# Patient Record
Sex: Female | Born: 1955
Health system: Southern US, Community
[De-identification: ages and names within clinical notes are randomized; demographics above are authoritative.]

## PROBLEM LIST (undated history)

## (undated) DIAGNOSIS — R5383 Other fatigue: Secondary | ICD-10-CM

## (undated) DIAGNOSIS — M8580 Other specified disorders of bone density and structure, unspecified site: Secondary | ICD-10-CM

## (undated) DIAGNOSIS — F329 Major depressive disorder, single episode, unspecified: Secondary | ICD-10-CM

## (undated) DIAGNOSIS — L509 Urticaria, unspecified: Secondary | ICD-10-CM

## (undated) DIAGNOSIS — F32A Depression, unspecified: Secondary | ICD-10-CM

## (undated) DIAGNOSIS — R0600 Dyspnea, unspecified: Secondary | ICD-10-CM

## (undated) DIAGNOSIS — J40 Bronchitis, not specified as acute or chronic: Secondary | ICD-10-CM

## (undated) DIAGNOSIS — F419 Anxiety disorder, unspecified: Secondary | ICD-10-CM

## (undated) DIAGNOSIS — J45909 Unspecified asthma, uncomplicated: Secondary | ICD-10-CM

## (undated) DIAGNOSIS — M169 Osteoarthritis of hip, unspecified: Secondary | ICD-10-CM

## (undated) DIAGNOSIS — B977 Papillomavirus as the cause of diseases classified elsewhere: Secondary | ICD-10-CM

## (undated) DIAGNOSIS — R490 Dysphonia: Secondary | ICD-10-CM

## (undated) DIAGNOSIS — R87810 Cervical high risk human papillomavirus (HPV) DNA test positive: Secondary | ICD-10-CM

## (undated) DIAGNOSIS — R87612 Low grade squamous intraepithelial lesion on cytologic smear of cervix (LGSIL): Secondary | ICD-10-CM

## (undated) DIAGNOSIS — R519 Headache, unspecified: Secondary | ICD-10-CM

## (undated) DIAGNOSIS — Z8489 Family history of other specified conditions: Secondary | ICD-10-CM

## (undated) DIAGNOSIS — C3490 Malignant neoplasm of unspecified part of unspecified bronchus or lung: Secondary | ICD-10-CM

## (undated) DIAGNOSIS — K219 Gastro-esophageal reflux disease without esophagitis: Secondary | ICD-10-CM

## (undated) DIAGNOSIS — J449 Chronic obstructive pulmonary disease, unspecified: Secondary | ICD-10-CM

## (undated) DIAGNOSIS — C801 Malignant (primary) neoplasm, unspecified: Secondary | ICD-10-CM

## (undated) DIAGNOSIS — Z85118 Personal history of other malignant neoplasm of bronchus and lung: Secondary | ICD-10-CM

## (undated) DIAGNOSIS — Z9221 Personal history of antineoplastic chemotherapy: Secondary | ICD-10-CM

## (undated) HISTORY — DX: Cervical high risk human papillomavirus (HPV) DNA test positive: R87.810

## (undated) HISTORY — DX: Unspecified asthma, uncomplicated: J45.909

## (undated) HISTORY — DX: Major depressive disorder, single episode, unspecified: F32.9

## (undated) HISTORY — DX: Malignant neoplasm of unspecified part of unspecified bronchus or lung: C34.90

## (undated) HISTORY — DX: Osteoarthritis of hip, unspecified: M16.9

## (undated) HISTORY — DX: Other specified disorders of bone density and structure, unspecified site: M85.80

## (undated) HISTORY — DX: Urticaria, unspecified: L50.9

## (undated) HISTORY — DX: Low grade squamous intraepithelial lesion on cytologic smear of cervix (LGSIL): R87.612

## (undated) HISTORY — DX: Depression, unspecified: F32.A

## (undated) HISTORY — DX: Papillomavirus as the cause of diseases classified elsewhere: B97.7

## (undated) HISTORY — DX: Personal history of other malignant neoplasm of bronchus and lung: Z85.118

## (undated) HISTORY — DX: Chronic obstructive pulmonary disease, unspecified: J44.9

---

## 1995-02-26 HISTORY — PX: ABDOMINAL HYSTERECTOMY: SHX81

## 1997-02-25 HISTORY — PX: CHOLECYSTECTOMY: SHX55

## 2000-09-04 ENCOUNTER — Other Ambulatory Visit: Admission: RE | Admit: 2000-09-04 | Discharge: 2000-09-04 | Payer: Self-pay | Admitting: Obstetrics and Gynecology

## 2000-09-12 ENCOUNTER — Encounter: Payer: Self-pay | Admitting: Obstetrics and Gynecology

## 2000-09-12 ENCOUNTER — Encounter: Admission: RE | Admit: 2000-09-12 | Discharge: 2000-09-12 | Payer: Self-pay | Admitting: Obstetrics and Gynecology

## 2001-09-22 ENCOUNTER — Encounter: Admission: RE | Admit: 2001-09-22 | Discharge: 2001-09-22 | Payer: Self-pay | Admitting: Obstetrics and Gynecology

## 2001-09-22 ENCOUNTER — Encounter: Payer: Self-pay | Admitting: Obstetrics and Gynecology

## 2002-02-25 DIAGNOSIS — C3490 Malignant neoplasm of unspecified part of unspecified bronchus or lung: Secondary | ICD-10-CM

## 2002-02-25 HISTORY — DX: Malignant neoplasm of unspecified part of unspecified bronchus or lung: C34.90

## 2002-02-25 HISTORY — PX: LUNG CANCER SURGERY: SHX702

## 2002-10-08 ENCOUNTER — Encounter: Admission: RE | Admit: 2002-10-08 | Discharge: 2002-10-08 | Payer: Self-pay | Admitting: Obstetrics and Gynecology

## 2002-10-08 ENCOUNTER — Encounter: Payer: Self-pay | Admitting: Obstetrics and Gynecology

## 2003-10-13 ENCOUNTER — Encounter: Admission: RE | Admit: 2003-10-13 | Discharge: 2003-10-13 | Payer: Self-pay | Admitting: Obstetrics and Gynecology

## 2004-10-18 ENCOUNTER — Encounter: Admission: RE | Admit: 2004-10-18 | Discharge: 2004-10-18 | Payer: Self-pay | Admitting: Obstetrics and Gynecology

## 2007-03-31 ENCOUNTER — Ambulatory Visit: Payer: Self-pay | Admitting: Gastroenterology

## 2007-03-31 LAB — HM COLONOSCOPY

## 2008-03-26 ENCOUNTER — Emergency Department: Payer: Self-pay | Admitting: Emergency Medicine

## 2010-04-17 ENCOUNTER — Emergency Department: Payer: Self-pay | Admitting: Emergency Medicine

## 2012-10-07 ENCOUNTER — Ambulatory Visit: Payer: Self-pay

## 2012-10-07 LAB — HM MAMMOGRAPHY

## 2012-10-13 LAB — HM PAP SMEAR

## 2014-06-16 ENCOUNTER — Other Ambulatory Visit: Payer: Self-pay | Admitting: Family Medicine

## 2014-06-16 DIAGNOSIS — M8580 Other specified disorders of bone density and structure, unspecified site: Secondary | ICD-10-CM

## 2014-06-16 DIAGNOSIS — N63 Unspecified lump in unspecified breast: Secondary | ICD-10-CM

## 2014-06-29 ENCOUNTER — Ambulatory Visit: Payer: Self-pay

## 2014-06-29 ENCOUNTER — Other Ambulatory Visit: Payer: Self-pay

## 2014-07-14 ENCOUNTER — Other Ambulatory Visit: Payer: Self-pay

## 2014-07-14 ENCOUNTER — Ambulatory Visit: Payer: Self-pay

## 2014-08-04 ENCOUNTER — Ambulatory Visit
Admission: RE | Admit: 2014-08-04 | Discharge: 2014-08-04 | Disposition: A | Discharge status: Home / Self Care | Payer: Managed Care, Other (non HMO) | Source: Ambulatory Visit | Attending: Family Medicine | Admitting: Family Medicine

## 2014-08-04 ENCOUNTER — Ambulatory Visit: Payer: Self-pay

## 2014-08-04 DIAGNOSIS — N63 Unspecified lump in unspecified breast: Secondary | ICD-10-CM

## 2014-08-04 DIAGNOSIS — M8580 Other specified disorders of bone density and structure, unspecified site: Secondary | ICD-10-CM | POA: Diagnosis present

## 2014-08-04 HISTORY — DX: Malignant (primary) neoplasm, unspecified: C80.1

## 2014-08-08 ENCOUNTER — Encounter: Payer: Self-pay | Admitting: Family Medicine

## 2014-08-08 DIAGNOSIS — M8580 Other specified disorders of bone density and structure, unspecified site: Secondary | ICD-10-CM | POA: Insufficient documentation

## 2014-08-09 ENCOUNTER — Telehealth: Payer: Self-pay | Admitting: Family Medicine

## 2014-08-09 NOTE — Telephone Encounter (Signed)
Please let [name] know that I'd like to see patient for an appointment here in the office for:  A clinical breast exam Please schedule a visit with me in 3 months Fasting?  no Thank you, Dr. Sanda Klein

## 2014-08-11 NOTE — Telephone Encounter (Signed)
I have left 2 vm to call back and schedule appt with Dr. Sanda Klein.

## 2014-11-16 DIAGNOSIS — Z85118 Personal history of other malignant neoplasm of bronchus and lung: Secondary | ICD-10-CM | POA: Insufficient documentation

## 2014-11-16 DIAGNOSIS — J449 Chronic obstructive pulmonary disease, unspecified: Secondary | ICD-10-CM | POA: Insufficient documentation

## 2014-11-16 DIAGNOSIS — F331 Major depressive disorder, recurrent, moderate: Secondary | ICD-10-CM | POA: Insufficient documentation

## 2014-11-16 DIAGNOSIS — J45909 Unspecified asthma, uncomplicated: Secondary | ICD-10-CM | POA: Insufficient documentation

## 2014-11-16 DIAGNOSIS — M169 Osteoarthritis of hip, unspecified: Secondary | ICD-10-CM | POA: Insufficient documentation

## 2014-11-16 DIAGNOSIS — L509 Urticaria, unspecified: Secondary | ICD-10-CM | POA: Insufficient documentation

## 2014-11-18 ENCOUNTER — Encounter: Payer: Self-pay | Admitting: Family Medicine

## 2014-11-18 ENCOUNTER — Ambulatory Visit (INDEPENDENT_AMBULATORY_CARE_PROVIDER_SITE_OTHER): Payer: Managed Care, Other (non HMO) | Admitting: Family Medicine

## 2014-11-18 VITALS — BP 147/82 | HR 80 | Temp 98.7°F | Ht 65.0 in | Wt 211.0 lb

## 2014-11-18 DIAGNOSIS — F321 Major depressive disorder, single episode, moderate: Secondary | ICD-10-CM

## 2014-11-18 DIAGNOSIS — M8580 Other specified disorders of bone density and structure, unspecified site: Secondary | ICD-10-CM | POA: Diagnosis not present

## 2014-11-18 DIAGNOSIS — E559 Vitamin D deficiency, unspecified: Secondary | ICD-10-CM | POA: Insufficient documentation

## 2014-11-18 DIAGNOSIS — Z23 Encounter for immunization: Secondary | ICD-10-CM | POA: Diagnosis not present

## 2014-11-18 DIAGNOSIS — L509 Urticaria, unspecified: Secondary | ICD-10-CM

## 2014-11-18 MED ORDER — ESCITALOPRAM OXALATE 10 MG PO TABS: 10.0000 mg | ORAL_TABLET | Freq: Every day | ORAL | Status: DC

## 2014-11-18 MED ORDER — TRAZODONE HCL 50 MG PO TABS: 50.0000 mg | ORAL_TABLET | Freq: Every evening | ORAL | Status: DC | PRN

## 2014-11-18 NOTE — Assessment & Plan Note (Signed)
Check vit D, TSH, vit B12; start medicine, start counseling, hand-out given from [facility]

## 2014-11-18 NOTE — Assessment & Plan Note (Signed)
On anti-histamine

## 2014-11-18 NOTE — Progress Notes (Signed)
BP 147/82 mmHg  Pulse 80  Temp(Src) 98.7 F (37.1 C)  Ht '5\' 5"'$  (1.651 m)  Wt 211 lb (95.709 kg)  BMI 35.11 kg/m2  SpO2 98%   Subjective:    Patient ID: Debra Mccann, female    DOB: Apr 14, 1955, 59 y.o.   MRN: [id number]  HPI: Debra Mccann is a 59 y.o. female  Chief Complaint  Patient presents with  . Depression  . Anxiety  . 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; working 50 hours a week; I asked about light at the end of the tunnel for the job and she says yes, they have gotten on to just one system, so some issues resolving but still stressful; she tries to stay a day ahead, worrying about the future; she is a Biochemist, clinical, worries about everything; she thinks there is someone at her work she can talk to about job stress She has never been on anything for depression or anxiety (medicine-wise) She feels like everything is falling down on her This is the first time she has felt like this No family history of depression No abuse at home No self-medicating No alcohol No constipation No well-meaning friends have been giving her things She eats chicken and fish, not much red meat Not much sunlight Hx of low vit D, actually had to take vit D Rx  Relevant past medical, surgical, family and social history reviewed and updated as indicated. Interim medical history since our last visit reviewed. Allergies and medications reviewed and updated.  Review of Systems Per HPI unless specifically indicated above     Objective:    BP 147/82 mmHg  Pulse 80  Temp(Src) 98.7 F (37.1 C)  Ht '5\' 5"'$  (1.651 m)  Wt 211 lb (95.709 kg)  BMI 35.11 kg/m2  SpO2 98%  Wt Readings from  Last 3 Encounters:  11/18/14 211 lb (95.709 kg)  06/10/14 210 lb (95.255 kg)    Physical Exam  Constitutional: She appears well-developed and well-nourished. No distress.  HENT:  Head: Normocephalic and atraumatic.  Eyes: EOM are normal. No scleral icterus.  Neck: No thyromegaly present.  Cardiovascular: Normal rate, regular rhythm and normal heart sounds.   No murmur heard. Pulmonary/Chest: Effort normal and breath sounds normal. No respiratory distress. She has no wheezes.  Abdominal: She exhibits no distension.  Musculoskeletal: Normal range of motion. She exhibits no edema.  Neurological: She is alert. She displays no tremor. She exhibits normal muscle tone.  Patellar DTRs trace  Skin: Skin is warm and dry. She is not diaphoretic. No pallor.  Psychiatric: Her speech is normal and behavior is normal. Judgment and thought content normal. Her mood appears anxious. Her affect is not blunt and not labile. Cognition and memory are normal. She exhibits a depressed mood.  Good eye contact; make-up (eyebrows), good hygiene, casually dressed   Results for orders placed or performed in visit on 11/16/14  HM MAMMOGRAPHY  Result Value Ref Range   HM Mammogram per PP   HM PAP SMEAR  Result Value Ref Range   HM Pap smear per PP   HM COLONOSCOPY  Result Value Ref Range  HM Colonoscopy per PP       Assessment & Plan:   Problem List Items Addressed This Visit      Musculoskeletal and Integument   Osteopenia    Check vit D; three servings of calcium a day, fall precautions      Relevant Orders   Vit D  25 hydroxy (rtn osteoporosis monitoring)   Urticaria    On anti-histamine        Other   Major depressive disorder, single episode, moderate with anxious distress - Primary    Check vit D, TSH, vit B12; start medicine, start counseling, hand-out given from [facility]      Relevant Medications   escitalopram (LEXAPRO) 10 MG tablet   traZODone (DESYREL) 50 MG tablet   Other  Relevant Orders   TSH   Vitamin B12   Vitamin D deficiency   Relevant Orders   Vit D  25 hydroxy (rtn osteoporosis monitoring)    Other Visit Diagnoses    Encounter for immunization          flu shot given today  Follow up plan: Return in about 3 weeks (around 12/09/2014) for medicine check-up.  An after-visit summary was printed and given to the patient at [place].  Please see the patient instructions which may contain other information and recommendations beyond what is mentioned above in the assessment and plan.  Meds ordered this encounter  Medications  . cetirizine (ZYRTEC) 10 MG tablet    Sig: Take 10 mg by mouth daily.  Marland Kitchen docusate sodium (COLACE) 100 MG capsule    Sig: Take 100 mg by mouth daily.  Marland Kitchen escitalopram (LEXAPRO) 10 MG tablet    Sig: Take 1 tablet (10 mg total) by mouth daily.    Dispense:  30 tablet    Refill:  0  . traZODone (DESYREL) 50 MG tablet    Sig: Take 1-1.5 tablets (50-75 mg total) by mouth at bedtime as needed for sleep.    Dispense:  45 tablet    Refill:  0   Orders Placed This Encounter  Procedures  . Flu Vaccine QUAD 36+ mos IM  . TSH  . Vit D  25 hydroxy (rtn osteoporosis monitoring)  . Vitamin B12   Face-to-face time with patient was more than 25 minutes, >50% time spent counseling and coordination of care

## 2014-11-18 NOTE — Patient Instructions (Addendum)
Check with your employer to see any sort of employee assistance or counseling If not, please schedule an appointment to start working with a therapist locally Start the escitalopram for your mood Start the trazodone for sleep Do try to walk or get outdoors and get some sunshine and fresh air Return in 3 weeks Call me before then if any dark thoughts, and seek help immediately if your mood gets severe

## 2014-11-18 NOTE — Assessment & Plan Note (Signed)
Check vit D; three servings of calcium a day, fall precautions

## 2014-11-19 ENCOUNTER — Encounter: Payer: Self-pay | Admitting: Family Medicine

## 2014-11-19 LAB — VITAMIN D 25 HYDROXY (VIT D DEFICIENCY, FRACTURES): VIT D 25 HYDROXY: 28 ng/mL — AB (ref 30.0–100.0)

## 2014-11-19 LAB — VITAMIN B12: Vitamin B-12: 490 pg/mL (ref 211–946)

## 2014-11-19 LAB — TSH: TSH: 1.56 u[IU]/mL (ref 0.450–4.500)

## 2014-11-23 ENCOUNTER — Telehealth: Payer: Self-pay

## 2014-11-23 MED ORDER — FLUTICASONE-SALMETEROL 500-50 MCG/DOSE IN AEPB: 1.0000 | INHALATION_SPRAY | Freq: Two times a day (BID) | RESPIRATORY_TRACT | Status: DC

## 2014-11-23 MED ORDER — ALBUTEROL SULFATE HFA 108 (90 BASE) MCG/ACT IN AERS: 2.0000 | INHALATION_SPRAY | RESPIRATORY_TRACT | Status: DC | PRN

## 2014-11-23 NOTE — Telephone Encounter (Signed)
She has been sick the last 3 days, diarrhea, nausea, headache, fluids are going right thru her. She thinks she's probably getting dehydrated, her urine is darker. She has been taking the depression and anxiety meds, but she took 1 of the sleep pills on Friday and felt bad on Saturday. She didn't take it Saturday night and felt fine on Sunday. Monday she woke up feeling bad again. Wants to know what to do.  Also, she needs a refill on Advair and a new rx for Albuterol inhaler, she said her's has expired.

## 2014-11-23 NOTE — Telephone Encounter (Signed)
She sounds dehydrated; encourage her to go to urgent care please today Don't take the sleeping pill (trazodone) if she feels worse She needs to be seen by a provider I don't want to refill breathing meds if she has a listed allergy to albuterol; please find out about that, and remove if not a true allergy and back to me for refill Explain please that inhaler of albuterol should not be taken by someone with an allergy to albuterol

## 2014-11-23 NOTE — Telephone Encounter (Signed)
Patient notified to go to urgent care. She will not take the Trazadone. She said they one time she had a reaction to it, it was the nebulizer form that Truman gave her in the office. She said she's taken the inhaler before with no problems.

## 2014-12-09 ENCOUNTER — Encounter: Payer: Self-pay | Admitting: Family Medicine

## 2014-12-09 ENCOUNTER — Ambulatory Visit (INDEPENDENT_AMBULATORY_CARE_PROVIDER_SITE_OTHER): Payer: Managed Care, Other (non HMO) | Admitting: Family Medicine

## 2014-12-09 VITALS — BP 124/84 | HR 87 | Wt 212.0 lb

## 2014-12-09 DIAGNOSIS — F321 Major depressive disorder, single episode, moderate: Secondary | ICD-10-CM

## 2014-12-09 DIAGNOSIS — Z720 Tobacco use: Secondary | ICD-10-CM

## 2014-12-09 MED ORDER — ESCITALOPRAM OXALATE 10 MG PO TABS: 10.0000 mg | ORAL_TABLET | Freq: Every day | ORAL | Status: DC

## 2014-12-09 NOTE — Assessment & Plan Note (Signed)
Doing much better; no evidence of mania or hypomania; refills provided; f/u in 6 months

## 2014-12-09 NOTE — Patient Instructions (Addendum)
Continue the medicine Let me know of any issues before your next appointment Do try to quit smoking   Smoking Cessation, Tips for Success If you are ready to quit smoking, congratulations! You have chosen to help yourself be healthier. Cigarettes bring nicotine, tar, carbon monoxide, and other irritants into your body. Your lungs, heart, and blood vessels will be able to work better without these poisons. There are many different ways to quit smoking. Nicotine gum, nicotine patches, a nicotine inhaler, or nicotine nasal spray can help with physical craving. Hypnosis, support groups, and medicines help break the habit of smoking. WHAT THINGS CAN I DO TO MAKE QUITTING EASIER?  Here are some tips to help you quit for good:  Pick a date when you will quit smoking completely. Tell all of your friends and family about your plan to quit on that date.  Do not try to slowly cut down on the number of cigarettes you are smoking. Pick a quit date and quit smoking completely starting on that day.  Throw away all cigarettes.   Clean and remove all ashtrays from your home, work, and car.  On a card, write down your reasons for quitting. Carry the card with you and read it when you get the urge to smoke.  Cleanse your body of nicotine. Drink enough water and fluids to keep your urine clear or pale yellow. Do this after quitting to flush the nicotine from your body.  Learn to predict your moods. Do not let a bad situation be your excuse to have a cigarette. Some situations in your life might tempt you into wanting a cigarette.  Never have "just one" cigarette. It leads to wanting another and another. Remind yourself of your decision to quit.  Change habits associated with smoking. If you smoked while driving or when feeling stressed, try other activities to replace smoking. Stand up when drinking your coffee. Brush your teeth after eating. Sit in a different chair when you read the paper. Avoid alcohol  while trying to quit, and try to drink fewer caffeinated beverages. Alcohol and caffeine may urge you to smoke.  Avoid foods and drinks that can trigger a desire to smoke, such as sugary or spicy foods and alcohol.  Ask people who smoke not to smoke around you.  Have something planned to do right after eating or having a cup of coffee. For example, plan to take a walk or exercise.  Try a relaxation exercise to calm you down and decrease your stress. Remember, you may be tense and nervous for the first 2 weeks after you quit, but this will pass.  Find new activities to keep your hands busy. Play with a pen, coin, or rubber band. Doodle or draw things on paper.  Brush your teeth right after eating. This will help cut down on the craving for the taste of tobacco after meals. You can also try mouthwash.   Use oral substitutes in place of cigarettes. Try using lemon drops, carrots, cinnamon sticks, or chewing gum. Keep them handy so they are available when you have the urge to smoke.  When you have the urge to smoke, try deep breathing.  Designate your home as a nonsmoking area.  If you are a heavy smoker, ask your health care provider about a prescription for nicotine chewing gum. It can ease your withdrawal from nicotine.  Reward yourself. Set aside the cigarette money you save and buy yourself something nice.  Look for support from others. Join  a support group or smoking cessation program. Ask someone at home or at work to help you with your plan to quit smoking.  Always ask yourself, "Do I need this cigarette or is this just a reflex?" Tell yourself, "Today, I choose not to smoke," or "I do not want to smoke." You are reminding yourself of your decision to quit.  Do not replace cigarette smoking with electronic cigarettes (commonly called e-cigarettes). The safety of e-cigarettes is unknown, and some may contain harmful chemicals.  If you relapse, do not give up! Plan ahead and think  about what you will do the next time you get the urge to smoke. HOW WILL I FEEL WHEN I QUIT SMOKING? You may have symptoms of withdrawal because your body is used to nicotine (the addictive substance in cigarettes). You may crave cigarettes, be irritable, feel very hungry, cough often, get headaches, or have difficulty concentrating. The withdrawal symptoms are only temporary. They are strongest when you first quit but will go away within 10-14 days. When withdrawal symptoms occur, stay in control. Think about your reasons for quitting. Remind yourself that these are signs that your body is healing and getting used to being without cigarettes. Remember that withdrawal symptoms are easier to treat than the major diseases that smoking can cause.  Even after the withdrawal is over, expect periodic urges to smoke. However, these cravings are generally short lived and will go away whether you smoke or not. Do not smoke! WHAT RESOURCES ARE AVAILABLE TO HELP ME QUIT SMOKING? Your health care provider can direct you to community resources or hospitals for support, which may include:  Group support.  Education.  Hypnosis.  Therapy.   This information is not intended to replace advice given to you by your health care provider. Make sure you discuss any questions you have with your health care provider.   Document Released: 11/10/2003 Document Revised: 03/04/2014 Document Reviewed: 07/30/2012 Elsevier Interactive Patient Education [employer].

## 2014-12-09 NOTE — Progress Notes (Signed)
BP 124/84 mmHg  Pulse 87  Wt 212 lb (96.163 kg)  SpO2 96%   Subjective:    Patient ID: Debra Mccann, female    DOB: [birth year], 59 y.o.   MRN: [id number]  HPI: Debra Mccann is a 59 y.o. female  Chief Complaint  Patient presents with  . Depression    follow up since starting the Escitalopram. It has been working good. She "loves that stuff!"   She is doing so much better since starting the SSRI; here for f/u; see last note Her co-workers have noticed, husband has noticed, grandchildren less irritating, daughter has noticed No bad side effects Going to sleep fine; wakes up every morning 1:30 to 2:00 am; nothing on her mind, wakes up for a just a bit, goes to the bathroom and then goes right back to bed Sleeping pill did not work well; goes to bed 10:00 or 10:30 pm; getting more rest Her PHQ-9 score improved significantly, from a 23 to a 10 since the last visit on Sept 23rd  Depression screen [facility] 2/9 12/09/2014 11/18/2014  Decreased Interest 3 3  Down, Depressed, Hopeless 0 3  PHQ - 2 Score 3 6  Altered sleeping 2 3  Tired, decreased energy 1 3  Change in appetite 3 3  Feeling bad or failure about yourself  0 3  Trouble concentrating 1 1  Moving slowly or fidgety/restless 0 3  Suicidal thoughts 0 1  PHQ-9 Score 10 23  Difficult doing work/chores Not difficult at all -   She is still smoking; husband is thinking about quitting; she says she'll quit cold [place] when she quits She received her flu shot already this year Relevant past medical, surgical, family and social history reviewed and updated as indicated. Interim medical history since our last visit reviewed. Allergies and medications reviewed and updated. Trazodone removed from med list.  Review of Systems Per HPI unless specifically indicated above     Objective:    BP 124/84 mmHg  Pulse 87  Wt 212 lb (96.163 kg)  SpO2 96%  Wt Readings from Last 3 Encounters:  12/09/14 212 lb (96.163 kg)   11/18/14 211 lb (95.709 kg)  06/10/14 210 lb (95.255 kg)    Physical Exam  Constitutional: She appears well-developed and well-nourished.  Eyes: EOM are normal.  Cardiovascular: Normal rate and regular rhythm.   Pulmonary/Chest: Effort normal and breath sounds normal.  Skin: Skin is dry. She is not diaphoretic. No pallor.  Psychiatric: She has a normal mood and affect. Her behavior is normal. Judgment and thought content normal. Her mood appears not anxious. Her speech is not rapid and/or pressured. Cognition and memory are normal. She does not exhibit a depressed mood.   Results for orders placed or performed in visit on 11/18/14  TSH  Result Value Ref Range   TSH 1.560 0.450 - 4.500 uIU/mL  Vit D  25 hydroxy (rtn osteoporosis monitoring)  Result Value Ref Range   Vit D, 25-Hydroxy 28.0 (L) 30.0 - 100.0 ng/mL  Vitamin B12  Result Value Ref Range   Vitamin B-12 490 211 - 946 pg/mL      Assessment & Plan:   Problem List Items Addressed This Visit      Other   Major depressive disorder, single episode, moderate with anxious distress ([place])    Doing much better; no evidence of mania or hypomania; refills provided; f/u in 6 months      Relevant Medications   escitalopram (LEXAPRO)  10 MG tablet   Tobacco abuse - Primary    She says when she is ready to quit, she'll try cold [place]; see AVS, tips for success given         Follow up plan: Return in about 6 months (around 06/09/2015) for mood.  Meds ordered this encounter  Medications  . escitalopram (LEXAPRO) 10 MG tablet    Sig: Take 1 tablet (10 mg total) by mouth daily.    Dispense:  30 tablet    Refill:  12

## 2014-12-09 NOTE — Assessment & Plan Note (Signed)
She says when she is ready to quit, she'll try cold [place]; see AVS, tips for success given

## 2014-12-28 ENCOUNTER — Ambulatory Visit: Payer: Managed Care, Other (non HMO) | Admitting: Unknown Physician Specialty

## 2014-12-28 ENCOUNTER — Telehealth: Payer: Self-pay | Admitting: Unknown Physician Specialty

## 2014-12-28 NOTE — Telephone Encounter (Signed)
Pt added to CW's schedule today @ 3:45pm for an acute visit. Thanks.

## 2014-12-29 ENCOUNTER — Ambulatory Visit (INDEPENDENT_AMBULATORY_CARE_PROVIDER_SITE_OTHER): Payer: Managed Care, Other (non HMO) | Admitting: Family Medicine

## 2014-12-29 ENCOUNTER — Encounter: Payer: Self-pay | Admitting: Family Medicine

## 2014-12-29 VITALS — BP 153/77 | HR 93 | Temp 98.8°F | Ht 64.5 in | Wt 211.0 lb

## 2014-12-29 DIAGNOSIS — J441 Chronic obstructive pulmonary disease with (acute) exacerbation: Secondary | ICD-10-CM | POA: Diagnosis not present

## 2014-12-29 MED ORDER — PREDNISONE 10 MG PO TABS
ORAL_TABLET | ORAL | Status: DC
Start: 2014-12-29 — End: 2015-01-08

## 2014-12-29 MED ORDER — BENZONATATE 200 MG PO CAPS: 200.0000 mg | ORAL_CAPSULE | Freq: Three times a day (TID) | ORAL | Status: DC | PRN

## 2014-12-29 MED ORDER — AZITHROMYCIN 250 MG PO TABS: ORAL_TABLET | ORAL | Status: DC

## 2014-12-29 NOTE — Progress Notes (Signed)
BP 153/77 mmHg  Pulse 93  Temp(Src) 98.8 F (37.1 C)  Ht 5' 4.5" (1.638 m)  Wt 211 lb (95.709 kg)  BMI 35.67 kg/m2  SpO2 90%   Subjective:    Patient ID: Debra Mccann, female    DOB: [birth year], 59 y.o.   MRN: [id number]  HPI: Debra Mccann is a 59 y.o. female  Chief Complaint  Patient presents with  . Urinary Tract Infection    x 4 DAYS, Shortness of breath, cough, nasal  and chest congestion   UPPER RESPIRATORY TRACT INFECTION Duration: x4 days Worst symptom: chest congestion Fever: no Cough: yes Shortness of breath: yes Wheezing: yes Chest pain: no Chest tightness: no Chest congestion: yes Nasal congestion: yes Runny nose: yes Post nasal drip: no Sneezing: no Sore throat: no Swollen glands: no Sinus pressure: yes Headache: yes Face pain: no Toothache: no Ear pain: no  Ear pressure: yes "right Eyes red/itching:no Eye drainage/crusting: no  Vomiting: no Rash: no Fatigue: yes Sick contacts: yes Strep contacts: no  Context: worse Recurrent sinusitis: no Relief with OTC cold/cough medications: no  Treatments attempted: none    Relevant past medical, surgical, family and social history reviewed and updated as indicated. Interim medical history since our last visit reviewed. Allergies and medications reviewed and updated.  Review of Systems  Constitutional: Negative.   HENT: Negative.   Respiratory: Positive for cough, chest tightness, shortness of breath and wheezing. Negative for apnea, choking and stridor.   Cardiovascular: Negative.   Psychiatric/Behavioral: Negative.     Per HPI unless specifically indicated above     Objective:    BP 153/77 mmHg  Pulse 93  Temp(Src) 98.8 F (37.1 C)  Ht 5' 4.5" (1.638 m)  Wt 211 lb (95.709 kg)  BMI 35.67 kg/m2  SpO2 90%  Wt Readings from Last 3 Encounters:  12/29/14 211 lb (95.709 kg)  12/09/14 212 lb (96.163 kg)  11/18/14 211 lb (95.709 kg)    Physical Exam  Constitutional: She is  oriented to person, place, and time. She appears well-developed and well-nourished. No distress.  HENT:  Head: Normocephalic and atraumatic.  Right Ear: Hearing and external ear normal.  Left Ear: Hearing and external ear normal.  Nose: Nose normal.  Mouth/Throat: Oropharynx is clear and moist. No oropharyngeal exudate.  Eyes: Conjunctivae, EOM and lids are normal. Pupils are equal, round, and reactive to light. Right eye exhibits no discharge. Left eye exhibits no discharge. No scleral icterus.  Cardiovascular: Normal rate, regular rhythm, normal heart sounds and intact distal pulses.  Exam reveals no gallop and no friction rub.   No murmur heard. Pulmonary/Chest: Effort normal. No respiratory distress. She has decreased breath sounds. She has wheezes in the right upper field, the right middle field, the right lower field, the left upper field, the left middle field and the left lower field. She has no rales. She exhibits no tenderness.  Musculoskeletal: Normal range of motion.  Neurological: She is alert and oriented to person, place, and time.  Skin: Skin is intact. No rash noted.  Psychiatric: She has a normal mood and affect. Her speech is normal and behavior is normal. Judgment and thought content normal. Cognition and memory are normal.  Nursing note and vitals reviewed.   Results for orders placed or performed in visit on 11/18/14  TSH  Result Value Ref Range   TSH 1.560 0.450 - 4.500 uIU/mL  Vit D  25 hydroxy (rtn osteoporosis monitoring)  Result Value Ref Range  Vit D, 25-Hydroxy 28.0 (L) 30.0 - 100.0 ng/mL  Vitamin B12  Result Value Ref Range   Vitamin B-12 490 211 - 946 pg/mL      Assessment & Plan:   Problem List Items Addressed This Visit    None    Visit Diagnoses    COPD exacerbation ([place])    -  Primary    Will treat with prednisone and z-pack and tessalon perles for comfort. Recheck lungs 2 weeks. Call if not getting better or getting worse.     Relevant  Medications    predniSONE (DELTASONE) 10 MG tablet    benzonatate (TESSALON) 200 MG capsule    azithromycin (ZITHROMAX) 250 MG tablet        Follow up plan: Return in about 2 weeks (around 01/12/2015) for Lung recheck.

## 2015-01-08 ENCOUNTER — Emergency Department: Payer: Managed Care, Other (non HMO)

## 2015-01-08 ENCOUNTER — Encounter: Payer: Self-pay | Admitting: *Deleted

## 2015-01-08 ENCOUNTER — Emergency Department
Admission: EM | Admit: 2015-01-08 | Discharge: 2015-01-08 | Disposition: A | Discharge status: Home / Self Care | Payer: Managed Care, Other (non HMO) | Attending: Emergency Medicine | Admitting: Emergency Medicine

## 2015-01-08 DIAGNOSIS — J441 Chronic obstructive pulmonary disease with (acute) exacerbation: Secondary | ICD-10-CM | POA: Insufficient documentation

## 2015-01-08 DIAGNOSIS — R05 Cough: Secondary | ICD-10-CM | POA: Diagnosis present

## 2015-01-08 DIAGNOSIS — Z79899 Other long term (current) drug therapy: Secondary | ICD-10-CM | POA: Diagnosis not present

## 2015-01-08 DIAGNOSIS — J209 Acute bronchitis, unspecified: Secondary | ICD-10-CM

## 2015-01-08 DIAGNOSIS — Z880 Allergy status to penicillin: Secondary | ICD-10-CM | POA: Diagnosis not present

## 2015-01-08 DIAGNOSIS — F17210 Nicotine dependence, cigarettes, uncomplicated: Secondary | ICD-10-CM | POA: Insufficient documentation

## 2015-01-08 DIAGNOSIS — J40 Bronchitis, not specified as acute or chronic: Secondary | ICD-10-CM

## 2015-01-08 DIAGNOSIS — Z7951 Long term (current) use of inhaled steroids: Secondary | ICD-10-CM | POA: Diagnosis not present

## 2015-01-08 HISTORY — DX: Bronchitis, not specified as acute or chronic: J40

## 2015-01-08 MED ORDER — IPRATROPIUM-ALBUTEROL 0.5-2.5 (3) MG/3ML IN SOLN
3.0000 mL | Freq: Once | RESPIRATORY_TRACT | Status: AC
  Administered 2015-01-08: 3 mL via RESPIRATORY_TRACT
  Filled 2015-01-08: qty 3

## 2015-01-08 MED ORDER — PROMETHAZINE-DM 6.25-15 MG/5ML PO SYRP: 5.0000 mL | ORAL_SOLUTION | Freq: Four times a day (QID) | ORAL | Status: DC | PRN

## 2015-01-08 MED ORDER — PREDNISONE 20 MG PO TABS: 40.0000 mg | ORAL_TABLET | Freq: Every day | ORAL | Status: DC

## 2015-01-08 MED ORDER — METHYLPREDNISOLONE SODIUM SUCC 125 MG IJ SOLR
125.0000 mg | Freq: Once | INTRAMUSCULAR | Status: AC
  Administered 2015-01-08: 125 mg via INTRAMUSCULAR
  Filled 2015-01-08: qty 2

## 2015-01-08 NOTE — ED Notes (Signed)
Pt reports having been dx with bronchitis by PCP on 11/3 and placed on prednisone and a zpack that she has finished. Pt reports feeling "better" than two weeks ago but continues to cough and feel weak. Pt reports right sided rib pain beginning yesterday that has moved to the right side. Pt is alert and orieNted x 4. Denies fever, NVD.

## 2015-01-08 NOTE — Discharge Instructions (Signed)

## 2015-01-08 NOTE — ED Provider Notes (Signed)
CSN: [id number]     Arrival date & time 01/08/15  1708 History   First MD Initiated Contact with Patient 01/08/15 1816     Chief Complaint  Patient presents with  . Cough     (Consider location/radiation/quality/duration/timing/severity/associated sxs/prior Treatment) HPI  59 year old female with a history of asthma and COPD presents to the emergency department for evaluation of cough and congestion for 2 weeks. Patient was treated 2 weeks ago on 12/29/2014 by PCP for bronchitis. She was treated with azithromycin and 6 day prednisone taper. Patient's cough has been persistent. She describes clear nasal congestion with no headaches or sinus pressure. She has mild sore throat with continued nonproductive cough. She has been using albuterol inhalers occasionally. She also has a spray of inhaler which she has not been using. Patient denies any fevers chest pain, she does complain of mild shortness of breath and wheezing.    Past Medical History  Diagnosis Date  . Cancer ([place])     lung cancer  . Asthma   . Osteopenia   . Depression   . COPD (chronic obstructive pulmonary disease) ([place])   . Urticaria   . OA (osteoarthritis) of hip     right  . Lung cancer ([place]) 2004  . Bronchitis    Past Surgical History  Procedure Laterality Date  . Cholecystectomy  1999  . Abdominal hysterectomy  1997    partial, only ovaries remain; due to heavy bleeding  . Lung cancer surgery Right 2004    removed "top portion of right lung"   Family History  Problem Relation Age of Onset  . CAD Mother     with stents  . Heart disease Mother   . Hypertension Mother   . Heart disease Father     3 vessel CABG  . Cancer Father     melanoma  . Hypertension Father   . Diabetes Neg Hx   . Stroke Neg Hx   . COPD Neg Hx    Social History  Substance Use Topics  . Smoking status: Current Every Day Smoker -- 0.50 packs/day for 23 years  . Smokeless tobacco: Never Used  . Alcohol Use: No   OB History    No data available     Review of Systems  Constitutional: Negative for fever, chills, activity change and fatigue.  HENT: Positive for congestion. Negative for sinus pressure and sore throat.   Eyes: Negative for visual disturbance.  Respiratory: Positive for cough and wheezing. Negative for chest tightness and shortness of breath.   Cardiovascular: Negative for chest pain and leg swelling.  Gastrointestinal: Negative for nausea, vomiting, abdominal pain and diarrhea.  Genitourinary: Negative for dysuria.  Musculoskeletal: Negative for arthralgias and gait problem.  Skin: Negative for rash.  Neurological: Negative for weakness, numbness and headaches.  Hematological: Negative for adenopathy.  Psychiatric/Behavioral: Negative for behavioral problems, confusion and agitation.      Allergies  Albuterol and Penicillins  Home Medications   Prior to Admission medications   Medication Sig Start Date End Date Taking? Authorizing Provider  albuterol (PROVENTIL HFA;VENTOLIN HFA) 108 (90 BASE) MCG/ACT inhaler Inhale 2 puffs into the lungs every 4 (four) hours as needed for wheezing or shortness of breath. 11/23/14   Arnetha Courser, MD  azithromycin (ZITHROMAX) 250 MG tablet 2 pills today, 1 each day after for 4 days 12/29/14   Megan P Johnson, DO  benzonatate (TESSALON) 200 MG capsule Take 1 capsule (200 mg total) by mouth 3 (three) times daily  as needed for cough. 12/29/14   Megan P Johnson, DO  cetirizine (ZYRTEC) 10 MG tablet Take 10 mg by mouth daily.    Historical Provider, MD  docusate sodium (COLACE) 100 MG capsule Take 100 mg by mouth daily.    Historical Provider, MD  escitalopram (LEXAPRO) 10 MG tablet Take 1 tablet (10 mg total) by mouth daily. 12/09/14   Arnetha Courser, MD  Fluticasone-Salmeterol (ADVAIR DISKUS) 500-50 MCG/DOSE AEPB Inhale 1 puff into the lungs 2 (two) times daily. 11/23/14   Arnetha Courser, MD  predniSONE (DELTASONE) 20 MG tablet Take 2 tablets (40 mg total) by mouth  daily. 01/08/15   Duanne Guess, PA-C  promethazine-dextromethorphan (PROMETHAZINE-DM) 6.25-15 MG/5ML syrup Take 5 mLs by mouth 4 (four) times daily as needed for cough. 01/08/15   Duanne Guess, PA-C   BP 132/93 mmHg  Pulse 84  Temp(Src) 98.2 F (36.8 C) (Oral)  Resp 16  Ht '5\' 5"'$  (1.651 m)  Wt 210 lb (95.255 kg)  BMI 34.95 kg/m2  SpO2 94% Physical Exam  Constitutional: She is oriented to person, place, and time. She appears well-developed and well-nourished. No distress.  HENT:  Head: Normocephalic and atraumatic.  Mouth/Throat: Oropharynx is clear and moist.  Eyes: EOM are normal. Pupils are equal, round, and reactive to light. Right eye exhibits no discharge. Left eye exhibits no discharge.  Neck: Normal range of motion. Neck supple.  Cardiovascular: Normal rate, regular rhythm and intact distal pulses.   Pulmonary/Chest: Effort normal. No respiratory distress. She has wheezes. She exhibits no tenderness.  Abdominal: Soft. She exhibits no distension. There is no tenderness.  Musculoskeletal: Normal range of motion. She exhibits no edema.  Neurological: She is alert and oriented to person, place, and time. She has normal reflexes.  Skin: Skin is warm and dry.  Psychiatric: She has a normal mood and affect. Her behavior is normal. Thought content normal.    ED Course  Procedures (including critical care time) Labs Review Labs Reviewed - No data to display  Imaging Review Dg Chest 2 View  01/08/2015  CLINICAL DATA:  Persistent cough and weakness despite finishing antibiotic course and prednisone for bronchitis. Right-sided rib pain beginning yesterday EXAM: CHEST  2 VIEW COMPARISON:  None. FINDINGS: Lungs are adequately inflated without consolidation or effusion. There are postsurgical changes over the right thorax compatible with history of lung cancer. Cardiomediastinal silhouette is within normal. Minimal degenerate change of the spine. IMPRESSION: No acute cardiopulmonary  disease. Postsurgical change of the right lung and hemithorax compatible with history of lung cancer. Electronically Signed   By: Marin Olp M.D.   On: 01/08/2015 18:10   I have personally reviewed and evaluated these images and lab results as part of my medical decision-making.   EKG Interpretation None      MDM   Final diagnoses:  Bronchitis  Bronchospasm with bronchitis, acute   59 year old female with cough and congestion for 2 weeks. Initially treated with antibiotics, continues to have persistent symptoms. On exam found to have tightness, wheezing and decreased air movement. Chest x-ray was negative. She was given 2 DuoNeb treatments with 125 mg of Solu-Medrol IM. At time of discharge patient had significantly improved breath sounds with no wheezing and good air movement. Vital signs are stable. She is given prednisone 40 mg daily 5 days. She will continue with albuterol inhalers at home. She will also return to using her Spiriva inhaler. Return to the ER for any worsening symptoms or urgent  changes in health.    Duanne Guess, PA-C 01/08/15 [place], MD 01/08/15 2024

## 2015-01-30 ENCOUNTER — Ambulatory Visit (INDEPENDENT_AMBULATORY_CARE_PROVIDER_SITE_OTHER): Payer: Managed Care, Other (non HMO) | Admitting: Family Medicine

## 2015-01-30 ENCOUNTER — Encounter: Payer: Self-pay | Admitting: Family Medicine

## 2015-01-30 ENCOUNTER — Ambulatory Visit: Payer: Managed Care, Other (non HMO) | Admitting: Family Medicine

## 2015-01-30 VITALS — BP 122/83 | HR 95 | Temp 97.8°F | Wt 209.0 lb

## 2015-01-30 DIAGNOSIS — J449 Chronic obstructive pulmonary disease, unspecified: Secondary | ICD-10-CM | POA: Insufficient documentation

## 2015-01-30 DIAGNOSIS — Z720 Tobacco use: Secondary | ICD-10-CM | POA: Diagnosis not present

## 2015-01-30 DIAGNOSIS — J438 Other emphysema: Secondary | ICD-10-CM | POA: Diagnosis not present

## 2015-01-30 DIAGNOSIS — R61 Generalized hyperhidrosis: Secondary | ICD-10-CM | POA: Diagnosis not present

## 2015-01-30 DIAGNOSIS — Z85118 Personal history of other malignant neoplasm of bronchus and lung: Secondary | ICD-10-CM | POA: Diagnosis not present

## 2015-01-30 DIAGNOSIS — R059 Cough, unspecified: Secondary | ICD-10-CM | POA: Insufficient documentation

## 2015-01-30 DIAGNOSIS — J439 Emphysema, unspecified: Secondary | ICD-10-CM | POA: Diagnosis not present

## 2015-01-30 DIAGNOSIS — R05 Cough: Secondary | ICD-10-CM

## 2015-01-30 DIAGNOSIS — R053 Chronic cough: Secondary | ICD-10-CM | POA: Insufficient documentation

## 2015-01-30 LAB — CBC WITH DIFFERENTIAL/PLATELET
HEMOGLOBIN: 14.5 g/dL (ref 11.1–15.9)
Hematocrit: 42.8 % (ref 34.0–46.6)
Lymphocytes Absolute: 0.9 10*3/uL (ref 0.7–3.1)
Lymphs: 22 %
MCH: 29.5 pg (ref 26.6–33.0)
MCHC: 33.9 g/dL (ref 31.5–35.7)
MCV: 87 fL (ref 79–97)
MID (Absolute): 0.6 10*3/uL (ref 0.1–1.6)
MID: 15 %
NEUTROS ABS: 2.4 10*3/uL (ref 1.4–7.0)
Neutrophils: 63 %
Platelets: 222 10*3/uL (ref 150–379)
RBC: 4.92 x10E6/uL (ref 3.77–5.28)
RDW: 14.4 % (ref 12.3–15.4)
WBC: 3.9 10*3/uL (ref 3.4–10.8)

## 2015-01-30 NOTE — Assessment & Plan Note (Addendum)
>>  ASSESSMENT AND PLAN FOR COPD (CHRONIC OBSTRUCTIVE PULMONARY DISEASE) (HCC) WRITTEN ON 01/30/2015  1:37 PM BY ,  P, MD  Check spirometry today; not taking Advair correctly, explained that type of medicine needs to be done daily even when not sick; albuterol rescue inhaler is for short-term  >>ASSESSMENT AND PLAN FOR EMPHYSEMA/COPD (HCC) WRITTEN ON 02/04/2015  5:18 PM BY ,  P, MD  Noted on old CT scan, reviewed with patient; start back on Advair regularly, not PRN; unfortunately, she continues to smoke and is not ready to quit

## 2015-01-30 NOTE — Assessment & Plan Note (Signed)
With recent illness, not improving, abnormal breath sounds right side posterior lung fields; will get chest CT

## 2015-01-30 NOTE — Progress Notes (Signed)
BP 122/83 mmHg  Pulse 95  Temp(Src) 97.8 F (36.6 C)  Wt 209 lb (94.802 kg)  SpO2 95%   Subjective:    Patient ID: Debra Mccann, female    DOB: [birth year], 59 y.o.   MRN: [id number]  HPI: Debra Mccann is a 59 y.o. female  Chief Complaint  Patient presents with  . URI    She has been sick for about a month now. Has been seen here, the ED, and [facility]. Has been on multiple rounds of Prednisone and antibiotics.   She came here and saw Dr. Wynetta Emery November 3rd or 4th; was diagnosed with bronchitis, treated with z-pak and prednisone; finished meds and not better, actually worse; she went to the ER because she was having so much discomfort, right first then left;  She has had two chest xrays; has had breathing treatments, steroid shot in the ER plus the oral steroids; they did not give her ABX in the ER, but she was told to follow-up with [facility] clinic if she wasn't any better They did a CXR there and antibiotics; she does not think any of them have helped, but she isn't sure That was doxycycline but not finished yet, filled 01/12/15; still has one left; she actually stopped them on her own; got into day 5 or 6 and then stopped it for 2-3 days and then back on the antibiotics when her mother encouraged her to get back on them The cough is real deep; getting mucous up, but harder to get it up; not as bad as it was; coughs strongly and throws up or gags She has not had any exposure to whooping cough, no travel No fevers No change in the home environment; spraying lysol in the air vents, nothing at work going on; her husband had bronchitis after she did, but he had similar symptoms and got better She is a smoker, less than a pack a day, not smoking much  She has quit before for 7-8 years; stress made her pick her back up First cigarette of the day is a while, hours after waking; last cig of the day is after dinner, and is a few hours before bedtime Has Advair 500/50 but  not using regularly and disc actually expired 09/2014; also has Proair exp 06/2016 (still good)  Reviewed CXR done 01/08/15, copied and pasted impression below: IMPRESSION: No acute cardiopulmonary disease. Postsurgical change of the right lung and hemithorax compatible with history of lung cancer.  Relevant past medical, surgical, family and social history reviewed and updated as indicated. Past Medical History  Diagnosis Date  . Cancer ([place])     lung cancer  . Asthma   . Osteopenia   . Depression   . COPD (chronic obstructive pulmonary disease) ([place])   . Urticaria   . OA (osteoarthritis) of hip     right  . Lung cancer ([place]) 2004  . Bronchitis    Past Surgical History  Procedure Laterality Date  . Cholecystectomy  1999  . Abdominal hysterectomy  1997    partial, only ovaries remain; due to heavy bleeding  . Lung cancer surgery Right 2004    removed "top portion of right lung"   Social History  Substance Use Topics  . Smoking status: Current Every Day Smoker -- 0.50 packs/day for 23 years  . Smokeless tobacco: Never Used  . Alcohol Use: No   Interim medical history since our last visit reviewed.  Allergies and medications reviewed and updated. She  is not "allergic" to albuterol and has been using, actually Allergies  Allergen Reactions  . Albuterol Diarrhea, Nausea And Vomiting and Other (See Comments)    Shaking, vomiting, diarrhea  . Penicillins    Review of Systems  Constitutional: Negative for fever and chills.       Night sweats just recently  HENT: Negative for sore throat.   Skin: Negative for rash.  Per HPI unless specifically indicated above     Objective:    BP 122/83 mmHg  Pulse 95  Temp(Src) 97.8 F (36.6 C)  Wt 209 lb (94.802 kg)  SpO2 95%  Wt Readings from Last 3 Encounters:  01/30/15 209 lb (94.802 kg)  01/08/15 210 lb (95.255 kg)  12/29/14 211 lb (95.709 kg)    Physical Exam  Constitutional: She appears well-developed and  well-nourished. No distress (but appears to not feel well; nontoxic).  HENT:  Head: Normocephalic and atraumatic.  Nose: Nose normal.  Mouth/Throat: No oropharyngeal exudate.  Eyes: EOM are normal. No scleral icterus.  Neck: No JVD present. No thyromegaly present.  Cardiovascular: Normal rate and regular rhythm.   Pulmonary/Chest: Effort normal. She has wheezes (low-pitched sonorous wheeze on expiration in the right posterior mid-lung region; did NOT clear with cough).  Abdominal: She exhibits no distension.  Lymphadenopathy:    She has no cervical adenopathy.  Skin: Skin is warm and dry. No rash noted. She is not diaphoretic. No pallor. Nails show no clubbing.  Psychiatric: She has a normal mood and affect. Her behavior is normal. Judgment and thought content normal.   CBC in-house: WBC 3.9k, no left shift Hgb 14.5, Hct 42.8 Platelets 222   Reviewed the CXR report from November 2016; no acute cardiopulmonary disease I was unable to find the 2nd CXR report from November 2016 in [place] the chest CT from 2011 with patient; showed evidence of emphysematous changes back then     Assessment & Plan:   Problem List Items Addressed This Visit      Respiratory   COPD (chronic obstructive pulmonary disease) ([place]) - Primary    Check spirometry today; not taking Advair correctly, explained that type of medicine needs to be done daily even when not sick; albuterol rescue inhaler is for short-term      Relevant Orders   Spirometry with Graph (Completed)   Ambulatory referral to Pulmonology   Spirometry with Graph (Completed)   Quantiferon tb gold assay (blood) (Completed)   Ambulatory referral to Pulmonology   Emphysema/COPD [facility])    Noted on old CT scan, reviewed with patient; start back on Advair regularly, not PRN; unfortunately, she continues to smoke and is not ready to quit      Relevant Orders   Spirometry with Graph (Completed)   Ambulatory referral to  Pulmonology   Spirometry with Graph (Completed)   Quantiferon tb gold assay (blood) (Completed)   Ambulatory referral to Pulmonology     Other   History of lung cancer    With recent illness, not improving, abnormal breath sounds right side posterior lung fields; will get chest CT      Relevant Orders   CBC With Differential/Platelet (Completed)   Comprehensive metabolic panel (Completed)   CT Chest W Contrast   Ambulatory referral to Pulmonology   Tobacco abuse    Patient continues to smoke and is not ready to quit; I am here to help if and when that day comes that she wants to give up her cigarettes  Night sweats    History of lung cancer; get chest CT, check quantiferon gold, refer to pulmonologist      Relevant Orders   CBC With Differential/Platelet (Completed)   Quantiferon tb gold assay (blood) (Completed)   CT Chest W Contrast   Ambulatory referral to Pulmonology   Cough    Persistent; abnormal breath sounds; recent CXR; hx of lung cancer; will get chest CT and refer to pulmolonogist      Relevant Orders   CT Chest W Contrast   Ambulatory referral to Pulmonology      Follow up plan: Return in about 2 weeks (around 02/13/2015) for for breathing if needed.  An after-visit summary was printed and given to the patient at [place].  Please see the patient instructions which may contain other information and recommendations beyond what is mentioned above in the assessment and plan. Orders Placed This Encounter  Procedures  . CT Chest W Contrast  . CBC With Differential/Platelet  . Comprehensive metabolic panel  . Quantiferon tb gold assay (blood)  . QuantiFERON In Tube  . Ambulatory referral to Pulmonology  . Spirometry with Graph   Face-to-face time with patient was more than 25 minutes, >50% time spent counseling and coordination of care ------------------------ Labs done this visit and most recently:  Lab Results  Component Value Date   WBC 3.9  01/30/2015   HCT 42.8 01/30/2015   GLUCOSE 84 01/30/2015   ALT 9 01/30/2015   AST 12 01/30/2015   NA 142 01/30/2015   K 4.5 01/30/2015   CL 102 01/30/2015   CREATININE 0.72 01/30/2015   BUN 7 01/30/2015   CO2 27 01/30/2015   TSH 1.560 11/18/2014

## 2015-01-30 NOTE — Patient Instructions (Addendum)
We'll have you see the pulmonologist (lung doctor) Try the tessalon perles Try vitamin C (orange juice if not diabetic or vitamin C tablets) and drink green tea to help your immune system during your illness Get plenty of rest and hydration Start back on the Advair, one puff twice a day every day (twelve hours apart) Use the Proair for rescue (acute coughing, wheezing, etc) We'll call you about the labs and chest CT If you have not heard anything from my staff in a week about any orders/referrals/studies from today, please contact us here to follow-up [phone] Okay to go to work without restrictions

## 2015-01-31 ENCOUNTER — Ambulatory Visit: Payer: Managed Care, Other (non HMO) | Admitting: Family Medicine

## 2015-01-31 LAB — COMPREHENSIVE METABOLIC PANEL
A/G RATIO: 1.6 (ref 1.1–2.5)
ALK PHOS: 75 IU/L (ref 39–117)
ALT: 9 IU/L (ref 0–32)
AST: 12 IU/L (ref 0–40)
Albumin: 3.6 g/dL (ref 3.5–5.5)
BUN/Creatinine Ratio: 10 (ref 9–23)
BUN: 7 mg/dL (ref 6–24)
Bilirubin Total: 0.6 mg/dL (ref 0.0–1.2)
CALCIUM: 8.9 mg/dL (ref 8.7–10.2)
CO2: 27 mmol/L (ref 18–29)
CREATININE: 0.72 mg/dL (ref 0.57–1.00)
Chloride: 102 mmol/L (ref 97–106)
GFR calc Af Amer: 108 mL/min/{1.73_m2} (ref >59)
GFR, EST NON AFRICAN AMERICAN: 93 mL/min/{1.73_m2} (ref >59)
Globulin, Total: 2.2 g/dL (ref 1.5–4.5)
Glucose: 84 mg/dL (ref 65–99)
POTASSIUM: 4.5 mmol/L (ref 3.5–5.2)
Sodium: 142 mmol/L (ref 136–144)
Total Protein: 5.8 g/dL — ABNORMAL LOW (ref 6.0–8.5)

## 2015-02-02 LAB — QUANTIFERON IN TUBE
QFT TB AG MINUS NIL VALUE: <0 IU/mL
QUANTIFERON MITOGEN VALUE: 4.44 [IU]/mL
QUANTIFERON NIL VALUE: 0.11 [IU]/mL
QUANTIFERON TB AG VALUE: 0.1 IU/mL
QUANTIFERON TB GOLD: NEGATIVE

## 2015-02-02 LAB — QUANTIFERON TB GOLD ASSAY (BLOOD)

## 2015-02-04 NOTE — Assessment & Plan Note (Signed)
History of lung cancer; get chest CT, check quantiferon gold, refer to pulmonologist

## 2015-02-04 NOTE — Assessment & Plan Note (Signed)
Noted on old CT scan, reviewed with patient; start back on Advair regularly, not PRN; unfortunately, she continues to smoke and is not ready to quit

## 2015-02-04 NOTE — Assessment & Plan Note (Signed)
Persistent; abnormal breath sounds; recent CXR; hx of lung cancer; will get chest CT and refer to pulmolonogist

## 2015-02-04 NOTE — Assessment & Plan Note (Signed)
Patient continues to smoke and is not ready to quit; I am here to help if and when that day comes that she wants to give up her cigarettes

## 2015-02-13 ENCOUNTER — Ambulatory Visit: Payer: Managed Care, Other (non HMO) | Admitting: Family Medicine

## 2015-02-13 ENCOUNTER — Ambulatory Visit
Admission: RE | Admit: 2015-02-13 | Discharge: 2015-02-13 | Disposition: A | Discharge status: Home / Self Care | Payer: Managed Care, Other (non HMO) | Source: Ambulatory Visit | Attending: Family Medicine | Admitting: Family Medicine

## 2015-02-13 DIAGNOSIS — Z902 Acquired absence of lung [part of]: Secondary | ICD-10-CM | POA: Insufficient documentation

## 2015-02-13 DIAGNOSIS — R61 Generalized hyperhidrosis: Secondary | ICD-10-CM | POA: Diagnosis not present

## 2015-02-13 DIAGNOSIS — Z87891 Personal history of nicotine dependence: Secondary | ICD-10-CM | POA: Insufficient documentation

## 2015-02-13 DIAGNOSIS — R05 Cough: Secondary | ICD-10-CM | POA: Insufficient documentation

## 2015-02-13 DIAGNOSIS — K449 Diaphragmatic hernia without obstruction or gangrene: Secondary | ICD-10-CM | POA: Diagnosis not present

## 2015-02-13 DIAGNOSIS — Z85118 Personal history of other malignant neoplasm of bronchus and lung: Secondary | ICD-10-CM | POA: Insufficient documentation

## 2015-02-13 MED ORDER — IOHEXOL 350 MG/ML SOLN
75.0000 mL | Freq: Once | INTRAVENOUS | Status: AC | PRN
  Administered 2015-02-13: 75 mL via INTRAVENOUS

## 2015-02-15 ENCOUNTER — Telehealth: Payer: Self-pay

## 2015-02-15 MED ORDER — BENZONATATE 100 MG PO CAPS: 100.0000 mg | ORAL_CAPSULE | Freq: Three times a day (TID) | ORAL | Status: DC | PRN

## 2015-02-15 MED ORDER — TIOTROPIUM BROMIDE MONOHYDRATE 18 MCG IN CAPS: 18.0000 ug | ORAL_CAPSULE | Freq: Every day | RESPIRATORY_TRACT | Status: DC

## 2015-02-15 NOTE — Telephone Encounter (Signed)
I talked with patient; explained no acute process Suggested Spiriva; Rx sent and she'll run that by her pulmonologist She's still having dicsomfort across the upper abd, ribs, diaphragm maybe from coughing so much; asked for refill of tessalon perles because those do help Explained we'll want to work that up if not better;  Come in Friday 8:15 am December 30th; she could not come in tomorrow or Friday ---------------------------- TERESA Please pt patient on schedule for Friday Dec 20th at 8:15 am, 15 minutes abd pain; she is aware; thank you

## 2015-02-15 NOTE — Telephone Encounter (Signed)
I apologize; yes, Dec 30th! Thank you for being thorough.

## 2015-02-15 NOTE — Telephone Encounter (Signed)
Dr. Sanda Klein I am confuse about your request. Please clarify, do you mean Friday Dec. 30th?

## 2015-02-15 NOTE — Telephone Encounter (Signed)
She is wanting to know her CT results as soon as possible. She is very worried.

## 2015-02-17 NOTE — Telephone Encounter (Signed)
Schedule appointment for 02/24/15 @ 8:15

## 2015-02-24 ENCOUNTER — Ambulatory Visit: Payer: Managed Care, Other (non HMO) | Admitting: Family Medicine

## 2015-05-15 ENCOUNTER — Other Ambulatory Visit: Payer: Self-pay | Admitting: Family Medicine

## 2015-06-08 ENCOUNTER — Other Ambulatory Visit: Payer: Self-pay | Admitting: Family Medicine

## 2015-06-08 ENCOUNTER — Ambulatory Visit (INDEPENDENT_AMBULATORY_CARE_PROVIDER_SITE_OTHER): Payer: Managed Care, Other (non HMO) | Admitting: Family Medicine

## 2015-06-08 ENCOUNTER — Encounter: Payer: Self-pay | Admitting: Family Medicine

## 2015-06-08 VITALS — BP 126/84 | HR 58 | Temp 98.1°F | Resp 14 | Wt 208.0 lb

## 2015-06-08 DIAGNOSIS — F321 Major depressive disorder, single episode, moderate: Secondary | ICD-10-CM

## 2015-06-08 DIAGNOSIS — R8290 Unspecified abnormal findings in urine: Secondary | ICD-10-CM

## 2015-06-08 DIAGNOSIS — Z85118 Personal history of other malignant neoplasm of bronchus and lung: Secondary | ICD-10-CM

## 2015-06-08 DIAGNOSIS — R05 Cough: Secondary | ICD-10-CM | POA: Diagnosis not present

## 2015-06-08 DIAGNOSIS — R059 Cough, unspecified: Secondary | ICD-10-CM

## 2015-06-08 DIAGNOSIS — R61 Generalized hyperhidrosis: Secondary | ICD-10-CM

## 2015-06-08 DIAGNOSIS — R531 Weakness: Secondary | ICD-10-CM | POA: Diagnosis not present

## 2015-06-08 DIAGNOSIS — Z720 Tobacco use: Secondary | ICD-10-CM | POA: Diagnosis not present

## 2015-06-08 DIAGNOSIS — E559 Vitamin D deficiency, unspecified: Secondary | ICD-10-CM | POA: Diagnosis not present

## 2015-06-08 MED ORDER — ESCITALOPRAM OXALATE 20 MG PO TABS: 20.0000 mg | ORAL_TABLET | Freq: Every day | ORAL | Status: DC

## 2015-06-08 NOTE — Assessment & Plan Note (Signed)
Seeing pulmonologist; had TB test, chest xray and chest CT; smoking cessation urged

## 2015-06-08 NOTE — Assessment & Plan Note (Signed)
Continue supplementation; check level today

## 2015-06-08 NOTE — Progress Notes (Signed)
BP 126/84 mmHg  Pulse 58  Temp(Src) 98.1 F (36.7 C) (Oral)  Resp 14  Wt 208 lb (94.348 kg)  SpO2 97%   Subjective:    Patient ID: Debra Mccann, female    DOB: April 08, 1955, 60 y.o.   MRN: [id number]  HPI: Debra Mccann is a 60 y.o. female  Chief Complaint  Patient presents with  . Medication Refill  . Depression    has been having more anxiety wants to see about uping dosage.  sleeping alot, having no energy  . COPD    cough, and wheezing see's pulmonolgy has f/u appt in june    Depression screen [facility] 2/9 06/08/2015 12/09/2014 11/18/2014  Decreased Interest '1 3 3  '$ Down, Depressed, Hopeless 1 0 3  PHQ - 2 Score '2 3 6  '$ Altered sleeping '3 2 3  '$ Tired, decreased energy '3 1 3  '$ Change in appetite '1 3 3  '$ Feeling bad or failure about yourself  0 0 3  Trouble concentrating 0 1 1  Moving slowly or fidgety/restless 0 0 3  Suicidal thoughts 0 0 1  PHQ-9 Score '9 10 23  '$ Difficult doing work/chores Very difficult Not difficult at all -   She says she is sleeping a lot; slept 33 hours Sunday night to Tuesday morning, just got up to go to the restroom; could not get out of bed to go to work; not sure if depressed or anxiety; just slept all day and all night; when she got up this Tuesday, she hadn't had anything to eat or drink; she got up and was getting ready for work and went to drink coffee and got nauseated, then clammy; her husband is an ex-EMT; she did not go to the hospital or urgent care; nauseated, just drenched; laid down for 15 or 20 minutes; had to go to [place] for work that day; got better as the day went on; today she is "feeling great"; got up this am and is doing fine; two weeks ago, had a stomach virus going around, missed 3 days of work and "it zapped me" she says; she says grandchildren are great, home life is great; she says work is the stressful part; she can vent to her husband, he lets her; we discussed some strategies for venting, defusing at the end of the  day; she is having some night sweats, dual-heated blanked on her husband's side of the bed; no hot flashes during the day; partial hysterectomy, still has ovaries; no unexplained weight loss; not eating right; eats out a lot, not right  Taking 2000 iu vit D daily and multiple vitamin, taking B12, vitamin C, stool softener and zyrtec  She still smoking, <1 ppd; saw pulmonologist; had chest CT, TB test, etc. CT Chest W Contrast   Status: Final result       PACS Images     Show images for CT Chest W Contrast     Study Result     CLINICAL DATA: Cough for 1 month. Night sweats. History of right lung cancer in 2004 with right upper lobectomy and chemotherapy. Smoker for 30 years.  EXAM: CT CHEST WITH CONTRAST  TECHNIQUE: Multidetector CT imaging of the chest was performed during intravenous contrast administration.  CONTRAST: 92m OMNIPAQUE IOHEXOL 350 MG/ML SOLN  COMPARISON: Chest radiograph of 01/08/2015. CT of 03/09/2003; report only available.  FINDINGS: Mediastinum/Nodes: Small left supraclavicular nodes are not pathologic by size criteria. Normal heart size, without pericardial effusion. No central pulmonary embolism, on  this non-dedicated study. No mediastinal or hilar adenopathy. Tiny hiatal hernia.  Lungs/Pleura: No pleural fluid. Status post right upper lobectomy. Mild centrilobular emphysema.  Clear lungs.  Upper abdomen: Cholecystectomy. Normal imaged portions of the liver, spleen, pancreas, adrenal glands, kidneys.  Musculoskeletal: Right-sided thoracotomy changes. Anterior lateral left eighth and ninth rib nonacute fractures.  IMPRESSION: 1. Status post right upper lobectomy. No acute process in the chest or explanation for cough and night sweats. 2. Tiny hiatal hernia.   Electronically Signed  By: Abigail Miyamoto M.D.  On: 02/13/2015 08:50   Relevant past medical, surgical, family and social history reviewed and updated as  indicated. Past Medical History  Diagnosis Date  . Asthma   . Osteopenia   . Depression   . COPD (chronic obstructive pulmonary disease) ([place])   . Urticaria   . OA (osteoarthritis) of hip     right  . Bronchitis   . Cancer ([place])     lung cancer  . Lung cancer ([place]) 2004   Past Surgical History  Procedure Laterality Date  . Cholecystectomy  1999  . Abdominal hysterectomy  1997    partial, only ovaries remain; due to heavy bleeding  . Lung cancer surgery Right 2004    removed "top portion of right lung"   Family History  Problem Relation Age of Onset  . CAD Mother     with stents  . Heart disease Mother   . Hypertension Mother   . Heart disease Father     3 vessel CABG  . Cancer Father     melanoma  . Hypertension Father   . Diabetes Neg Hx   . Stroke Neg Hx   . COPD Neg Hx    Social History  Substance Use Topics  . Smoking status: Current Every Day Smoker -- 0.50 packs/day for 23 years  . Smokeless tobacco: Never Used  . Alcohol Use: No   Interim medical history since our last visit reviewed. Allergies and medications reviewed, listed as albuterol and PCN  Review of Systems  Constitutional: Negative for unexpected weight change.  Gastrointestinal: Negative for diarrhea (now).  Per HPI unless specifically indicated above     Objective:    BP 126/84 mmHg  Pulse 58  Temp(Src) 98.1 F (36.7 C) (Oral)  Resp 14  Wt 208 lb (94.348 kg)  SpO2 97%  Wt Readings from Last 3 Encounters:  06/08/15 208 lb (94.348 kg)  01/30/15 209 lb (94.802 kg)  01/08/15 210 lb (95.255 kg)    Physical Exam  Constitutional: She appears well-developed and well-nourished. No distress (but appears to not feel well; nontoxic).  HENT:  Head: Normocephalic and atraumatic.  Nose: Nose normal.  Mouth/Throat: No oropharyngeal exudate.  Eyes: EOM are normal. No scleral icterus.  Neck: No JVD present. No thyromegaly present.  Cardiovascular: Regular rhythm.  Bradycardia present.     Borderline bradycardia, rate just under 60  Pulmonary/Chest: Effort normal. She has wheezes (few low-pitched wheezes bilaterally).  Abdominal: She exhibits no distension.  Lymphadenopathy:    She has no cervical adenopathy.  Skin: Skin is warm and dry. No rash noted. She is not diaphoretic. No pallor. Nails show no clubbing.  Psychiatric: Her behavior is normal. Judgment and thought content normal. She exhibits a depressed mood (mildly depressed affect, but good eye contact with examiner). She expresses no homicidal and no suicidal ideation.      Assessment & Plan:   Problem List Items Addressed This Visit      Other  Major depressive disorder, single episode, moderate with anxious distress (HCC)    Increase the escitalopram to 20 mg daily; f/u in 3-4 weeks      Relevant Medications   escitalopram (LEXAPRO) 20 MG tablet   History of lung cancer    Having night sweats; has seen pulmonologist; will check labs; was seeing [facility] oncologist; encouraged her to contact that doctor to see if additional scanning needs to be done      Relevant Orders   CBC with Differential/Platelet   C-reactive protein   Lactate Dehydrogenase (LDH)   Vitamin D deficiency    Continue supplementation; check level today      Relevant Orders   VITAMIN D 25 Hydroxy (Vit-D Deficiency, Fractures)   Tobacco abuse    Encouraged her to quit smoking; I am here to help if/when she is ready to stop smoking      Night sweats    Discussed; will check labs; may be related to the SSRI, bedclothes (dual-heated blanket on the bed); chest CT reviewed; will check labs (CBC, LDH, CRP)      Relevant Orders   CBC with Differential/Platelet   C-reactive protein   Lactate Dehydrogenase (LDH)   Cough    Seeing pulmonologist; had TB test, chest xray and chest CT; smoking cessation urged       Other Visit Diagnoses    Weakness    -  Primary    short-term, but patient feeling "great" now; not sure if anxiety,  depression, related to work, not wanting to go in on Monday; labs, close f/u    Relevant Orders    CBC with Differential/Platelet    UA/M w/rflx Culture, Routine    Comprehensive metabolic panel    Abnormal urine        recheck urine today    Relevant Orders    UA/M w/rflx Culture, Routine       Follow up plan: Return 3-4 weeks, for medicine follow-up.  An after-visit summary was printed and given to the patient at [facility].  Please see the patient instructions which may contain other information and recommendations beyond what is mentioned above in the assessment and plan.  Meds ordered this encounter  Medications  . fluticasone (FLONASE) 50 MCG/ACT nasal spray    Sig: Place into the nose.  . escitalopram (LEXAPRO) 20 MG tablet    Sig: Take 1 tablet (20 mg total) by mouth daily.    Dispense:  30 tablet    Refill:  0    We're increasing dose; cancel the 10 mg refills if any left    Orders Placed This Encounter  Procedures  . CBC with Differential/Platelet  . VITAMIN D 25 Hydroxy (Vit-D Deficiency, Fractures)  . UA/M w/rflx Culture, Routine  . Comprehensive metabolic panel  . C-reactive protein  . Lactate Dehydrogenase (LDH)

## 2015-06-08 NOTE — Assessment & Plan Note (Signed)
Increase the escitalopram to 20 mg daily; f/u in 3-4 weeks

## 2015-06-08 NOTE — Assessment & Plan Note (Addendum)
Encouraged her to quit smoking; I am here to help if/when she is ready to stop smoking

## 2015-06-08 NOTE — Patient Instructions (Addendum)
Think about counseling, which would certainly be helpful Increase the escitalopram (Lexapro) to 20 mg daily Trouble shoot to find stress outlets Have labs done today, and we'll let you know the results Please do call and schedule an appointment with your [facility] doctor about your weakness and night sweats Return in 3-4 weeks

## 2015-06-08 NOTE — Assessment & Plan Note (Addendum)
Discussed; will check labs; may be related to the SSRI, bedclothes (dual-heated blanket on the bed); chest CT reviewed; will check labs (CBC, LDH, CRP)

## 2015-06-08 NOTE — Assessment & Plan Note (Addendum)
Having night sweats; has seen pulmonologist; will check labs; was seeing [facility] oncologist; encouraged her to contact that doctor to see if additional scanning needs to be done

## 2015-06-09 LAB — CBC WITH DIFFERENTIAL/PLATELET
BASOS ABS: 0 10*3/uL (ref 0.0–0.2)
Basos: 0 %
EOS (ABSOLUTE): 0.3 10*3/uL (ref 0.0–0.4)
EOS: 3 %
Hematocrit: 44.1 % (ref 34.0–46.6)
Hemoglobin: 14.8 g/dL (ref 11.1–15.9)
IMMATURE GRANULOCYTES: 0 %
Immature Grans (Abs): 0 10*3/uL (ref 0.0–0.1)
LYMPHS ABS: 2.7 10*3/uL (ref 0.7–3.1)
Lymphs: 29 %
MCH: 27.9 pg (ref 26.6–33.0)
MCHC: 33.6 g/dL (ref 31.5–35.7)
MCV: 83 fL (ref 79–97)
MONOS ABS: 0.7 10*3/uL (ref 0.1–0.9)
Monocytes: 7 %
NEUTROS PCT: 61 %
Neutrophils Absolute: 5.7 10*3/uL (ref 1.4–7.0)
PLATELETS: 315 10*3/uL (ref 150–379)
RBC: 5.31 x10E6/uL — AB (ref 3.77–5.28)
RDW: 14.4 % (ref 12.3–15.4)
WBC: 9.3 10*3/uL (ref 3.4–10.8)

## 2015-06-09 LAB — MICROSCOPIC EXAMINATION: CASTS: NONE SEEN /LPF

## 2015-06-09 LAB — COMPREHENSIVE METABOLIC PANEL
A/G RATIO: 1.8 (ref 1.2–2.2)
ALT: 21 IU/L (ref 0–32)
AST: 17 IU/L (ref 0–40)
Albumin: 4.6 g/dL (ref 3.5–5.5)
Alkaline Phosphatase: 66 IU/L (ref 39–117)
BILIRUBIN TOTAL: 0.5 mg/dL (ref 0.0–1.2)
BUN / CREAT RATIO: 13 (ref 9–23)
BUN: 11 mg/dL (ref 6–24)
CHLORIDE: 99 mmol/L (ref 96–106)
CO2: 27 mmol/L (ref 18–29)
Calcium: 10 mg/dL (ref 8.7–10.2)
Creatinine, Ser: 0.86 mg/dL (ref 0.57–1.00)
GFR calc non Af Amer: 75 mL/min/{1.73_m2} (ref >59)
GFR, EST AFRICAN AMERICAN: 86 mL/min/{1.73_m2} (ref >59)
GLOBULIN, TOTAL: 2.5 g/dL (ref 1.5–4.5)
Glucose: 91 mg/dL (ref 65–99)
POTASSIUM: 4 mmol/L (ref 3.5–5.2)
Sodium: 143 mmol/L (ref 134–144)
TOTAL PROTEIN: 7.1 g/dL (ref 6.0–8.5)

## 2015-06-09 LAB — UA/M W/RFLX CULTURE, ROUTINE
Bilirubin, UA: NEGATIVE
Glucose, UA: NEGATIVE
Ketones, UA: NEGATIVE
LEUKOCYTES UA: NEGATIVE
NITRITE UA: NEGATIVE
PH UA: 6 (ref 5.0–7.5)
Protein, UA: NEGATIVE
RBC UA: NEGATIVE
Specific Gravity, UA: 1.025 (ref 1.005–1.030)
Urobilinogen, Ur: 0.2 mg/dL (ref 0.2–1.0)

## 2015-06-09 LAB — LACTATE DEHYDROGENASE: LDH: 181 IU/L (ref 119–226)

## 2015-06-09 LAB — VITAMIN D 25 HYDROXY (VIT D DEFICIENCY, FRACTURES): Vit D, 25-Hydroxy: 46.2 ng/mL (ref 30.0–100.0)

## 2015-06-09 LAB — C-REACTIVE PROTEIN: CRP: 1.3 mg/L (ref 0.0–4.9)

## 2015-06-14 ENCOUNTER — Telehealth: Payer: Self-pay | Admitting: Family Medicine

## 2015-06-14 ENCOUNTER — Encounter: Payer: Self-pay | Admitting: Family Medicine

## 2015-06-14 DIAGNOSIS — D751 Secondary polycythemia: Secondary | ICD-10-CM | POA: Insufficient documentation

## 2015-06-14 DIAGNOSIS — R82998 Other abnormal findings in urine: Secondary | ICD-10-CM | POA: Insufficient documentation

## 2015-06-14 DIAGNOSIS — R718 Other abnormality of red blood cells: Secondary | ICD-10-CM | POA: Insufficient documentation

## 2015-06-14 NOTE — Telephone Encounter (Signed)
PLEASE CALL PT WITH RESULTS

## 2015-06-15 ENCOUNTER — Other Ambulatory Visit: Payer: Self-pay | Admitting: Family Medicine

## 2015-06-15 DIAGNOSIS — R0683 Snoring: Secondary | ICD-10-CM

## 2015-06-15 DIAGNOSIS — D751 Secondary polycythemia: Secondary | ICD-10-CM

## 2015-07-06 ENCOUNTER — Encounter: Payer: Self-pay | Admitting: Family Medicine

## 2015-07-06 ENCOUNTER — Ambulatory Visit (INDEPENDENT_AMBULATORY_CARE_PROVIDER_SITE_OTHER): Payer: Managed Care, Other (non HMO) | Admitting: Family Medicine

## 2015-07-06 VITALS — BP 138/86 | HR 100 | Temp 99.6°F | Resp 16 | Wt 212.0 lb

## 2015-07-06 DIAGNOSIS — R0683 Snoring: Secondary | ICD-10-CM

## 2015-07-06 DIAGNOSIS — F321 Major depressive disorder, single episode, moderate: Secondary | ICD-10-CM | POA: Diagnosis not present

## 2015-07-06 DIAGNOSIS — J438 Other emphysema: Secondary | ICD-10-CM | POA: Diagnosis not present

## 2015-07-06 MED ORDER — ESCITALOPRAM OXALATE 20 MG PO TABS
20.0000 mg | ORAL_TABLET | Freq: Every day | ORAL | Status: DC
Start: 2015-07-06 — End: 2016-01-30

## 2015-07-06 NOTE — Progress Notes (Signed)
BP 138/86 mmHg  Pulse 100  Temp(Src) 99.6 F (37.6 C) (Oral)  Resp 16  Wt 212 lb (96.163 kg)  SpO2 94%   Subjective:    Patient ID: Debra Mccann, female    DOB: 1955/11/17, 60 y.o.   MRN: [id number]  HPI: Debra Mccann is a 60 y.o. female  Chief Complaint  Patient presents with  . Follow-up   Patient was here on April 13th; at that time, she was depressed and had a PHQ-9 score of 9; we increased her SSRI Few headaches, but sleeping well; mood is much better  Depression screen [facility] 2/9 07/06/2015 07/06/2015 06/08/2015 12/09/2014 11/18/2014  Decreased Interest 0 0 '1 3 3  '$ Down, Depressed, Hopeless 0 0 1 0 3  PHQ - 2 Score 0 0 '2 3 6  '$ Altered sleeping - - '3 2 3  '$ Tired, decreased energy - - '3 1 3  '$ Change in appetite - - '1 3 3  '$ Feeling bad or failure about yourself  - - 0 0 3  Trouble concentrating - - 0 1 1  Moving slowly or fidgety/restless - - 0 0 3  Suicidal thoughts - - 0 0 1  PHQ-9 Score - - '9 10 23  '$ Difficult doing work/chores - - Very difficult Not difficult at all -   She also had an elevated RBC and was referred for consideration of a sleep study; she was referred to pulmonologist and she is going in June; trouble waking up in the morning; trouble getting started, wants to go back to sleep; leaves home at 7:15 am and gets home at 6:00 or 6:30 pm; 35 minute drive  Doing well with inhalers  Allergies; using flonase and zyrtec for allergies; was a bad year this year; lots of congestion  Relevant past medical, surgical, family and social history reviewed Past Medical History  Diagnosis Date  . Asthma   . Osteopenia   . Depression   . COPD (chronic obstructive pulmonary disease) ([place])   . Urticaria   . OA (osteoarthritis) of hip     right  . Bronchitis   . Cancer ([place])     lung cancer  . Lung cancer ([place]) 2004   Past Surgical History  Procedure Laterality Date  . Cholecystectomy  1999  . Abdominal hysterectomy  1997    partial, only ovaries remain;  due to heavy bleeding  . Lung cancer surgery Right 2004    removed "top portion of right lung"  wisdom tooth extracted yesterday; not taking any pain medicine  Social History  Substance Use Topics  . Smoking status: Current Every Day Smoker -- 0.50 packs/day for 23 years  . Smokeless tobacco: Never Used  . Alcohol Use: No   Interim medical history since last visit reviewed. Allergies and medications reviewed  Review of Systems Per HPI unless specifically indicated above     Objective:    BP 138/86 mmHg  Pulse 100  Temp(Src) 99.6 F (37.6 C) (Oral)  Resp 16  Wt 212 lb (96.163 kg)  SpO2 94%  Wt Readings from Last 3 Encounters:  07/06/15 212 lb (96.163 kg)  06/08/15 208 lb (94.348 kg)  01/30/15 209 lb (94.802 kg)    Physical Exam  Constitutional: She appears well-developed and well-nourished.  HENT:  Right Ear: Tympanic membrane and ear canal normal.  Left Ear: Tympanic membrane and ear canal normal.  Nose: No rhinorrhea.  Mouth/Throat: Oropharynx is clear and moist and mucous membranes are normal.  Eyes:  EOM are normal. No scleral icterus.  Cardiovascular: Normal rate and regular rhythm.   Borderline tachycardia, but rate under 100 at time of visit  Pulmonary/Chest: Effort normal and breath sounds normal.  Psychiatric: She has a normal mood and affect. Her behavior is normal.    Results for orders placed or performed in visit on 06/08/15  Microscopic Examination  Result Value Ref Range   WBC, UA 0-5 0 -  5 /hpf   RBC, UA 0-2 0 -  2 /hpf   Epithelial Cells (non renal) 0-10 0 - 10 /hpf   Casts None seen None seen /lpf   Crystals Present (A) N/A   Crystal Type Calcium Oxalate N/A   Mucus, UA Present Not Estab.   Bacteria, UA Few None seen/Few  CBC with Differential/Platelet  Result Value Ref Range   WBC 9.3 3.4 - 10.8 x10E3/uL   RBC 5.31 (H) 3.77 - 5.28 x10E6/uL   Hemoglobin 14.8 11.1 - 15.9 g/dL   Hematocrit 44.1 34.0 - 46.6 %   MCV 83 79 - 97 fL   MCH 27.9  26.6 - 33.0 pg   MCHC 33.6 31.5 - 35.7 g/dL   RDW 14.4 12.3 - 15.4 %   Platelets 315 150 - 379 x10E3/uL   Neutrophils 61 %   Lymphs 29 %   Monocytes 7 %   Eos 3 %   Basos 0 %   Neutrophils Absolute 5.7 1.4 - 7.0 x10E3/uL   Lymphocytes Absolute 2.7 0.7 - 3.1 x10E3/uL   Monocytes Absolute 0.7 0.1 - 0.9 x10E3/uL   EOS (ABSOLUTE) 0.3 0.0 - 0.4 x10E3/uL   Basophils Absolute 0.0 0.0 - 0.2 x10E3/uL   Immature Granulocytes 0 %   Immature Grans (Abs) 0.0 0.0 - 0.1 x10E3/uL  Comprehensive metabolic panel  Result Value Ref Range   Glucose 91 65 - 99 mg/dL   BUN 11 6 - 24 mg/dL   Creatinine, Ser 0.86 0.57 - 1.00 mg/dL   GFR calc non Af Amer 75 >59 mL/min/1.73   GFR calc Af Amer 86 >59 mL/min/1.73   BUN/Creatinine Ratio 13 9 - 23   Sodium 143 134 - 144 mmol/L   Potassium 4.0 3.5 - 5.2 mmol/L   Chloride 99 96 - 106 mmol/L   CO2 27 18 - 29 mmol/L   Calcium 10.0 8.7 - 10.2 mg/dL   Total Protein 7.1 6.0 - 8.5 g/dL   Albumin 4.6 3.5 - 5.5 g/dL   Globulin, Total 2.5 1.5 - 4.5 g/dL   Albumin/Globulin Ratio 1.8 1.2 - 2.2   Bilirubin Total 0.5 0.0 - 1.2 mg/dL   Alkaline Phosphatase 66 39 - 117 IU/L   AST 17 0 - 40 IU/L   ALT 21 0 - 32 IU/L  UA/M w/rflx Culture, Routine  Result Value Ref Range   Specific Gravity, UA 1.025 1.005 - 1.030   pH, UA 6.0 5.0 - 7.5   Color, UA Yellow Yellow   Appearance Ur Cloudy (A) Clear   Leukocytes, UA Negative Negative   Protein, UA Negative Negative/Trace   Glucose, UA Negative Negative   Ketones, UA Negative Negative   RBC, UA Negative Negative   Bilirubin, UA Negative Negative   Urobilinogen, Ur 0.2 0.2 - 1.0 mg/dL   Nitrite, UA Negative Negative   Microscopic Examination Comment    Microscopic Examination See below:    Urinalysis Reflex Comment   VITAMIN D 25 Hydroxy (Vit-D Deficiency, Fractures)  Result Value Ref Range   Vit D, 25-Hydroxy 46.2  30.0 - 100.0 ng/mL  Lactate dehydrogenase  Result Value Ref Range   LDH 181 119 - 226 IU/L    C-reactive protein  Result Value Ref Range   CRP 1.3 0.0 - 4.9 mg/L      Assessment & Plan:   Problem List Items Addressed This Visit      Respiratory   COPD (chronic obstructive pulmonary disease) ([place])    Followed by pulmonologist; doing well with current regimen of inhalers      Relevant Medications   albuterol (PROAIR HFA) 108 (90 Base) MCG/ACT inhaler     Other   Major depressive disorder, single episode, moderate with anxious distress ([place]) - Primary    Doing much better on the 20 mg of escitalopram; refills provided; I am here if needed before next appt; return in 6 months      Relevant Medications   escitalopram (LEXAPRO) 20 MG tablet   Snoring    Patient to see pulmonologist soon for evaluation of elevated RBCs, possible OSA; patient instructed to never drive if sleepy          Follow up plan: Return in about 6 months (around 01/06/2016) for follow-up.  An after-visit summary was printed and given to the patient at [facility].  Please see the patient instructions which may contain other information and recommendations beyond what is mentioned above in the assessment and plan.  Meds ordered this encounter  Medications  . albuterol (PROAIR HFA) 108 (90 Base) MCG/ACT inhaler    Sig: Inhale into the lungs.  Marland Kitchen escitalopram (LEXAPRO) 20 MG tablet    Sig: Take 1 tablet (20 mg total) by mouth daily.    Dispense:  30 tablet    Refill:  11

## 2015-07-06 NOTE — Patient Instructions (Addendum)
Never drive if tired or sleepy Return in 6 months, sooner if needed Please do see the pulmonologist

## 2015-07-06 NOTE — Assessment & Plan Note (Signed)
Patient to see pulmonologist soon for evaluation of elevated RBCs, possible OSA; patient instructed to never drive if sleepy

## 2015-07-06 NOTE — Assessment & Plan Note (Addendum)
Followed by pulmonologist; doing well with current regimen of inhalers

## 2015-07-06 NOTE — Assessment & Plan Note (Signed)
Doing much better on the 20 mg of escitalopram; refills provided; I am here if needed before next appt; return in 6 months

## 2015-11-10 ENCOUNTER — Other Ambulatory Visit: Payer: Self-pay | Admitting: Family Medicine

## 2015-11-10 DIAGNOSIS — Z1231 Encounter for screening mammogram for malignant neoplasm of breast: Secondary | ICD-10-CM

## 2016-01-05 ENCOUNTER — Ambulatory Visit: Payer: Managed Care, Other (non HMO) | Admitting: Family Medicine

## 2016-01-05 ENCOUNTER — Ambulatory Visit
Admission: RE | Admit: 2016-01-05 | Discharge: 2016-01-05 | Disposition: A | Discharge status: Home / Self Care | Payer: Managed Care, Other (non HMO) | Source: Ambulatory Visit | Attending: Family Medicine | Admitting: Family Medicine

## 2016-01-05 DIAGNOSIS — Z1231 Encounter for screening mammogram for malignant neoplasm of breast: Secondary | ICD-10-CM | POA: Diagnosis not present

## 2016-01-05 HISTORY — DX: Personal history of antineoplastic chemotherapy: Z92.21

## 2016-01-16 ENCOUNTER — Ambulatory Visit: Payer: Managed Care, Other (non HMO) | Admitting: Family Medicine

## 2016-01-30 ENCOUNTER — Telehealth: Payer: Self-pay

## 2016-01-30 ENCOUNTER — Ambulatory Visit (INDEPENDENT_AMBULATORY_CARE_PROVIDER_SITE_OTHER): Payer: Managed Care, Other (non HMO) | Admitting: Family Medicine

## 2016-01-30 ENCOUNTER — Encounter: Payer: Self-pay | Admitting: Family Medicine

## 2016-01-30 DIAGNOSIS — R635 Abnormal weight gain: Secondary | ICD-10-CM | POA: Diagnosis not present

## 2016-01-30 DIAGNOSIS — E559 Vitamin D deficiency, unspecified: Secondary | ICD-10-CM | POA: Diagnosis not present

## 2016-01-30 DIAGNOSIS — D751 Secondary polycythemia: Secondary | ICD-10-CM

## 2016-01-30 DIAGNOSIS — J438 Other emphysema: Secondary | ICD-10-CM | POA: Diagnosis not present

## 2016-01-30 DIAGNOSIS — F321 Major depressive disorder, single episode, moderate: Secondary | ICD-10-CM | POA: Diagnosis not present

## 2016-01-30 DIAGNOSIS — R61 Generalized hyperhidrosis: Secondary | ICD-10-CM | POA: Diagnosis not present

## 2016-01-30 DIAGNOSIS — R5383 Other fatigue: Secondary | ICD-10-CM

## 2016-01-30 LAB — CBC WITH DIFFERENTIAL/PLATELET
BASOS ABS: 0 {cells}/uL (ref 0–200)
BASOS PCT: 0 %
EOS ABS: 226 {cells}/uL (ref 15–500)
Eosinophils Relative: 2 %
HEMATOCRIT: 46.1 % — AB (ref 35.0–45.0)
HEMOGLOBIN: 15.6 g/dL — AB (ref 11.7–15.5)
LYMPHS ABS: 2034 {cells}/uL (ref 850–3900)
Lymphocytes Relative: 18 %
MCH: 29 pg (ref 27.0–33.0)
MCHC: 33.8 g/dL (ref 32.0–36.0)
MCV: 85.7 fL (ref 80.0–100.0)
MONO ABS: 791 {cells}/uL (ref 200–950)
MPV: 10.2 fL (ref 7.5–12.5)
Monocytes Relative: 7 %
NEUTROS ABS: 8249 {cells}/uL — AB (ref 1500–7800)
Neutrophils Relative %: 73 %
PLATELETS: 313 10*3/uL (ref 140–400)
RBC: 5.38 MIL/uL — ABNORMAL HIGH (ref 3.80–5.10)
RDW: 14.2 % (ref 11.0–15.0)
WBC: 11.3 10*3/uL — ABNORMAL HIGH (ref 3.8–10.8)

## 2016-01-30 LAB — VITAMIN B12: Vitamin B-12: 805 pg/mL (ref 200–1100)

## 2016-01-30 LAB — TSH: TSH: 1.69 mIU/L

## 2016-01-30 MED ORDER — DULOXETINE HCL 30 MG PO CPEP: ORAL_CAPSULE | ORAL | 0 refills | Status: DC

## 2016-01-30 NOTE — Assessment & Plan Note (Signed)
Check vit D level and supplement as needed; low vit D could exacerbate depression

## 2016-01-30 NOTE — Assessment & Plan Note (Signed)
Recommend psychiatrist; change escitalopram to Cymbalta; resources available, number given; check labs to make sure vit D or hypothyroidism, etc; if any SI/HI, then seek medical help immediately

## 2016-01-30 NOTE — Telephone Encounter (Signed)
Patients husband called first states wife was very depressed and needed to be seen today.  States she has been canceled on several times due to Dr. Sanda Klein not being here.  I informed him we had no appts today and she could come next week or go to ER or [facility] number given.  Few mins later daughter called back demanding patient to be seen today and that she could not wait.  Miel talked with daughter and opened schedule for today at 4 for her since we have had to reschedule patient twice now.

## 2016-01-30 NOTE — Assessment & Plan Note (Signed)
May be menopausal (likely); check LDH

## 2016-01-30 NOTE — Patient Instructions (Addendum)
Try plain Mucinex or Mucinex DM Avoid Mucinex-D  For the next week, take just half of a pill of the escitalopram (10 mg) and start the new medicine, duloxetine 30 mg daily After one week, stop the escitalopram and take two pills (60 mg) of the duloxetine every day  [place] has a walk-in crisis clinic on Mondays, Wednesdays, and Friday until 3 pm [address], [place], [place] [address] [phone]  Try meditation and relaxation  Please call one of the psychologists to start working with them We'll have you see a psychiatrist   Major Depressive Disorder, Adult Major depressive disorder (MDD) is a mental health condition. MDD often makes you feel sad, hopeless, or helpless. MDD can also cause symptoms in your body. MDD can affect your:  Work.  School.  Relationships.  Other normal activities. MDD can range from mild to very bad. It may occur once (single episode MDD). It can also occur many times (recurrent MDD). The main symptoms of MDD often include:  Feeling sad, depressed, or irritable most of the time.  Loss of interest. MDD symptoms also include:  Sleeping too much or too little.  Eating too much or too little.  A change in your weight.  Feeling tired (fatigue) or having low energy.  Feeling worthless.  Feeling guilty.  Trouble making decisions.  Trouble thinking clearly.  Thoughts of suicide or harming others.  Feeling weak.  Feeling agitated.  Keeping yourself from being around other people (isolation). Follow these instructions at home: Activity  Do these things as told by your doctor:  Go back to your normal activities.  Exercise regularly.  Spend time outdoors. Alcohol  Talk with your doctor about how alcohol can affect your antidepressant medicines.  Do not drink alcohol. Or, limit how much alcohol you drink.  This means no more than 1 drink a day for nonpregnant women and 2 drinks a day for men. One drink equals  one of these:  12 oz of beer.  5 oz of wine.  1 oz of hard liquor. General instructions  Take over-the-counter and prescription medicines only as told by your doctor.  Eat a healthy diet.  Get plenty of sleep.  Find activities that you enjoy. Make time to do them.  Think about joining a support group. Your doctor may be able to suggest a group for you.  Keep all follow-up visits as told by your doctor. This is important. Where to find more information:  [employer] on Mental Illness:  www.nami.org  U.S. National Institute of Mental Health:  https://carter.com/  National Suicide Prevention Lifeline:  726-094-8808. This is free, 24-hour help. Contact a doctor if:  Your symptoms get worse.  You have new symptoms. Get help right away if:  You self-harm.  You see, hear, taste, smell, or feel things that are not present (hallucinate). If you ever feel like you may hurt yourself or others, or have thoughts about taking your own life, get help right away. You can go to your nearest emergency department or call:  Your local emergency services (911 in the [place]  A suicide crisis helpline, such as the [facility]:  870-800-6109. This is open 24 hours a day. This information is not intended to replace advice given to you by your health care provider. Make sure you discuss any questions you have with your health care provider. Document Released: 01/23/2015 Document Revised: 10/29/2015 Document Reviewed: 10/29/2015 Elsevier Interactive Patient Education  2017 [employer].

## 2016-01-30 NOTE — Telephone Encounter (Signed)
Yes, once I heard what was going on, I told staff to work her in today; she has been seen

## 2016-01-30 NOTE — Assessment & Plan Note (Addendum)
Seeing Dr. Raul Del, recheck CBC

## 2016-01-30 NOTE — Assessment & Plan Note (Signed)
Check labs 

## 2016-01-30 NOTE — Assessment & Plan Note (Signed)
Check TSH 

## 2016-01-30 NOTE — Telephone Encounter (Signed)
Patient's daughter called very upset stating that her mother is not doing so good and demanded that she be seen today or she will pursue a lawsuit. Debra Mccann had already spoken with this patient's husband regarding increasing her medication but told him that Dr. Sanda Klein was seeing patients so she might not get an answer right away. He was upset that his wife has not been seen since May due to the provider being out of the office and her appts was cancelled. He was given information on the [facility] and was told that he could take her to the ER if he felt that she is a harm to herself or someone else. She stated that her mother was at home by herself but she did not think that she was bad enough to be involuntarily committed.   I then informed her that I would let Dr. Sanda Klein and our office manager know of this conversation and that someone will give her a call back today.  Daughter's number has been confirmed.

## 2016-01-30 NOTE — Assessment & Plan Note (Signed)
Managed by Dr. Raul Del

## 2016-01-30 NOTE — Progress Notes (Signed)
BP 126/78   Pulse (!) 111   Temp 98.7 F (37.1 C) (Oral)   Resp 16   Wt 215 lb (97.5 kg)   SpO2 95%   BMI 35.78 kg/m    Subjective:    Patient ID: Debra Mccann, female    DOB: 07-26-1955, 60 y.o.   MRN: [id number]  HPI: Debra Mccann is a 60 y.o. female  Chief Complaint  Patient presents with  . Depression   Patient is here with husband for depression They tried to check her labs at work; few weeks ago; they thought she was dehydrated (I do not have those results to review) No thyroid trouble she says on the recent labs Gradually getting more depressed over the last 1-2 months Husband apologized for daughter's call; explained she was just worried Never been hospitalized for her mood Never seen psychiatrist or psychologist First struggle with depression was in [place] in 2001 No real lows since 2001 until this one Work is stressful; Engineer, drilling; very demanding job; expect a lot of her and she carries that stress home Gaining weight; junk food, not feeling like cooking; low activity level  Vitamin D deficiency; taking multiple vitamin for women over 50  Still has ovaries; night sweats; check [place]  She has a cold, coughing, sneezing, nose running; no facial pain, no sinus pressure; coughing up clear phlegm; tried OTC theraflu  Depression screen [facility] 2/9 01/30/2016 07/06/2015 07/06/2015 06/08/2015 12/09/2014  Decreased Interest 3 0 0 1 3  Down, Depressed, Hopeless 3 0 0 1 0  PHQ - 2 Score 6 0 0 2 3  Altered sleeping 2 - - 3 2  Tired, decreased energy 3 - - 3 1  Change in appetite 2 - - 1 3  Feeling bad or failure about yourself  3 - - 0 0  Trouble concentrating 0 - - 0 1  Moving slowly or fidgety/restless 3 - - 0 0  Suicidal thoughts 0 - - 0 0  PHQ-9 Score 19 - - 9 10  Difficult doing work/chores Extremely dIfficult - - Very difficult Not difficult at all  MD notes: no SI/HI  Relevant past medical, surgical, family and social history  reviewed Past Medical History:  Diagnosis Date  . Asthma   . Bronchitis   . Cancer ([place])    lung cancer  . COPD (chronic obstructive pulmonary disease) ([place])   . Depression   . Lung cancer ([place]) 2004  . OA (osteoarthritis) of hip    right  . Osteopenia   . Personal history of chemotherapy   . Urticaria    Past Surgical History:  Procedure Laterality Date  . ABDOMINAL HYSTERECTOMY  1997   partial, only ovaries remain; due to heavy bleeding  . CHOLECYSTECTOMY  1999  . LUNG CANCER SURGERY Right 2004   removed "top portion of right lung"   Family History  Problem Relation Age of Onset  . CAD Mother     with stents  . Heart disease Mother   . Hypertension Mother   . Heart disease Father     3 vessel CABG  . Cancer Father     melanoma  . Hypertension Father   . Diabetes Neg Hx   . Stroke Neg Hx   . COPD Neg Hx   . Breast cancer Neg Hx   MD note: no known family hx of depression; then husband says that patient's brother committed suicide; patient says her brother was 16  when they found him dead from an apparent self-inflicted GSW; she was not aware that he had been struggling with depression prior to that  Social History  Substance Use Topics  . Smoking status: Current Every Day Smoker    Packs/day: 0.50    Years: 23.00  . Smokeless tobacco: Never Used  . Alcohol use No   Interim medical history since last visit reviewed. Allergies and medications reviewed  Review of Systems Per HPI unless specifically indicated above     Objective:    BP 126/78   Pulse (!) 111   Temp 98.7 F (37.1 C) (Oral)   Resp 16   Wt 215 lb (97.5 kg)   SpO2 95%   BMI 35.78 kg/m   Wt Readings from Last 3 Encounters:  01/30/16 215 lb (97.5 kg)  07/06/15 212 lb (96.2 kg)  06/08/15 208 lb (94.3 kg)    Physical Exam  Constitutional: She appears well-developed and well-nourished.  HENT:  Right Ear: Tympanic membrane and ear canal normal.  Left Ear: Tympanic membrane and ear canal  normal.  Nose: No rhinorrhea.  Mouth/Throat: Oropharynx is clear and moist and mucous membranes are normal.  Eyes: EOM are normal. No scleral icterus.  Cardiovascular: Regular rhythm.  Tachycardia present.   Borderline tachycardia, but rate under 100 at time of visit  Pulmonary/Chest: Effort normal. No respiratory distress. She has wheezes.  Psychiatric: She has a normal mood and affect. Her behavior is normal.      Assessment & Plan:   Problem List Items Addressed This Visit      Respiratory   COPD (chronic obstructive pulmonary disease) ([place])    Managed by Dr. Raul Del        Other   Weight gain    Check TSH      Relevant Orders   TSH (Completed)   Vitamin D deficiency    Check vit D level and supplement as needed; low vit D could exacerbate depression      Relevant Orders   VITAMIN D 25 Hydroxy (Vit-D Deficiency, Fractures) (Completed)   Night sweats    May be menopausal (likely); check LDH      Relevant Orders   COMPLETE METABOLIC PANEL WITH GFR (Completed)   Lactate Dehydrogenase (LDH) (Completed)   Follicle stimulating hormone (Completed)   Luteinizing hormone (Completed)   Major depressive disorder, single episode, moderate with anxious distress ([place])    Recommend psychiatrist; change escitalopram to Cymbalta; resources available, number given; check labs to make sure vit D or hypothyroidism, etc; if any SI/HI, then seek medical help immediately      Relevant Medications   escitalopram (LEXAPRO) 20 MG tablet   DULoxetine (CYMBALTA) 30 MG capsule   Other Relevant Orders   Ambulatory referral to Psychiatry   Fatigue    Check labs      Relevant Orders   Vitamin B12 (Completed)   Erythrocytosis    Seeing Dr. Raul Del, recheck CBC      Relevant Orders   CBC with Differential/Platelet (Completed)      Follow up plan: Return in about 2 weeks (around 02/13/2016).  An after-visit summary was printed and given to the patient at [place].  Please see the  patient instructions which may contain other information and recommendations beyond what is mentioned above in the assessment and plan.  Meds ordered this encounter  Medications  . escitalopram (LEXAPRO) 20 MG tablet    Sig: One-half of a pill by mouth daily for 7 days, then  stop    Refill:  11  . DULoxetine (CYMBALTA) 30 MG capsule    Sig: One by mouth daily for 7 days, then two a day    Dispense:  53 capsule    Refill:  0    Orders Placed This Encounter  Procedures  . VITAMIN D 25 Hydroxy (Vit-D Deficiency, Fractures)  . TSH  . CBC with Differential/Platelet  . COMPLETE METABOLIC PANEL WITH GFR  . Vitamin B12  . Lactate Dehydrogenase (LDH)  . Follicle stimulating hormone  . Luteinizing hormone  . Ambulatory referral to Psychiatry

## 2016-01-30 NOTE — Telephone Encounter (Signed)
Bring her in today

## 2016-01-31 ENCOUNTER — Encounter: Payer: Self-pay | Admitting: Family Medicine

## 2016-01-31 LAB — COMPLETE METABOLIC PANEL WITH GFR
ALBUMIN: 4.3 g/dL (ref 3.6–5.1)
ALK PHOS: 58 U/L (ref 33–130)
ALT: 13 U/L (ref 6–29)
AST: 16 U/L (ref 10–35)
BILIRUBIN TOTAL: 0.7 mg/dL (ref 0.2–1.2)
BUN: 8 mg/dL (ref 7–25)
CALCIUM: 9.4 mg/dL (ref 8.6–10.4)
CO2: 19 mmol/L — AB (ref 20–31)
CREATININE: 0.83 mg/dL (ref 0.50–1.05)
Chloride: 105 mmol/L (ref 98–110)
GFR, Est Non African American: 78 mL/min (ref >=60)
Glucose, Bld: 88 mg/dL (ref 65–99)
Potassium: 4.2 mmol/L (ref 3.5–5.3)
Sodium: 141 mmol/L (ref 135–146)
TOTAL PROTEIN: 7 g/dL (ref 6.1–8.1)

## 2016-01-31 LAB — LUTEINIZING HORMONE: LH: 32.4 m[IU]/mL

## 2016-01-31 LAB — VITAMIN D 25 HYDROXY (VIT D DEFICIENCY, FRACTURES): Vit D, 25-Hydroxy: 36 ng/mL (ref 30–100)

## 2016-01-31 LAB — LACTATE DEHYDROGENASE: LDH: 173 U/L (ref 120–250)

## 2016-01-31 LAB — FOLLICLE STIMULATING HORMONE: FSH: 50.2 m[IU]/mL

## 2016-02-02 ENCOUNTER — Encounter: Payer: Self-pay | Admitting: Family Medicine

## 2016-02-02 ENCOUNTER — Ambulatory Visit: Payer: Managed Care, Other (non HMO) | Admitting: Family Medicine

## 2016-02-08 ENCOUNTER — Encounter: Payer: Self-pay | Admitting: Family Medicine

## 2016-02-22 ENCOUNTER — Encounter: Payer: Self-pay | Admitting: Family Medicine

## 2016-02-22 ENCOUNTER — Ambulatory Visit (INDEPENDENT_AMBULATORY_CARE_PROVIDER_SITE_OTHER): Payer: Managed Care, Other (non HMO) | Admitting: Family Medicine

## 2016-02-22 VITALS — BP 122/84 | HR 99 | Temp 98.7°F | Resp 14 | Wt 217.0 lb

## 2016-02-22 DIAGNOSIS — Z23 Encounter for immunization: Secondary | ICD-10-CM

## 2016-02-22 DIAGNOSIS — F321 Major depressive disorder, single episode, moderate: Secondary | ICD-10-CM | POA: Diagnosis not present

## 2016-02-22 DIAGNOSIS — R1310 Dysphagia, unspecified: Secondary | ICD-10-CM | POA: Insufficient documentation

## 2016-02-22 MED ORDER — DULOXETINE HCL 60 MG PO CPEP: 60.0000 mg | ORAL_CAPSULE | Freq: Every day | ORAL | 5 refills | Status: DC

## 2016-02-22 NOTE — Progress Notes (Signed)
BP 122/84   Pulse 99   Temp 98.7 F (37.1 C) (Oral)   Resp 14   Wt 217 lb (98.4 kg)   SpO2 96%   BMI 36.11 kg/m    Subjective:    Patient ID: Debra Mccann, female    DOB: March 12, 1955, 60 y.o.   MRN: [id number]  HPI: Debra Mccann is a 60 y.o. female  Chief Complaint  Patient presents with  . Follow-up    3 week  . Depression   Patient is here for f/u after being seen on 01/30/16 for depression She says she is "doing some better" No manic or hypomanic symptoms after starting the [place] pretty well, "really well" actually she says Woke up with some anxiety during the holidays She is not yet working with a Social worker; she gets 3 visits for free at work with employee assistance program, and then insurance kicks in there after the 3rd visit, so she'll start counseling at work  She gets easy bruising on the sun exposed  She has food getting stuck; hurts so bad until it passes; about 1/3 down esophagus; no vomiting; no loss of appetite; no blood in the stool  Depression screen [facility] 2/9 02/22/2016 01/30/2016 07/06/2015 07/06/2015 06/08/2015  Decreased Interest 2 3 0 0 1  Down, Depressed, Hopeless 1 3 0 0 1  PHQ - 2 Score 3 6 0 0 2  Altered sleeping 0 2 - - 3  Tired, decreased energy 1 3 - - 3  Change in appetite 0 2 - - 1  Feeling bad or failure about yourself  1 3 - - 0  Trouble concentrating 1 0 - - 0  Moving slowly or fidgety/restless 0 3 - - 0  Suicidal thoughts 0 0 - - 0  PHQ-9 Score 6 19 - - 9  Difficult doing work/chores Somewhat difficult Extremely dIfficult - - Very difficult    Relevant past medical, surgical, family and social history reviewed Past Medical History:  Diagnosis Date  . Asthma   . Bronchitis   . Cancer ([place])    lung cancer  . COPD (chronic obstructive pulmonary disease) ([place])   . Depression   . Lung cancer ([place]) 2004  . OA (osteoarthritis) of hip    right  . Osteopenia   . Personal history of chemotherapy   . Urticaria     Past Surgical History:  Procedure Laterality Date  . ABDOMINAL HYSTERECTOMY  1997   partial, only ovaries remain; due to heavy bleeding  . CHOLECYSTECTOMY  1999  . LUNG CANCER SURGERY Right 2004   removed "top portion of right lung"   Family History  Problem Relation Age of Onset  . CAD Mother     with stents  . Heart disease Mother   . Hypertension Mother   . Heart disease Father     3 vessel CABG  . Cancer Father     melanoma  . Hypertension Father   . Diabetes Neg Hx   . Stroke Neg Hx   . COPD Neg Hx   . Breast cancer Neg Hx    Social History  Substance Use Topics  . Smoking status: Current Every Day Smoker    Packs/day: 0.50    Years: 23.00  . Smokeless tobacco: Never Used  . Alcohol use No   Interim medical history since last visit reviewed. Allergies and medications reviewed  Review of Systems Per HPI unless specifically indicated above     Objective:  BP 122/84   Pulse 99   Temp 98.7 F (37.1 C) (Oral)   Resp 14   Wt 217 lb (98.4 kg)   SpO2 96%   BMI 36.11 kg/m   Wt Readings from Last 3 Encounters:  02/22/16 217 lb (98.4 kg)  01/30/16 215 lb (97.5 kg)  07/06/15 212 lb (96.2 kg)    Physical Exam  Constitutional: She appears well-developed and well-nourished.  HENT:  Right Ear: Tympanic membrane and ear canal normal.  Left Ear: Tympanic membrane and ear canal normal.  Nose: No rhinorrhea.  Mouth/Throat: Oropharynx is clear and moist and mucous membranes are normal.  Eyes: EOM are normal. No scleral icterus.  Cardiovascular: Normal rate and regular rhythm.   rate under 100 at time of auscultation  Pulmonary/Chest: Effort normal. No respiratory distress. She has wheezes.  Psychiatric: She has a normal mood and affect. Her behavior is normal. Her mood appears not anxious. Her affect is not blunt and not inappropriate. Her speech is not delayed. She is not agitated, not hyperactive, not slowed and not withdrawn. Cognition and memory are not  impaired. She does not exhibit a depressed mood. She expresses no homicidal and no suicidal ideation.  Good eye contact with examiner; appears much improved relative to last visit; not despondent   Results for orders placed or performed in visit on 01/30/16  VITAMIN D 25 Hydroxy (Vit-D Deficiency, Fractures)  Result Value Ref Range   Vit D, 25-Hydroxy 36 30 - 100 ng/mL  TSH  Result Value Ref Range   TSH 1.69 mIU/L  CBC with Differential/Platelet  Result Value Ref Range   WBC 11.3 (H) 3.8 - 10.8 K/uL   RBC 5.38 (H) 3.80 - 5.10 MIL/uL   Hemoglobin 15.6 (H) 11.7 - 15.5 g/dL   HCT 46.1 (H) 35.0 - 45.0 %   MCV 85.7 80.0 - 100.0 fL   MCH 29.0 27.0 - 33.0 pg   MCHC 33.8 32.0 - 36.0 g/dL   RDW 14.2 11.0 - 15.0 %   Platelets 313 140 - 400 K/uL   MPV 10.2 7.5 - 12.5 fL   Neutro Abs 8,249 (H) 1,500 - 7,800 cells/uL   Lymphs Abs 2,034 850 - 3,900 cells/uL   Monocytes Absolute 791 200 - 950 cells/uL   Eosinophils Absolute 226 15 - 500 cells/uL   Basophils Absolute 0 0 - 200 cells/uL   Neutrophils Relative % 73 %   Lymphocytes Relative 18 %   Monocytes Relative 7 %   Eosinophils Relative 2 %   Basophils Relative 0 %   Smear Review Criteria for review not met   COMPLETE METABOLIC PANEL WITH GFR  Result Value Ref Range   Sodium 141 135 - 146 mmol/L   Potassium 4.2 3.5 - 5.3 mmol/L   Chloride 105 98 - 110 mmol/L   CO2 19 (L) 20 - 31 mmol/L   Glucose, Bld 88 65 - 99 mg/dL   BUN 8 7 - 25 mg/dL   Creat 0.83 0.50 - 1.05 mg/dL   Total Bilirubin 0.7 0.2 - 1.2 mg/dL   Alkaline Phosphatase 58 33 - 130 U/L   AST 16 10 - 35 U/L   ALT 13 6 - 29 U/L   Total Protein 7.0 6.1 - 8.1 g/dL   Albumin 4.3 3.6 - 5.1 g/dL   Calcium 9.4 8.6 - 10.4 mg/dL   GFR, Est African American >89 >=60 mL/min   GFR, Est Non African American 78 >=60 mL/min  Vitamin B12  Result  Value Ref Range   Vitamin B-12 805 200 - 1,100 pg/mL  Lactate Dehydrogenase (LDH)  Result Value Ref Range   LDH 173 120 - 774 U/L    Follicle stimulating hormone  Result Value Ref Range   FSH 50.2 mIU/mL  Luteinizing hormone  Result Value Ref Range   LH 32.4 mIU/mL      Assessment & Plan:   Problem List Items Addressed This Visit      Digestive   Food sticks on swallowing    Refer to GI      Relevant Orders   Ambulatory referral to Gastroenterology     Other   Odynophagia - Primary    Food sticking; refer to GI for consideration of EGD; avoid hard bread, steak, etc.      Relevant Orders   Ambulatory referral to Gastroenterology   Major depressive disorder, single episode, moderate with anxious distress (HCC) (Chronic)    Improvement clinically and on PHQ-9 scores; continue treatment; encouraged her to start counseling; I am here for her if needed, and I'll see her back for f/u; see AVS; see help if mood worsens or she has thoughts of self-harm or harming others; she agrees wth plan      Relevant Medications   DULoxetine (CYMBALTA) 60 MG capsule    Other Visit Diagnoses    Needs flu shot       Relevant Orders   Flu Vaccine QUAD 36+ mos PF IM (Fluarix & Fluzone Quad PF) (Completed)      Follow up plan: Return in about 3 months (around 05/22/2016) for follow-up.  An after-visit summary was printed and given to the patient at [place].  Please see the patient instructions which may contain other information and recommendations beyond what is mentioned above in the assessment and plan.  Meds ordered this encounter  Medications  . Cholecalciferol (VITAMIN D-1000 MAX ST) 1000 units tablet    Sig: Take by mouth.  . DULoxetine (CYMBALTA) 60 MG capsule    Sig: Take 1 capsule (60 mg total) by mouth daily.    Dispense:  30 capsule    Refill:  5    Orders Placed This Encounter  Procedures  . Flu Vaccine QUAD 36+ mos PF IM (Fluarix & Fluzone Quad PF)  . Ambulatory referral to Gastroenterology

## 2016-02-22 NOTE — Assessment & Plan Note (Signed)
Refer to GI 

## 2016-02-22 NOTE — Assessment & Plan Note (Signed)
Food sticking; refer to GI for consideration of EGD; avoid hard bread, steak, etc.

## 2016-02-22 NOTE — Patient Instructions (Signed)
We'll refer you to the GI doctor to check out your esophagus Continue the cymbalta Do consider working with the counselor Call me if needed

## 2016-02-23 ENCOUNTER — Encounter: Payer: Self-pay | Admitting: Family Medicine

## 2016-02-29 ENCOUNTER — Encounter: Payer: Self-pay | Admitting: Family Medicine

## 2016-03-01 ENCOUNTER — Encounter: Payer: Self-pay | Admitting: Family Medicine

## 2016-03-02 NOTE — Assessment & Plan Note (Signed)
Improvement clinically and on PHQ-9 scores; continue treatment; encouraged her to start counseling; I am here for her if needed, and I'll see her back for f/u; see AVS; see help if mood worsens or she has thoughts of self-harm or harming others; she agrees wth plan

## 2016-03-12 ENCOUNTER — Ambulatory Visit (INDEPENDENT_AMBULATORY_CARE_PROVIDER_SITE_OTHER): Payer: Managed Care, Other (non HMO) | Admitting: Gastroenterology

## 2016-03-12 ENCOUNTER — Encounter: Payer: Self-pay | Admitting: Gastroenterology

## 2016-03-12 VITALS — BP 130/84 | HR 92 | Temp 98.6°F | Wt 218.0 lb

## 2016-03-12 DIAGNOSIS — R1310 Dysphagia, unspecified: Secondary | ICD-10-CM

## 2016-03-12 DIAGNOSIS — K219 Gastro-esophageal reflux disease without esophagitis: Secondary | ICD-10-CM

## 2016-03-12 DIAGNOSIS — R1319 Other dysphagia: Secondary | ICD-10-CM

## 2016-03-12 MED ORDER — OMEPRAZOLE 40 MG PO CPDR: 40.0000 mg | DELAYED_RELEASE_CAPSULE | Freq: Every day | ORAL | 1 refills | Status: DC

## 2016-03-12 NOTE — Progress Notes (Signed)
Gastroenterology Consultation  Referring Provider:     Arnetha Courser, MD Primary Care Physician:  Enid Derry, MD Primary Gastroenterologist:  Dr. Jonathon Bellows  Reason for Consultation:   Food sticking in throat         HPI:   Debra Mccann is a 61 y.o. y/o female referred for consultation & management  by Dr. Enid Derry, MD.    She has been referred for Issues with swallowing    Dysphagia: Onset and any progression: within the last year, got a bit better- not had an issue in a while  Frequency: once or twice a months  Foods affected : solids , points to the center of her chest  Prior episodes of impaction: no  History of asthma/allergy : none  History of heartburn/Reflux :?copd  Weight loss : no   Prior EGD: posisbly cant recall  PPI/H2 blocker use : unsure    Reflux:  Onset : thinks she has had it for 5 years  Symptoms: heartburn ,  Recent weight gain: a bit  Medications: ?PPI  Narcotics or anticholinergics use : no  PPI /H2 blockers or Antacid  use and timing :?posisbly she is unsure  Dinner time : around 7 pm , goes to bed around 11 pm . In between sits ip   Family history of esophageal cancer:no   She is a smoker Past Medical History:  Diagnosis Date  . Asthma   . Bronchitis   . Cancer ([place])    lung cancer  . COPD (chronic obstructive pulmonary disease) ([place])   . Depression   . Lung cancer ([place]) 2004  . OA (osteoarthritis) of hip    right  . Osteopenia   . Personal history of chemotherapy   . Urticaria     Past Surgical History:  Procedure Laterality Date  . ABDOMINAL HYSTERECTOMY  1997   partial, only ovaries remain; due to heavy bleeding  . CHOLECYSTECTOMY  1999  . LUNG CANCER SURGERY Right 2004   removed "top portion of right lung"    Prior to Admission medications   Medication Sig Start Date End Date Taking? Authorizing Provider  albuterol (PROAIR HFA) 108 (90 Base) MCG/ACT inhaler Inhale into the lungs. 06/17/15   Historical  Provider, MD  cetirizine (ZYRTEC) 10 MG tablet Take 10 mg by mouth daily.    Historical Provider, MD  Cholecalciferol (VITAMIN D-1000 MAX ST) 1000 units tablet Take by mouth.    Historical Provider, MD  docusate sodium (COLACE) 100 MG capsule Take 100 mg by mouth daily.    Historical Provider, MD  DULoxetine (CYMBALTA) 60 MG capsule Take 1 capsule (60 mg total) by mouth daily. 02/22/16   Arnetha Courser, MD  fluticasone (FLONASE) 50 MCG/ACT nasal spray Place into the nose. 02/08/15 02/08/16  Historical Provider, MD  Fluticasone-Salmeterol (ADVAIR DISKUS) 500-50 MCG/DOSE AEPB Inhale 1 puff into the lungs 2 (two) times daily. 11/23/14   Arnetha Courser, MD    Family History  Problem Relation Age of Onset  . CAD Mother     with stents  . Heart disease Mother   . Hypertension Mother   . Heart disease Father     3 vessel CABG  . Cancer Father     melanoma  . Hypertension Father   . Diabetes Neg Hx   . Stroke Neg Hx   . COPD Neg Hx   . Breast cancer Neg Hx      Social History  Substance Use  Topics  . Smoking status: Current Every Day Smoker    Packs/day: 0.50    Years: 23.00  . Smokeless tobacco: Never Used  . Alcohol use No    Allergies as of 03/12/2016 - Review Complete 03/12/2016  Allergen Reaction Noted  . Albuterol Diarrhea, Nausea And Vomiting, and Other (See Comments) 11/16/2014  . Penicillins  11/16/2014    Review of Systems:    All systems reviewed and negative except where noted in HPI.   Physical Exam:  There were no vitals taken for this visit. No LMP recorded. Patient has had a hysterectomy. Psych:  Alert and cooperative. Normal mood and affect. General:   Alert,  Well-developed, well-nourished, pleasant and cooperative in NAD Head:  Normocephalic and atraumatic. Eyes:  Sclera clear, no icterus.   Conjunctiva pink. Ears:  Normal auditory acuity. Nose:  No deformity, discharge, or lesions. Mouth:  No deformity or lesions,oropharynx pink & moist. Neck:  Supple;  no masses or thyromegaly. Lungs:  Respirations even and unlabored.  Clear throughout to auscultation.   No wheezes, crackles, or rhonchi. No acute distress. Heart:  Regular rate and rhythm; no murmurs, clicks, rubs, or gallops. Abdomen:  Normal bowel sounds.  No bruits.  Soft, non-tender and non-distended without masses, hepatosplenomegaly or hernias noted.  No guarding or rebound tenderness.    Msk:  Symmetrical without gross deformities. Good, equal movement & strength bilaterally. Neurologic:  Alert and oriented x3;  grossly normal neurologically. Skin:  Intact without significant lesions or rashes. No jaundice. Psych:  Alert and cooperative. Normal mood and affect.  Imaging Studies: No results found.  Assessment and Plan:   Debra Mccann is a 61 y.o. y/o female has been referred for  issues with swallowing  She also has a history of heartburn suggestive of reflux . I counselled her on life style changes   Plan   1. Omeprazole 40 mg once daily to empirically treat any underlying reflux causing dysphagia with esophageal dysmotility  2. EGD to r/o EOE and dilate any strictures  I have discussed alternative options, risks & benefits,  which include, but are not limited to, bleeding, infection, perforation,respiratory complication & drug reaction.  The patient agrees with this plan & written consent will be obtained.  Follow up in 10 weeks  Dr Jonathon Bellows MD

## 2016-03-12 NOTE — Patient Instructions (Addendum)
EGD to be scheduled 3-4 weeks. Follow-up in 10 weeks

## 2016-03-15 ENCOUNTER — Ambulatory Visit (INDEPENDENT_AMBULATORY_CARE_PROVIDER_SITE_OTHER): Payer: Managed Care, Other (non HMO) | Admitting: Family Medicine

## 2016-03-15 ENCOUNTER — Ambulatory Visit
Admission: RE | Admit: 2016-03-15 | Discharge: 2016-03-15 | Disposition: A | Discharge status: Home / Self Care | Payer: Managed Care, Other (non HMO) | Source: Ambulatory Visit | Attending: Family Medicine | Admitting: Family Medicine

## 2016-03-15 ENCOUNTER — Encounter: Payer: Self-pay | Admitting: Family Medicine

## 2016-03-15 VITALS — BP 126/82 | HR 98 | Temp 98.2°F | Resp 18 | Wt 212.0 lb

## 2016-03-15 DIAGNOSIS — R6889 Other general symptoms and signs: Secondary | ICD-10-CM | POA: Insufficient documentation

## 2016-03-15 DIAGNOSIS — Z9889 Other specified postprocedural states: Secondary | ICD-10-CM | POA: Diagnosis not present

## 2016-03-15 LAB — POCT INFLUENZA A/B
INFLUENZA B, POC: NEGATIVE
Influenza A, POC: NEGATIVE

## 2016-03-15 MED ORDER — OSELTAMIVIR PHOSPHATE 75 MG PO CAPS: 75.0000 mg | ORAL_CAPSULE | Freq: Two times a day (BID) | ORAL | 0 refills | Status: AC

## 2016-03-15 MED ORDER — LEVOFLOXACIN 500 MG PO TABS: 500.0000 mg | ORAL_TABLET | Freq: Every day | ORAL | 0 refills | Status: DC

## 2016-03-15 NOTE — Progress Notes (Signed)
BP 126/82   Pulse 98   Temp 98.2 F (36.8 C) (Oral)   Resp 18   Wt 212 lb (96.2 kg)   SpO2 93%   BMI 35.28 kg/m    Subjective:    Patient ID:  Debra Mccann, female    DOB: [birth year], 61 y.o.   MRN: [id number]  HPI: Debra Mccann is a 61 y.o. female  Chief Complaint  Patient presents with  . URI    onset 2 days symptoms include: fever, cough, sob, wheezing, runny nose, aches and congestion   She got two days ago Just started feeling bad, feels like she just got run over by a truck, aching all over Runny nose, fever, cough, shortness of breath Not taking any OTCs She is here with her husband  Depression screen [facility] 2/9 03/15/2016 02/22/2016 01/30/2016 07/06/2015 07/06/2015  Decreased Interest 0 2 3 0 0  Down, Depressed, Hopeless 0 1 3 0 0  PHQ - 2 Score 0 3 6 0 0  Altered sleeping - 0 2 - -  Tired, decreased energy - 1 3 - -  Change in appetite - 0 2 - -  Feeling bad or failure about yourself  - 1 3 - -  Trouble concentrating - 1 0 - -  Moving slowly or fidgety/restless - 0 3 - -  Suicidal thoughts - 0 0 - -  PHQ-9 Score - 6 19 - -  Difficult doing work/chores - Somewhat difficult Extremely dIfficult - -   Relevant past medical, surgical, family and social history reviewed Past Medical History:  Diagnosis Date  . Asthma   . Bronchitis   . Cancer ([place])    lung cancer  . COPD (chronic obstructive pulmonary disease) ([place])   . Depression   . Lung cancer ([place]) 2004  . OA (osteoarthritis) of hip    right  . Osteopenia   . Personal history of chemotherapy   . Urticaria    Past Surgical History:  Procedure Laterality Date  . ABDOMINAL HYSTERECTOMY  1997   partial, only ovaries remain; due to heavy bleeding  . CHOLECYSTECTOMY  1999  . LUNG CANCER SURGERY Right 2004   removed "top portion of right lung"   Social History  Substance Use Topics  . Smoking status: Current Every Day Smoker    Packs/day: 0.50    Years: 23.00  . Smokeless tobacco: Never  Used  . Alcohol use No   Interim medical history since last visit reviewed. Allergies and medications reviewed  Review of Systems Per HPI unless specifically indicated above     Objective:    BP 126/82   Pulse 98   Temp 98.2 F (36.8 C) (Oral)   Resp 18   Wt 212 lb (96.2 kg)   SpO2 93%   BMI 35.28 kg/m   Wt Readings from Last 3 Encounters:  03/15/16 212 lb (96.2 kg)  03/12/16 218 lb (98.9 kg)  02/22/16 217 lb (98.4 kg)    Physical Exam  Constitutional: She appears well-developed and well-nourished. She appears distressed (does not feel well by appearance, but nontoxic).  HENT:  Head: Normocephalic and atraumatic.  Mouth/Throat: Mucous membranes are normal.  Eyes: EOM are normal. Right conjunctiva is injected. Left conjunctiva is injected. No scleral icterus.  Cardiovascular: Normal rate and regular rhythm.   Pulmonary/Chest: Effort normal. No respiratory distress. She has wheezes. She has no rales.  Skin: She is not diaphoretic. No erythema. No pallor.  Psychiatric: She has a normal  mood and affect. Her behavior is normal.      Assessment & Plan:   Problem List Items Addressed This Visit    None    Visit Diagnoses    Flu-like symptoms    -  Primary   patient looks as though she has flu; treat the patient, not the test result; start Tamiflu; CXR today; start Levaquin if PNA or sx worsen over weekend; see AVS   Relevant Orders   POCT Influenza A/B (Completed)   DG Chest 2 View       Follow up plan: No Follow-up on file.  An after-visit summary was printed and given to the patient at [facility].  Please see the patient instructions which may contain other information and recommendations beyond what is mentioned above in the assessment and plan.  Meds ordered this encounter  Medications  . oseltamivir (TAMIFLU) 75 MG capsule    Sig: Take 1 capsule (75 mg total) by mouth 2 (two) times daily.    Dispense:  10 capsule    Refill:  0  . levofloxacin (LEVAQUIN) 500  MG tablet    Sig: Take 1 tablet (500 mg total) by mouth daily.    Dispense:  7 tablet    Refill:  0    Orders Placed This Encounter  Procedures  . DG Chest 2 View  . POCT Influenza A/B

## 2016-03-15 NOTE — Patient Instructions (Signed)
Go across the street to get the chest xray Start the tamiflu Start the levaquin if pneumonia develops Try vitamin C (orange juice if not diabetic or vitamin C tablets) and drink green tea to help your immune system during your illness Get plenty of rest and hydration Please do eat yogurt daily or take a probiotic daily for the next month or two We want to replace the healthy germs in the gut If you notice foul, watery diarrhea in the next two months, schedule an appointment RIGHT AWAY If you get worse, go to urgent care or the ER

## 2016-03-18 ENCOUNTER — Encounter: Payer: Self-pay | Admitting: Family Medicine

## 2016-03-20 ENCOUNTER — Ambulatory Visit: Payer: Self-pay | Admitting: Psychiatry

## 2016-03-25 ENCOUNTER — Encounter: Payer: Self-pay | Admitting: Family Medicine

## 2016-03-26 ENCOUNTER — Telehealth: Payer: Self-pay | Admitting: Family Medicine

## 2016-03-26 ENCOUNTER — Encounter: Payer: Self-pay | Admitting: Family Medicine

## 2016-03-26 ENCOUNTER — Other Ambulatory Visit: Payer: Self-pay

## 2016-03-26 MED ORDER — PROMETHAZINE HCL 25 MG PO TABS: 25.0000 mg | ORAL_TABLET | Freq: Four times a day (QID) | ORAL | 0 refills | Status: DC | PRN

## 2016-03-26 NOTE — Telephone Encounter (Signed)
Patient walked in with her husband; went to work for a week; tired, has to have tooth implant removed; now on antibiotics; would like note for work; also with GI symptoms, no phenergan at home

## 2016-04-10 ENCOUNTER — Telehealth: Payer: Self-pay

## 2016-04-10 NOTE — Telephone Encounter (Signed)
Patient is wanting to cancel her EGD for the 16th. She will call back to reschedule

## 2016-04-10 NOTE — Telephone Encounter (Signed)
EGD cancelled with [facility].

## 2016-04-12 ENCOUNTER — Encounter: Admission: RE | Payer: Self-pay | Source: Ambulatory Visit

## 2016-04-12 ENCOUNTER — Ambulatory Visit
Admission: RE | Admit: 2016-04-12 | Payer: Managed Care, Other (non HMO) | Source: Ambulatory Visit | Admitting: Gastroenterology

## 2016-04-12 SURGERY — ESOPHAGOGASTRODUODENOSCOPY (EGD) WITH PROPOFOL
Anesthesia: General

## 2016-05-08 IMAGING — MG MM DIAG BREAST TOMO BILATERAL
8 of 15 series · 8 of 31 positions shown · non-contrast
Comparison: 10/07/2012, 01/24/2010, 01/06/2009, additional priors
dating back to 10/28/2006

CLINICAL DATA: 58-year-old female seen for lump felt by the
patient's physician in the left breast at 6 o'clock a few months
ago. The patient does not feel a lump.

EXAM:
DIGITAL DIAGNOSTIC BILATERAL MAMMOGRAM WITH 3D TOMOSYNTHESIS WITH
CAD
ULTRASOUND LEFT BREAST

[L MLO (1 of 3)]
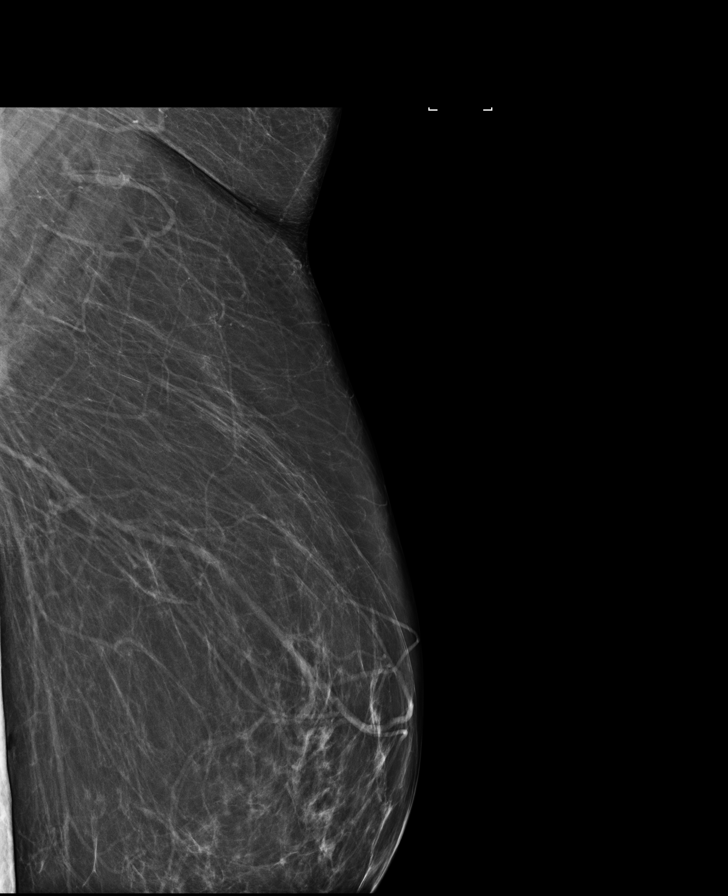

[R MLO (1 of 2)]
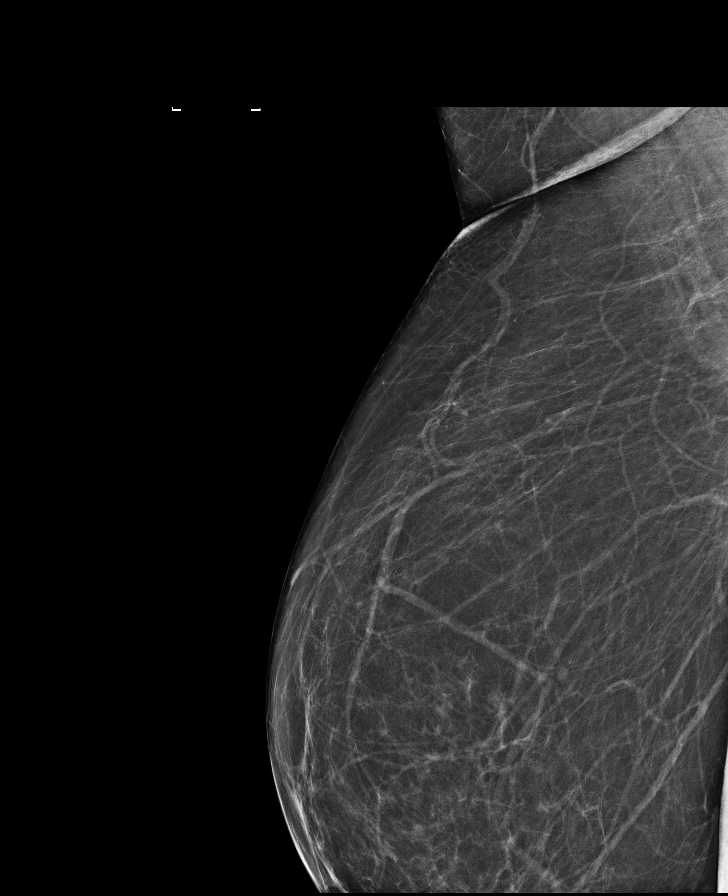

[L MLO (2 of 3)]
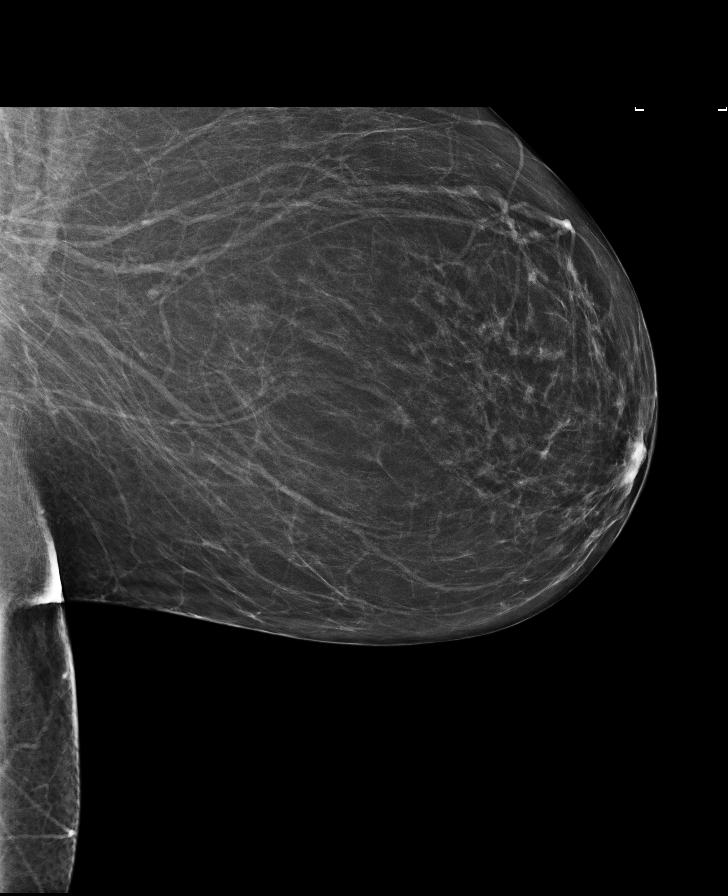

[R CC]
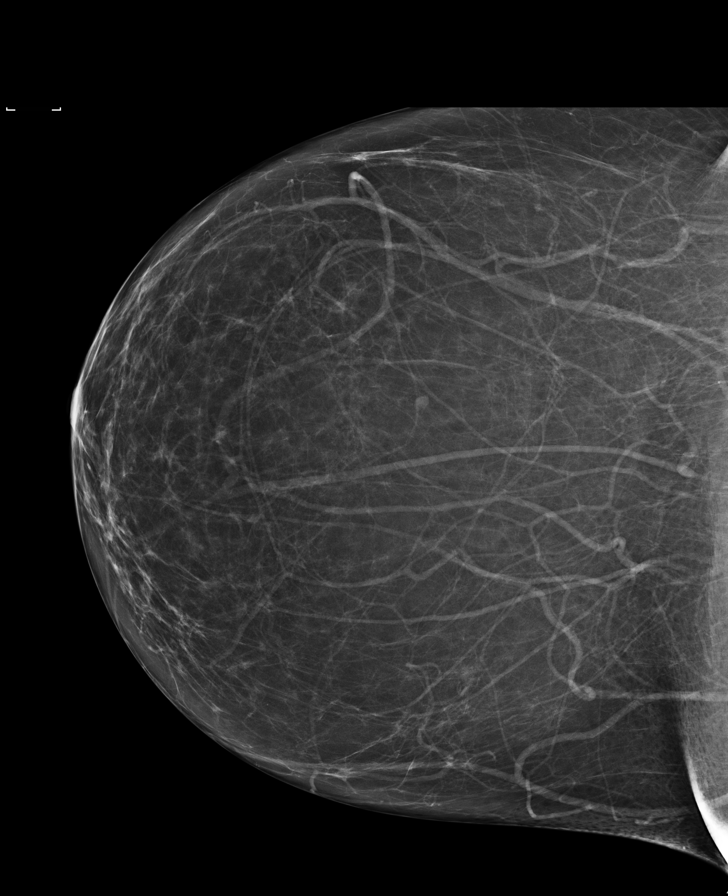

[L MLO (3 of 3)]
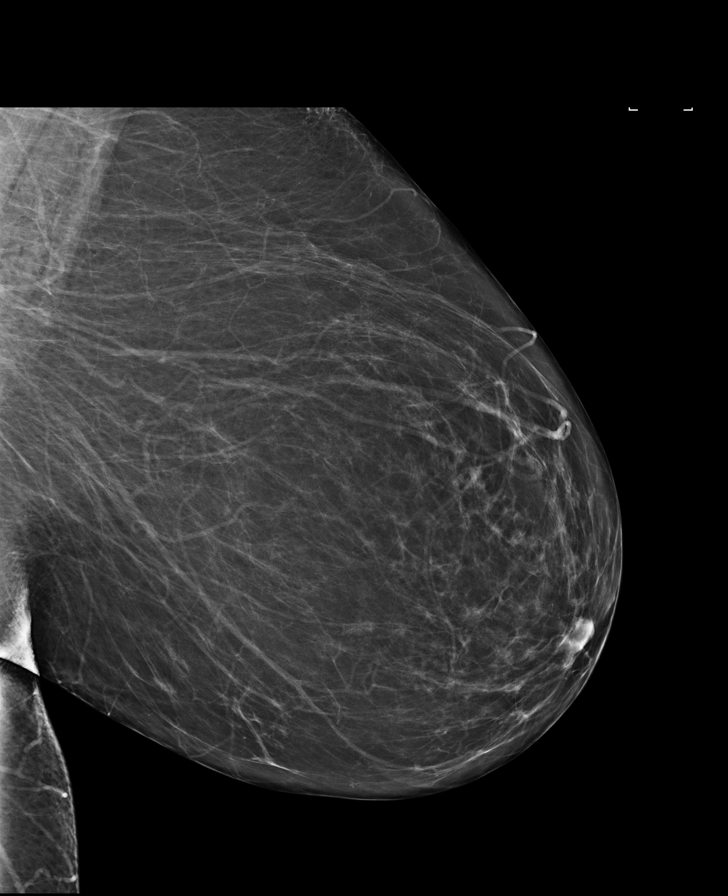

[R MLO (2 of 2)]
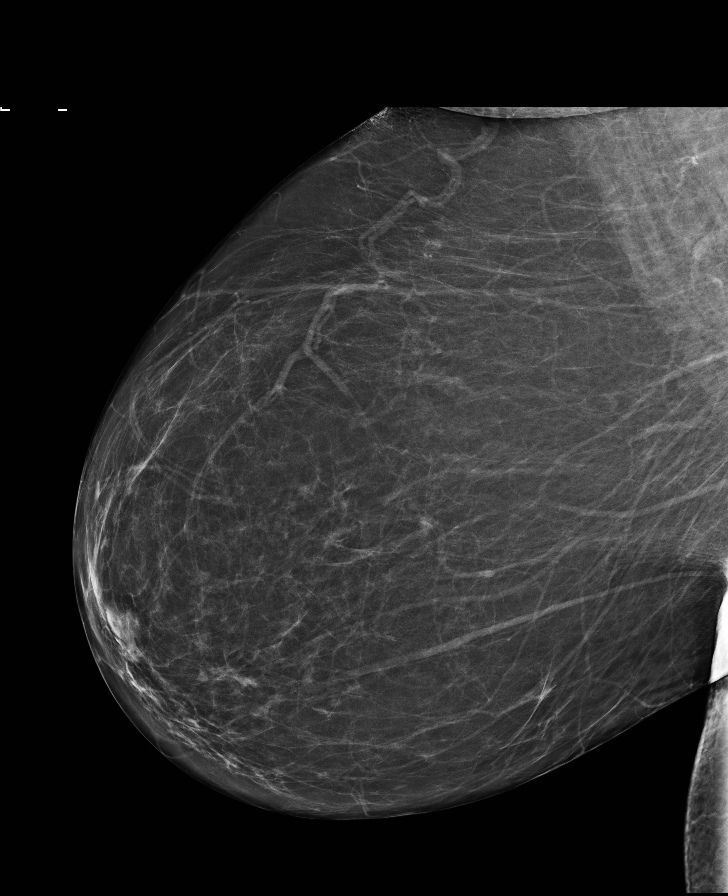

[L CC synth-2D]
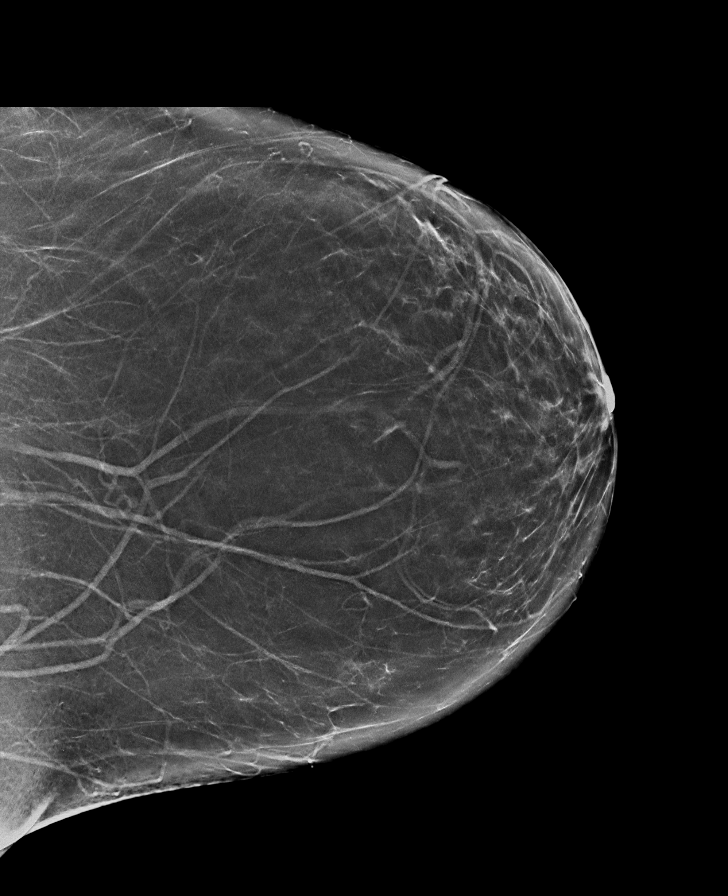

[L MLO synth-2D]
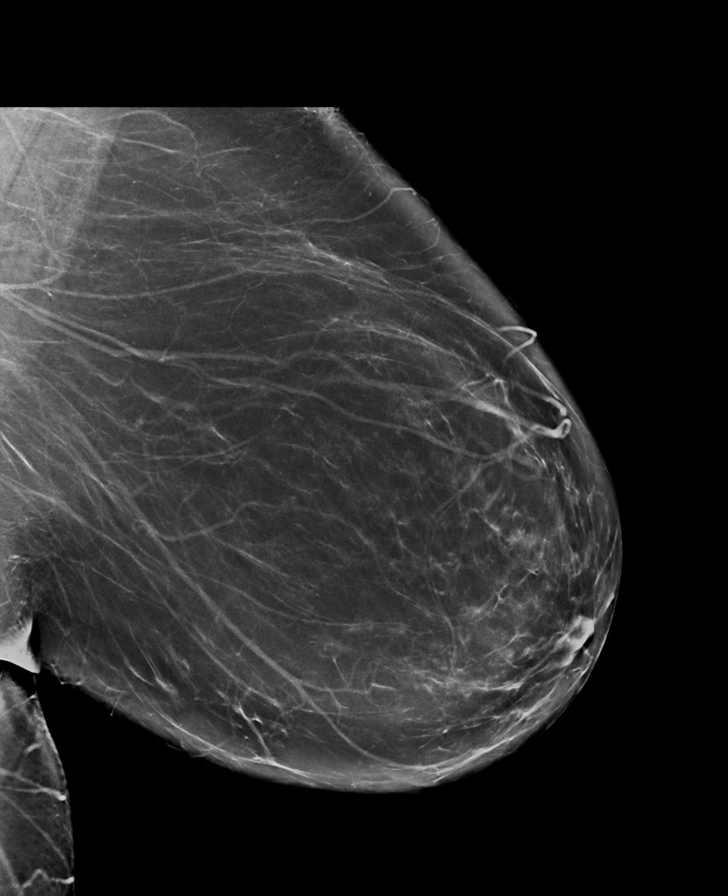

[8 of 31 positions shown; findings below may reference images not displayed]

ACR Breast Density Category b: There are scattered areas of
fibroglandular density.
FINDINGS: No suspicious mass, calcifications, or other abnormality is
identified within either breast.

Mammographic images were processed with CAD.

On physical exam, no discrete mass is felt within the area of
concern within the inferior left breast.

Targeted ultrasound is performed, showing no suspicious cystic or
solid sonographic finding in the area of concern within the inferior
left breast from 5-7 o'clock.
IMPRESSION: No mammographic or sonographic evidence of malignancy.

RECOMMENDATION:
1. Screening mammogram in one year.(Code:[id number])
2. The patient was instructed to follow-up with her referring
physician regarding the area of concern within her inferior left
breast.

I have discussed the findings and recommendations with the patient.
Results were also provided in writing at the conclusion of the
visit. If applicable, a reminder letter will be sent to the patient
regarding the next appointment.

BI-RADS CATEGORY  1: Negative.

## 2016-05-10 ENCOUNTER — Encounter: Payer: Self-pay | Admitting: Family Medicine

## 2016-05-23 ENCOUNTER — Encounter: Payer: Self-pay | Admitting: Family Medicine

## 2016-05-23 ENCOUNTER — Ambulatory Visit (INDEPENDENT_AMBULATORY_CARE_PROVIDER_SITE_OTHER): Payer: Managed Care, Other (non HMO) | Admitting: Family Medicine

## 2016-05-23 VITALS — BP 124/70 | HR 91 | Temp 98.2°F | Resp 16 | Wt 206.2 lb

## 2016-05-23 DIAGNOSIS — E669 Obesity, unspecified: Secondary | ICD-10-CM | POA: Insufficient documentation

## 2016-05-23 DIAGNOSIS — Z720 Tobacco use: Secondary | ICD-10-CM | POA: Diagnosis not present

## 2016-05-23 DIAGNOSIS — E559 Vitamin D deficiency, unspecified: Secondary | ICD-10-CM

## 2016-05-23 DIAGNOSIS — Z1159 Encounter for screening for other viral diseases: Secondary | ICD-10-CM | POA: Diagnosis not present

## 2016-05-23 DIAGNOSIS — R718 Other abnormality of red blood cells: Secondary | ICD-10-CM

## 2016-05-23 DIAGNOSIS — M8589 Other specified disorders of bone density and structure, multiple sites: Secondary | ICD-10-CM | POA: Diagnosis not present

## 2016-05-23 DIAGNOSIS — Z114 Encounter for screening for human immunodeficiency virus [HIV]: Secondary | ICD-10-CM | POA: Diagnosis not present

## 2016-05-23 DIAGNOSIS — R05 Cough: Secondary | ICD-10-CM

## 2016-05-23 DIAGNOSIS — E66811 Obesity, class 1: Secondary | ICD-10-CM

## 2016-05-23 DIAGNOSIS — R059 Cough, unspecified: Secondary | ICD-10-CM

## 2016-05-23 DIAGNOSIS — Z0000 Encounter for general adult medical examination without abnormal findings: Secondary | ICD-10-CM | POA: Insufficient documentation

## 2016-05-23 LAB — CBC WITH DIFFERENTIAL/PLATELET
BASOS ABS: 0 {cells}/uL (ref 0–200)
BASOS PCT: 0 %
EOS PCT: 5 %
Eosinophils Absolute: 440 cells/uL (ref 15–500)
HCT: 44 % (ref 35.0–45.0)
Hemoglobin: 14.5 g/dL (ref 11.7–15.5)
LYMPHS PCT: 20 %
Lymphs Abs: 1760 cells/uL (ref 850–3900)
MCH: 28.7 pg (ref 27.0–33.0)
MCHC: 33 g/dL (ref 32.0–36.0)
MCV: 87.1 fL (ref 80.0–100.0)
MONOS PCT: 7 %
MPV: 10.1 fL (ref 7.5–12.5)
Monocytes Absolute: 616 cells/uL (ref 200–950)
NEUTROS ABS: 5984 {cells}/uL (ref 1500–7800)
Neutrophils Relative %: 68 %
PLATELETS: 289 10*3/uL (ref 140–400)
RBC: 5.05 MIL/uL (ref 3.80–5.10)
RDW: 15 % (ref 11.0–15.0)
WBC: 8.8 10*3/uL (ref 3.8–10.8)

## 2016-05-23 LAB — LIPID PANEL
CHOLESTEROL: 210 mg/dL — AB (ref <200)
HDL: 50 mg/dL — ABNORMAL LOW (ref >50)
LDL Cholesterol: 134 mg/dL — ABNORMAL HIGH (ref <100)
TRIGLYCERIDES: 132 mg/dL (ref <150)
Total CHOL/HDL Ratio: 4.2 Ratio (ref <5.0)
VLDL: 26 mg/dL (ref <30)

## 2016-05-23 LAB — COMPLETE METABOLIC PANEL WITH GFR
ALT: 11 U/L (ref 6–29)
AST: 11 U/L (ref 10–35)
Albumin: 4 g/dL (ref 3.6–5.1)
Alkaline Phosphatase: 58 U/L (ref 33–130)
BILIRUBIN TOTAL: 0.6 mg/dL (ref 0.2–1.2)
BUN: 7 mg/dL (ref 7–25)
CO2: 21 mmol/L (ref 20–31)
CREATININE: 0.87 mg/dL (ref 0.50–1.05)
Calcium: 9.7 mg/dL (ref 8.6–10.4)
Chloride: 107 mmol/L (ref 98–110)
GFR, Est African American: 84 mL/min (ref >=60)
GFR, Est Non African American: 73 mL/min (ref >=60)
GLUCOSE: 99 mg/dL (ref 65–99)
Potassium: 5.1 mmol/L (ref 3.5–5.3)
Sodium: 145 mmol/L (ref 135–146)
TOTAL PROTEIN: 6.5 g/dL (ref 6.1–8.1)

## 2016-05-23 LAB — TSH: TSH: 1.44 mIU/L

## 2016-05-23 LAB — HIV ANTIBODY (ROUTINE TESTING W REFLEX): HIV 1&2 Ab, 4th Generation: NONREACTIVE

## 2016-05-23 NOTE — Assessment & Plan Note (Signed)
Continue 1000 iu daily; last level okay so will skip the expensive lab

## 2016-05-23 NOTE — Assessment & Plan Note (Signed)
Due for DEXA June 10th; fall precautions discussed; three servings of calcium a day

## 2016-05-23 NOTE — Assessment & Plan Note (Signed)
Encouragement given for weight loss 

## 2016-05-23 NOTE — Assessment & Plan Note (Signed)
We'll get the labs for the physical and bring her back for the exam

## 2016-05-23 NOTE — Progress Notes (Signed)
BP 124/70 (BP Location: Left Arm, Patient Position: Sitting, Cuff Size: Normal)   Pulse 91   Temp 98.2 F (36.8 C) (Oral)   Resp 16   Wt 206 lb 3 oz (93.5 kg)   SpO2 96%   BMI 34.31 kg/m    Subjective:    Patient ID: Debra Mccann, female    DOB: 07-15-1955, 61 y.o.   MRN: [id number]  HPI: Debra Mccann is a 61 y.o. female  Chief Complaint  Patient presents with  . Follow-up    3 month    Since last visit, she had an infected dental implant; Dr. Tito Dine took care of her and treated her; took implant out; so much infection in roof of mouth, had to drain from roof; all raw and swollen; much much better; no fevers in the last two weeks  She is doing a lot better than last visit; mood is "good"; sleeping okay  Hx of lung cancer; followed by Dr. Raul Del; he has been doing CXR; goes back in May; no unexplained weight loss, no night sweats, few hormonal things; no sleep study planned; little bit of cough  Osteopenia; gets calcium; walks some Last vit D was 36; taking 1000 iu daily  GERD; out of PPI; went to Dr. Vicente Males (GI) and was supposed to have an EGD to see if there was a stricture; he gave her the omeprazole and that helped her sx  Depression screen [facility] 2/9 05/23/2016 03/15/2016 02/22/2016 01/30/2016 07/06/2015  Decreased Interest - 0 2 3 0  Down, Depressed, Hopeless 1 0 1 3 0  PHQ - 2 Score 1 0 3 6 0  Altered sleeping - - 0 2 -  Tired, decreased energy - - 1 3 -  Change in appetite - - 0 2 -  Feeling bad or failure about yourself  - - 1 3 -  Trouble concentrating - - 1 0 -  Moving slowly or fidgety/restless - - 0 3 -  Suicidal thoughts - - 0 0 -  PHQ-9 Score - - 6 19 -  Difficult doing work/chores - - Somewhat difficult Extremely dIfficult -   Relevant past medical, surgical, family and social history reviewed Past Medical History:  Diagnosis Date  . Asthma   . Bronchitis   . Cancer ([place])    lung cancer  . COPD (chronic obstructive pulmonary disease)  ([place])   . Depression   . Lung cancer ([place]) 2004  . OA (osteoarthritis) of hip    right  . Osteopenia   . Personal history of chemotherapy   . Urticaria    Past Surgical History:  Procedure Laterality Date  . ABDOMINAL HYSTERECTOMY  1997   partial, only ovaries remain; due to heavy bleeding  . CHOLECYSTECTOMY  1999  . LUNG CANCER SURGERY Right 2004   removed "top portion of right lung"   Family History  Problem Relation Age of Onset  . CAD Mother     with stents  . Heart disease Mother   . Hypertension Mother   . Heart disease Father     3 vessel CABG  . Cancer Father     melanoma  . Hypertension Father   . Depression Daughter   . Diabetes Neg Hx   . Stroke Neg Hx   . COPD Neg Hx   . Breast cancer Neg Hx    Social History  Substance Use Topics  . Smoking status: Current Every Day Smoker    Packs/day: 0.50  Years: 23.00  . Smokeless tobacco: Never Used  . Alcohol use No    Interim medical history since last visit reviewed. Allergies and medications reviewed  Review of Systems Per HPI unless specifically indicated above     Objective:    BP 124/70 (BP Location: Left Arm, Patient Position: Sitting, Cuff Size: Normal)   Pulse 91   Temp 98.2 F (36.8 C) (Oral)   Resp 16   Wt 206 lb 3 oz (93.5 kg)   SpO2 96%   BMI 34.31 kg/m   Wt Readings from Last 3 Encounters:  05/23/16 206 lb 3 oz (93.5 kg)  03/15/16 212 lb (96.2 kg)  03/12/16 218 lb (98.9 kg)    Physical Exam  Constitutional: She appears well-developed and well-nourished.  HENT:  Right Ear: Tympanic membrane and ear canal normal.  Left Ear: Tympanic membrane and ear canal normal.  Nose: No rhinorrhea.  Mouth/Throat: Oropharynx is clear and moist and mucous membranes are normal.  Eyes: EOM are normal. No scleral icterus.  Cardiovascular: Normal rate and regular rhythm.   No extrasystoles are present.  Pulmonary/Chest: Effort normal. No respiratory distress. She has wheezes.  Skin: No pallor.    Psychiatric: She has a normal mood and affect. Her behavior is normal. Her mood appears not anxious. Her speech is not delayed. She is not withdrawn. She does not exhibit a depressed mood. She expresses no homicidal and no suicidal ideation.  Good eye contact with examiner; not despondent      Assessment & Plan:   Problem List Items Addressed This Visit      Musculoskeletal and Integument   Osteopenia - Primary    Due for DEXA June 10th; fall precautions discussed; three servings of calcium a day        Other   Vitamin D deficiency    Continue 1000 iu daily; last level okay so will skip the expensive lab      Tobacco abuse    Encouraged cessation; I am here to help if/when she is ready      Preventative health care    We'll get the labs for the physical and bring her back for the exam      Relevant Orders   TSH (Completed)   Lipid panel (Completed)   COMPLETE METABOLIC PANEL WITH GFR (Completed)   Obesity (BMI 30.0-34.9)    Encouragement given for weight loss      Elevated red blood cell count    Managed by Dr. Raul Del      Relevant Orders   CBC with Differential/Platelet (Completed)   Cough    Followed by Dr. Raul Del, goes back in May; having CXR       Other Visit Diagnoses    Need for hepatitis C screening test       Relevant Orders   Hepatitis C Antibody (Completed)   Encounter for screening for HIV       Relevant Orders   HIV antibody (with reflex) (Completed)       Follow up plan: Return in about 6 weeks (around 07/04/2016) for complete physical (no labs then).  An after-visit summary was printed and given to the patient at [facility].  Please see the patient instructions which may contain other information and recommendations beyond what is mentioned above in the assessment and plan.  Meds ordered this encounter  Medications  . DISCONTD: ADVAIR DISKUS 250-50 MCG/DOSE AEPB  . Multiple Vitamin (MULTIVITAMIN WITH MINERALS) TABS tablet    Sig: Take 1  tablet by mouth daily.    Orders Placed This Encounter  Procedures  . Hepatitis C Antibody  . HIV antibody (with reflex)  . CBC with Differential/Platelet  . TSH  . Lipid panel  . COMPLETE METABOLIC PANEL WITH GFR

## 2016-05-23 NOTE — Assessment & Plan Note (Signed)
Followed by Dr. Raul Del, goes back in May; having CXR

## 2016-05-23 NOTE — Patient Instructions (Signed)
Please do call to schedule your bone density study; the number to schedule one at either [facility] or [facility] is [phone]; that will be due on or after August 04, 2016 I do encourage you to quit smoking Call [phone] to sign up for smoking cessation classes You can call 1-800-QUIT-NOW to talk with a smoking cessation coach Return for physical in the next month or two

## 2016-05-23 NOTE — Assessment & Plan Note (Signed)
Encouraged cessation; I am here to help if/when she is ready

## 2016-05-23 NOTE — Assessment & Plan Note (Signed)
Managed by Dr. Raul Del

## 2016-05-23 NOTE — Assessment & Plan Note (Signed)
>>  ASSESSMENT AND PLAN FOR ELEVATED RED BLOOD CELL COUNT WRITTEN ON 05/23/2016  9:38 AM BY LADA, MELINDA P, MD  Managed by Dr. Meredeth Ide

## 2016-05-24 LAB — HEPATITIS C ANTIBODY: HCV AB: NEGATIVE

## 2016-06-01 ENCOUNTER — Encounter: Payer: Self-pay | Admitting: Family Medicine

## 2016-07-05 ENCOUNTER — Encounter: Payer: Self-pay | Admitting: Family Medicine

## 2016-07-08 ENCOUNTER — Other Ambulatory Visit: Payer: Self-pay

## 2016-07-08 DIAGNOSIS — F321 Major depressive disorder, single episode, moderate: Secondary | ICD-10-CM

## 2016-07-08 NOTE — Telephone Encounter (Signed)
Please type letter for patient's absence Thursday and Friday, May 10th and May 11th Please re-enter psych referral or give patient number to f/u (see her note)

## 2016-07-18 ENCOUNTER — Ambulatory Visit: Payer: Managed Care, Other (non HMO) | Admitting: Family Medicine

## 2016-08-06 ENCOUNTER — Other Ambulatory Visit: Payer: Self-pay | Admitting: Family Medicine

## 2016-10-22 ENCOUNTER — Other Ambulatory Visit: Payer: Self-pay | Admitting: Family Medicine

## 2016-10-22 ENCOUNTER — Ambulatory Visit (INDEPENDENT_AMBULATORY_CARE_PROVIDER_SITE_OTHER): Payer: BLUE CROSS/BLUE SHIELD | Admitting: Family Medicine

## 2016-10-22 ENCOUNTER — Encounter: Payer: Self-pay | Admitting: Family Medicine

## 2016-10-22 VITALS — BP 122/84 | HR 100 | Temp 98.3°F | Resp 16 | Wt 211.9 lb

## 2016-10-22 DIAGNOSIS — Z23 Encounter for immunization: Secondary | ICD-10-CM | POA: Diagnosis not present

## 2016-10-22 DIAGNOSIS — Z720 Tobacco use: Secondary | ICD-10-CM | POA: Diagnosis not present

## 2016-10-22 DIAGNOSIS — E669 Obesity, unspecified: Secondary | ICD-10-CM

## 2016-10-22 DIAGNOSIS — B977 Papillomavirus as the cause of diseases classified elsewhere: Secondary | ICD-10-CM | POA: Diagnosis not present

## 2016-10-22 DIAGNOSIS — N898 Other specified noninflammatory disorders of vagina: Secondary | ICD-10-CM

## 2016-10-22 DIAGNOSIS — Z0000 Encounter for general adult medical examination without abnormal findings: Secondary | ICD-10-CM

## 2016-10-22 HISTORY — DX: Papillomavirus as the cause of diseases classified elsewhere: B97.7

## 2016-10-22 MED ORDER — DULOXETINE HCL 60 MG PO CPEP: 60.0000 mg | ORAL_CAPSULE | Freq: Every day | ORAL | 6 refills | Status: DC

## 2016-10-22 NOTE — Progress Notes (Signed)
Patient ID: Debra Mccann, female   DOB: 12-02-1955, 61 y.o.   MRN: [id number]   Subjective:   Debra Mccann is a 61 y.o. female here for a complete physical exam  Interim issues since last visit: none  USPSTF grade A and B recommendations Depression:  Depression screen [facility] 2/9 10/22/2016 05/23/2016 03/15/2016 02/22/2016 01/30/2016  Decreased Interest 0 - 0 2 3  Down, Depressed, Hopeless 0 1 0 1 3  PHQ - 2 Score 0 1 0 3 6  Altered sleeping - - - 0 2  Tired, decreased energy - - - 1 3  Change in appetite - - - 0 2  Feeling bad or failure about yourself  - - - 1 3  Trouble concentrating - - - 1 0  Moving slowly or fidgety/restless - - - 0 3  Suicidal thoughts - - - 0 0  PHQ-9 Score - - - 6 19  Difficult doing work/chores - - - Somewhat difficult Extremely dIfficult   Hypertension: BP Readings from Last 3 Encounters:  10/22/16 122/84  05/23/16 124/70  03/15/16 126/82   Obesity: will try to increase activity; not dirnking enough water Wt Readings from Last 3 Encounters:  10/22/16 211 lb 14.4 oz (96.1 kg)  05/23/16 206 lb 3 oz (93.5 kg)  03/15/16 212 lb (96.2 kg)   BMI Readings from Last 3 Encounters:  10/22/16 35.26 kg/m  05/23/16 34.31 kg/m  03/15/16 35.28 kg/m    Alcohol: no Tobacco use: still smoking, less than 1 ppd, still too many; changed jobs; working in [employer] now; program to help her HIV, hep B, hep C: already done STD testing and prevention (chl/gon/syphilis): no new sx; declined Intimate partner violence: no Breast cancer: no lumps, just had mammo BRCA gene screening: no fam hx of breast, ovarian; not sure about met prostate ca; father's brothers had prostate cancer; father did not; father had melanoma Cervical cancer screening: today; saw GYN, sounds like she had colpo followed by paps every 6 months and she says they never found anything Osteoporosis: hot flashes in 51s; start screening age 33 Fall prevention/vitamin D: discussed; taking  multivitamin for over 50 years, contains vit D Lipids: not fasting today, done in March Lab Results  Component Value Date   CHOL 210 (H) 05/23/2016   Lab Results  Component Value Date   HDL 50 (L) 05/23/2016   Lab Results  Component Value Date   LDLCALC 134 (H) 05/23/2016   Lab Results  Component Value Date   TRIG 132 05/23/2016   Lab Results  Component Value Date   CHOLHDL 4.2 05/23/2016   No results found for: LDLDIRECT Glucose:  Glucose  Date Value Ref Range Status  06/08/2015 91 65 - 99 mg/dL Final  01/30/2015 84 65 - 99 mg/dL Final   Glucose, Bld  Date Value Ref Range Status  05/23/2016 99 65 - 99 mg/dL Final  01/30/2016 88 65 - 99 mg/dL Final   Colorectal cancer: due Feb 2019; reminder sent Lung cancer:  Sees Dr. Grayland Ormond; just saw him a few months ago; next appt Dec; he'll do CXR and spirometr or something like that AAA: n/a Aspirin: tried aspirin but got nose bleeds Diet: tries to get a good mix; not a big appetite; does not eat a lot at any meal; not really hungry Exercise: nothing regular; knows she needs to, but tired after working all day; will try to commit 10 min a day 3 days a week to start Skin cancer:  no worrisome; saw dermatologist last year; nothing since  Past Medical History:  Diagnosis Date  . Asthma   . Bronchitis   . Cancer ([place])    lung cancer  . COPD (chronic obstructive pulmonary disease) ([place])   . Depression   . HPV (human papilloma virus) infection 10/22/2016   Evaluated by [place]  . Lung cancer ([place]) 2004  . OA (osteoarthritis) of hip    right  . Osteopenia   . Personal history of chemotherapy   . Urticaria    Past Surgical History:  Procedure Laterality Date  . ABDOMINAL HYSTERECTOMY  1997   partial, only ovaries remain; due to heavy bleeding  . CHOLECYSTECTOMY  1999  . LUNG CANCER SURGERY Right 2004   removed "top portion of right lung"   Family History  Problem Relation Age of Onset  . CAD Mother         with stents  . Heart disease Mother   . Hypertension Mother   . Heart disease Father        3 vessel CABG  . Cancer Father        melanoma  . Hypertension Father   . Gout Father   . Depression Daughter   . Alzheimer's disease Maternal Grandmother   . Diabetes Neg Hx   . Stroke Neg Hx   . COPD Neg Hx   . Breast cancer Neg Hx    Social History  Substance Use Topics  . Smoking status: Current Every Day Smoker    Packs/day: 0.50    Years: 23.00  . Smokeless tobacco: Never Used  . Alcohol use No   Review of Systems  Objective:   Vitals:   10/22/16 1407  BP: 122/84  Pulse: 100  Resp: 16  Temp: 98.3 F (36.8 C)  TempSrc: Oral  SpO2: 95%  Weight: 211 lb 14.4 oz (96.1 kg)   Body mass index is 35.26 kg/m. Wt Readings from Last 3 Encounters:  10/22/16 211 lb 14.4 oz (96.1 kg)  05/23/16 206 lb 3 oz (93.5 kg)  03/15/16 212 lb (96.2 kg)   Physical Exam  Constitutional: She appears well-developed and well-nourished.  HENT:  Head: Normocephalic and atraumatic.  Eyes: Conjunctivae and EOM are normal. Right eye exhibits no hordeolum. Left eye exhibits no hordeolum. No scleral icterus.  Neck: Carotid bruit is not present. No thyromegaly present.  Cardiovascular: Normal rate, regular rhythm, S1 normal, S2 normal and normal heart sounds.   No extrasystoles are present.  Pulmonary/Chest: Effort normal and breath sounds normal. No respiratory distress. Right breast exhibits no inverted nipple, no mass, no nipple discharge, no skin change and no tenderness. Left breast exhibits no inverted nipple, no mass, no nipple discharge, no skin change and no tenderness. Breasts are symmetrical.  Abdominal: Soft. Normal appearance and bowel sounds are normal. She exhibits no distension, no abdominal bruit, no pulsatile midline mass and no mass. There is no hepatosplenomegaly. There is no tenderness. No hernia.  Genitourinary: Uterus normal. Pelvic exam was performed with patient prone. There is  no rash or lesion on the right labia. There is no rash or lesion on the left labia. Cervix exhibits no motion tenderness. Right adnexum displays no mass, no tenderness and no fullness. Left adnexum displays no mass, no tenderness and no fullness. No erythema in the vagina. Vaginal discharge (whitish thin discharge, wet prep collected) found.  Musculoskeletal: Normal range of motion. She exhibits no edema.  Lymphadenopathy:  Head (right side): No submandibular adenopathy present.       Head (left side): No submandibular adenopathy present.    She has no cervical adenopathy.    She has no axillary adenopathy.  Neurological: She is alert. She displays no tremor. No cranial nerve deficit. She exhibits normal muscle tone. Gait normal.  Skin: Skin is warm and dry. No bruising and no ecchymosis noted. No cyanosis. No pallor.  Psychiatric: Her speech is normal and behavior is normal. Thought content normal. Her mood appears not anxious. She does not exhibit a depressed mood.    Assessment/Plan:   Problem List Items Addressed This Visit      Other   Tobacco abuse    Encouraged cessation; see AVS      Preventative health care - Primary    USPSTF grade A and B recommendations reviewed with patient; age-appropriate recommendations, preventive care, screening tests, etc discussed and encouraged; healthy living encouraged; see AVS for patient education given to patient      Relevant Orders   CBC with Differential/Platelet   COMPLETE METABOLIC PANEL WITH GFR   Lipid panel   Hemoglobin A1c   TSH   Obesity, Class II, BMI 35-39.9, no comorbidity    Encouraged weight loss, move more, drink more water, see AVS      HPV (human papilloma virus) infection    Previous LGSIL, HPV(+) will get pap smear today      Relevant Orders   Pap IG and HPV (high risk) DNA detection    Other Visit Diagnoses    Needs flu shot       offered, given   Relevant Orders   Flu Vaccine QUAD 6+ mos PF IM (Fluarix  Quad PF) (Completed)   Vaginal discharge       wet mount collected; will treat for BV and/or yeast accordingly   Relevant Orders   WET PREP BY MOLECULAR PROBE      Meds ordered this encounter  Medications  . DULoxetine (CYMBALTA) 60 MG capsule    Sig: Take 1 capsule (60 mg total) by mouth daily.    Dispense:  30 capsule    Refill:  6   Orders Placed This Encounter  Procedures  . WET PREP BY MOLECULAR PROBE  . Flu Vaccine QUAD 6+ mos PF IM (Fluarix Quad PF)  . CBC with Differential/Platelet  . COMPLETE METABOLIC PANEL WITH GFR  . Lipid panel  . Hemoglobin A1c  . TSH    Follow up plan: Return in about 1 year (around 10/22/2017) for complete physical; due for visit and fasting labs Sept 30th or later.  An After Visit Summary was printed and given to the patient.

## 2016-10-22 NOTE — Assessment & Plan Note (Signed)
Encouraged cessation; see AVS 

## 2016-10-22 NOTE — Assessment & Plan Note (Signed)
USPSTF grade A and B recommendations reviewed with patient; age-appropriate recommendations, preventive care, screening tests, etc discussed and encouraged; healthy living encouraged; see AVS for patient education given to patient  

## 2016-10-22 NOTE — Assessment & Plan Note (Signed)
Encouraged weight loss, move more, drink more water, see AVS

## 2016-10-22 NOTE — Assessment & Plan Note (Signed)
Previous LGSIL, HPV(+) will get pap smear today

## 2016-10-22 NOTE — Patient Instructions (Addendum)
Try daily coated 81 mg aspirin; if not tolerated, back down to maybe even just 1 or 2 times a week  I do encourage you to quit smoking Call [phone] to sign up for smoking cessation classes You can call 1-800-QUIT-NOW to talk with a smoking cessation coach  Check out the information at familydoctor.org entitled "Nutrition for Weight Loss: What You Need to Know about Fad Diets" Try to lose between 1-2 pounds per week by taking in fewer calories and burning off more calories You can succeed by limiting portions, limiting foods dense in calories and fat, becoming more active, and drinking 8 glasses of water a day (64 ounces) Don't skip meals, especially breakfast, as skipping meals may alter your metabolism Do not use over-the-counter weight loss pills or gimmicks that claim rapid weight loss A healthy BMI (or body mass index) is between 18.5 and 24.9 You can calculate your ideal BMI at the [place] website [employer]fr  Health Maintenance, Female Adopting a healthy lifestyle and getting preventive care can go a long way to promote health and wellness. Talk with your health care provider about what schedule of regular examinations is right for you. This is a good chance for you to check in with your provider about disease prevention and staying healthy. In between checkups, there are plenty of things you can do on your own. Experts have done a lot of research about which lifestyle changes and preventive measures are most likely to keep you healthy. Ask your health care provider for more information. Weight and diet Eat a healthy diet  Be sure to include plenty of vegetables, fruits, low-fat dairy products, and lean protein.  Do not eat a lot of foods high in solid fats, added sugars, or salt.  Get regular exercise. This is one of the most important things you can do for your health. ? Most adults should exercise for at least 150 minutes each  week. The exercise should increase your heart rate and make you sweat (moderate-intensity exercise). ? Most adults should also do strengthening exercises at least twice a week. This is in addition to the moderate-intensity exercise.  Maintain a healthy weight  Body mass index (BMI) is a measurement that can be used to identify possible weight problems. It estimates body fat based on height and weight. Your health care provider can help determine your BMI and help you achieve or maintain a healthy weight.  For females 44 years of age and older: ? A BMI below 18.5 is considered underweight. ? A BMI of 18.5 to 24.9 is normal. ? A BMI of 25 to 29.9 is considered overweight. ? A BMI of 30 and above is considered obese.  Watch levels of cholesterol and blood lipids  You should start having your blood tested for lipids and cholesterol at 60 years of age, then have this test every 5 years.  You may need to have your cholesterol levels checked more often if: ? Your lipid or cholesterol levels are high. ? You are older than 60 years of age. ? You are at high risk for heart disease.  Cancer screening Lung Cancer  Lung cancer screening is recommended for adults 15-66 years old who are at high risk for lung cancer because of a history of smoking.  A yearly low-dose CT scan of the lungs is recommended for people who: ? Currently smoke. ? Have quit within the past 15 years. ? Have at least a 30-pack-year history of smoking. A pack year is smoking  an average of one pack of cigarettes a day for 1 year.  Yearly screening should continue until it has been 15 years since you quit.  Yearly screening should stop if you develop a health problem that would prevent you from having lung cancer treatment.  Breast Cancer  Practice breast self-awareness. This means understanding how your breasts normally appear and feel.  It also means doing regular breast self-exams. Let your health care provider know  about any changes, no matter how small.  If you are in your 20s or 30s, you should have a clinical breast exam (CBE) by a health care provider every 1-3 years as part of a regular health exam.  If you are 55 or older, have a CBE every year. Also consider having a breast X-ray (mammogram) every year.  If you have a family history of breast cancer, talk to your health care provider about genetic screening.  If you are at high risk for breast cancer, talk to your health care provider about having an MRI and a mammogram every year.  Breast cancer gene (BRCA) assessment is recommended for women who have family members with BRCA-related cancers. BRCA-related cancers include: ? Breast. ? Ovarian. ? Tubal. ? Peritoneal cancers.  Results of the assessment will determine the need for genetic counseling and BRCA1 and BRCA2 testing.  Cervical Cancer Your health care provider may recommend that you be screened regularly for cancer of the pelvic organs (ovaries, uterus, and vagina). This screening involves a pelvic examination, including checking for microscopic changes to the surface of your cervix (Pap test). You may be encouraged to have this screening done every 3 years, beginning at age 27.  For women ages 69-65, health care providers may recommend pelvic exams and Pap testing every 3 years, or they may recommend the Pap and pelvic exam, combined with testing for human papilloma virus (HPV), every 5 years. Some types of HPV increase your risk of cervical cancer. Testing for HPV may also be done on women of any age with unclear Pap test results.  Other health care providers may not recommend any screening for nonpregnant women who are considered low risk for pelvic cancer and who do not have symptoms. Ask your health care provider if a screening pelvic exam is right for you.  If you have had past treatment for cervical cancer or a condition that could lead to cancer, you need Pap tests and screening  for cancer for at least 20 years after your treatment. If Pap tests have been discontinued, your risk factors (such as having a new sexual partner) need to be reassessed to determine if screening should resume. Some women have medical problems that increase the chance of getting cervical cancer. In these cases, your health care provider may recommend more frequent screening and Pap tests.  Colorectal Cancer  This type of cancer can be detected and often prevented.  Routine colorectal cancer screening usually begins at 60 years of age and continues through 60 years of age.  Your health care provider may recommend screening at an earlier age if you have risk factors for colon cancer.  Your health care provider may also recommend using home test kits to check for hidden blood in the stool.  A small camera at the end of a tube can be used to examine your colon directly (sigmoidoscopy or colonoscopy). This is done to check for the earliest forms of colorectal cancer.  Routine screening usually begins at age 9.  Direct examination of  the colon should be repeated every 5-10 years through 60 years of age. However, you may need to be screened more often if early forms of precancerous polyps or small growths are found.  Skin Cancer  Check your skin from head to toe regularly.  Tell your health care provider about any new moles or changes in moles, especially if there is a change in a mole's shape or color.  Also tell your health care provider if you have a mole that is larger than the size of a pencil eraser.  Always use sunscreen. Apply sunscreen liberally and repeatedly throughout the day.  Protect yourself by wearing long sleeves, pants, a wide-brimmed hat, and sunglasses whenever you are outside.  Heart disease, diabetes, and high blood pressure  High blood pressure causes heart disease and increases the risk of stroke. High blood pressure is more likely to develop in: ? People who have  blood pressure in the high end of the normal range (130-139/85-89 mm Hg). ? People who are overweight or obese. ? People who are African American.  If you are 46-37 years of age, have your blood pressure checked every 3-5 years. If you are 69 years of age or older, have your blood pressure checked every year. You should have your blood pressure measured twice-once when you are at a hospital or clinic, and once when you are not at a hospital or clinic. Record the average of the two measurements. To check your blood pressure when you are not at a hospital or clinic, you can use: ? An automated blood pressure machine at a pharmacy. ? A home blood pressure monitor.  If you are between 35 years and 46 years old, ask your health care provider if you should take aspirin to prevent strokes.  Have regular diabetes screenings. This involves taking a blood sample to check your fasting blood sugar level. ? If you are at a normal weight and have a low risk for diabetes, have this test once every three years after 60 years of age. ? If you are overweight and have a high risk for diabetes, consider being tested at a younger age or more often. Preventing infection Hepatitis B  If you have a higher risk for hepatitis B, you should be screened for this virus. You are considered at high risk for hepatitis B if: ? You were born in a country where hepatitis B is common. Ask your health care provider which countries are considered high risk. ? Your parents were born in a high-risk country, and you have not been immunized against hepatitis B (hepatitis B vaccine). ? You have HIV or AIDS. ? You use needles to inject street drugs. ? You live with someone who has hepatitis B. ? You have had sex with someone who has hepatitis B. ? You get hemodialysis treatment. ? You take certain medicines for conditions, including cancer, organ transplantation, and autoimmune conditions.  Hepatitis C  Blood testing is recommended  for: ? Everyone born from [birth year] through 1964. ? Anyone with known risk factors for hepatitis C.  Sexually transmitted infections (STIs)  You should be screened for sexually transmitted infections (STIs) including gonorrhea and chlamydia if: ? You are sexually active and are younger than 60 years of age. ? You are older than 60 years of age and your health care provider tells you that you are at risk for this type of infection. ? Your sexual activity has changed since you were last screened and you are at  an increased risk for chlamydia or gonorrhea. Ask your health care provider if you are at risk.  If you do not have HIV, but are at risk, it may be recommended that you take a prescription medicine daily to prevent HIV infection. This is called pre-exposure prophylaxis (PrEP). You are considered at risk if: ? You are sexually active and do not regularly use condoms or know the HIV status of your partner(s). ? You take drugs by injection. ? You are sexually active with a partner who has HIV.  Talk with your health care provider about whether you are at high risk of being infected with HIV. If you choose to begin PrEP, you should first be tested for HIV. You should then be tested every 3 months for as long as you are taking PrEP. Pregnancy  If you are premenopausal and you may become pregnant, ask your health care provider about preconception counseling.  If you may become pregnant, take 400 to 800 micrograms (mcg) of folic acid every day.  If you want to prevent pregnancy, talk to your health care provider about birth control (contraception). Osteoporosis and menopause  Osteoporosis is a disease in which the bones lose minerals and strength with aging. This can result in serious bone fractures. Your risk for osteoporosis can be identified using a bone density scan.  If you are 60 years of age or older, or if you are at risk for osteoporosis and fractures, ask your health care provider if  you should be screened.  Ask your health care provider whether you should take a calcium or vitamin D supplement to lower your risk for osteoporosis.  Menopause may have certain physical symptoms and risks.  Hormone replacement therapy may reduce some of these symptoms and risks. Talk to your health care provider about whether hormone replacement therapy is right for you. Follow these instructions at home:  Schedule regular health, dental, and eye exams.  Stay current with your immunizations.  Do not use any tobacco products including cigarettes, chewing tobacco, or electronic cigarettes.  If you are pregnant, do not drink alcohol.  If you are breastfeeding, limit how much and how often you drink alcohol.  Limit alcohol intake to no more than 1 drink per day for nonpregnant women. One drink equals 12 ounces of beer, 5 ounces of wine, or 1 ounces of hard liquor.  Do not use street drugs.  Do not share needles.  Ask your health care provider for help if you need support or information about quitting drugs.  Tell your health care provider if you often feel depressed.  Tell your health care provider if you have ever been abused or do not feel safe at home. This information is not intended to replace advice given to you by your health care provider. Make sure you discuss any questions you have with your health care provider. Document Released: 08/27/2010 Document Revised: 07/20/2015 Document Reviewed: 11/15/2014 Elsevier Interactive Patient Education  [employer].

## 2016-10-23 ENCOUNTER — Encounter: Payer: Self-pay | Admitting: Family Medicine

## 2016-10-23 ENCOUNTER — Other Ambulatory Visit: Payer: Self-pay | Admitting: Family Medicine

## 2016-10-23 LAB — CBC WITH DIFFERENTIAL/PLATELET
BASOS PCT: 0 %
Basophils Absolute: 0 cells/uL (ref 0–200)
EOS ABS: 600 {cells}/uL — AB (ref 15–500)
Eosinophils Relative: 6 %
HCT: 42.7 % (ref 35.0–45.0)
HEMOGLOBIN: 13.9 g/dL (ref 11.7–15.5)
LYMPHS ABS: 2100 {cells}/uL (ref 850–3900)
Lymphocytes Relative: 21 %
MCH: 28.1 pg (ref 27.0–33.0)
MCHC: 32.6 g/dL (ref 32.0–36.0)
MCV: 86.4 fL (ref 80.0–100.0)
MONOS PCT: 8 %
MPV: 10.1 fL (ref 7.5–12.5)
Monocytes Absolute: 800 cells/uL (ref 200–950)
NEUTROS ABS: 6500 {cells}/uL (ref 1500–7800)
Neutrophils Relative %: 65 %
Platelets: 290 10*3/uL (ref 140–400)
RBC: 4.94 MIL/uL (ref 3.80–5.10)
RDW: 15.6 % — AB (ref 11.0–15.0)
WBC: 10 10*3/uL (ref 3.8–10.8)

## 2016-10-23 LAB — WET PREP BY MOLECULAR PROBE
CANDIDA SPECIES: DETECTED — AB
Gardnerella vaginalis: NOT DETECTED
Trichomonas vaginosis: NOT DETECTED

## 2016-10-23 MED ORDER — FLUCONAZOLE 150 MG PO TABS: 150.0000 mg | ORAL_TABLET | Freq: Once | ORAL | 0 refills | Status: AC

## 2016-10-23 NOTE — Progress Notes (Signed)
rx for yeast infection

## 2016-10-24 ENCOUNTER — Encounter: Payer: Self-pay | Admitting: Family Medicine

## 2016-10-24 ENCOUNTER — Other Ambulatory Visit: Payer: Self-pay | Admitting: Family Medicine

## 2016-10-24 DIAGNOSIS — R87810 Cervical high risk human papillomavirus (HPV) DNA test positive: Secondary | ICD-10-CM

## 2016-10-24 DIAGNOSIS — E785 Hyperlipidemia, unspecified: Secondary | ICD-10-CM | POA: Insufficient documentation

## 2016-10-24 DIAGNOSIS — R87612 Low grade squamous intraepithelial lesion on cytologic smear of cervix (LGSIL): Secondary | ICD-10-CM

## 2016-10-24 HISTORY — DX: Low grade squamous intraepithelial lesion on cytologic smear of cervix (LGSIL): R87.612

## 2016-10-24 HISTORY — DX: Cervical high risk human papillomavirus (HPV) DNA test positive: R87.810

## 2016-10-24 LAB — HEMOGLOBIN A1C
Hgb A1c MFr Bld: 5 % (ref <5.7)
Mean Plasma Glucose: 97 mg/dL

## 2016-10-24 LAB — COMPLETE METABOLIC PANEL WITH GFR
ALBUMIN: 3.9 g/dL (ref 3.6–5.1)
ALK PHOS: 55 U/L (ref 33–130)
ALT: 11 U/L (ref 6–29)
AST: 12 U/L (ref 10–35)
BUN: 12 mg/dL (ref 7–25)
CALCIUM: 9.2 mg/dL (ref 8.6–10.4)
CHLORIDE: 106 mmol/L (ref 98–110)
CO2: 23 mmol/L (ref 20–32)
Creat: 0.83 mg/dL (ref 0.50–1.05)
GFR, EST NON AFRICAN AMERICAN: 77 mL/min (ref >=60)
GFR, Est African American: 89 mL/min (ref >=60)
Glucose, Bld: 100 mg/dL — ABNORMAL HIGH (ref 65–99)
POTASSIUM: 4 mmol/L (ref 3.5–5.3)
Sodium: 142 mmol/L (ref 135–146)
Total Bilirubin: 0.5 mg/dL (ref 0.2–1.2)
Total Protein: 6.2 g/dL (ref 6.1–8.1)

## 2016-10-24 LAB — PAP IG AND HPV HIGH-RISK: HPV DNA High Risk: DETECTED — AB

## 2016-10-24 LAB — LIPID PANEL
CHOL/HDL RATIO: 4.1 ratio (ref <5.0)
CHOLESTEROL: 219 mg/dL — AB (ref <200)
HDL: 54 mg/dL (ref >50)
LDL Cholesterol: 126 mg/dL — ABNORMAL HIGH (ref <100)
TRIGLYCERIDES: 197 mg/dL — AB (ref <150)
VLDL: 39 mg/dL — AB (ref <30)

## 2016-10-24 LAB — TSH: TSH: 2.9 mIU/L

## 2016-10-24 MED ORDER — PRAVASTATIN SODIUM 20 MG PO TABS: 20.0000 mg | ORAL_TABLET | Freq: Every day | ORAL | 1 refills | Status: DC

## 2016-10-24 NOTE — Assessment & Plan Note (Signed)
Start lower dose statin; check lipids in 6-8 weeks

## 2016-10-24 NOTE — Progress Notes (Signed)
Refer to GYN. 

## 2016-10-24 NOTE — Progress Notes (Signed)
Statin initiation; check lipids in 6-8 weeks

## 2016-10-24 NOTE — Assessment & Plan Note (Signed)
Refer to gyn 

## 2016-10-24 NOTE — Assessment & Plan Note (Signed)
Refer to GYN. 

## 2016-11-20 ENCOUNTER — Ambulatory Visit (INDEPENDENT_AMBULATORY_CARE_PROVIDER_SITE_OTHER): Payer: BLUE CROSS/BLUE SHIELD | Admitting: Obstetrics and Gynecology

## 2016-11-20 ENCOUNTER — Encounter: Payer: Self-pay | Admitting: Obstetrics and Gynecology

## 2016-11-20 VITALS — BP 136/82 | HR 96 | Ht 65.0 in | Wt 216.0 lb

## 2016-11-20 DIAGNOSIS — R87810 Cervical high risk human papillomavirus (HPV) DNA test positive: Secondary | ICD-10-CM

## 2016-11-20 DIAGNOSIS — R87622 Low grade squamous intraepithelial lesion on cytologic smear of vagina (LGSIL): Secondary | ICD-10-CM | POA: Diagnosis not present

## 2016-11-20 NOTE — Progress Notes (Signed)
   GYNECOLOGY CLINIC COLPOSCOPY PROCEDURE NOTE  61 y.o. No obstetric history on file. here for colposcopy for LSIL HPV positive pap smear on 10/22/2016. Discussed underlying role for HPV infection in the development of cervical dysplasia, its natural history and progression/regression, need for surveillance.  We also discussed that while cervical cancer has an incidence of about 7 cases per 100,000 women a year in the [place], vaginal cancer is relatively rare with an estimated incidence of about 1 case per 100,000 women a year in the [place].   We discussed that pap smear has not been shown to be an effective screening tool for vaginal cancer and results in a large amount of false positive given the relatively low incidence.  ASCCP guidelines recommend cessation of pap smears at age 24 or after hysterectomy if there is no history of CIN II or greater in the past 10 years.  The patient's hysterectomy was conducted >10 years ago and was done for bleeding.     Is the patient  pregnant: No LMP: No LMP recorded. Patient has had a hysterectomy. Smoking status:  Metrics: Intervention Frequency ACO  Documented Smoking Status Yearly  Screened one or more times in 24 months  Cessation Counseling or  Active cessation medication Past 24 months  Past 24 months   Guideline developer: UpToDate (See UpToDate for funding source) Date Released: 2014  Patient given informed consent, signed copy in the chart, time out was performed.  The patient was position in dorsal lithotomy position. Speculum was placed the cervix was visualized.   After application of acetic acid colposcopic inspection of the cervix was undertaken.   Colposcopy adequate, full visualization of transformation zone: no (no cervix)  The vaginal fornix appeared normal other than atrophic findings secondary to menopause; no acetowhite lesions were visualized, biopsies were not obtained ECC specimen obtained:  No  All specimens were labeled and sent to  pathology.   Patient was given post procedure instructions.  Will follow up pathology and manage accordingly.  Routine preventative health maintenance measures emphasized.  My recommendation is to discontinue paps at this time.  OBGyn Exam  Malachy Mood, MD, [facility], [facility]

## 2016-11-25 ENCOUNTER — Other Ambulatory Visit: Payer: Self-pay | Admitting: Family Medicine

## 2016-11-25 NOTE — Telephone Encounter (Signed)
Please ask patient to come by for fasting labs and we can see what her cholesterol is on the medicine dose; we'll adjust it if needed I sent in a refill so she doesn't run out Please have her come by in the next week or two Thank you

## 2016-11-26 NOTE — Telephone Encounter (Signed)
Left detailed voicemial 

## 2017-02-26 ENCOUNTER — Telehealth: Payer: Self-pay | Admitting: Family Medicine

## 2017-02-26 DIAGNOSIS — Z1211 Encounter for screening for malignant neoplasm of colon: Secondary | ICD-10-CM

## 2017-02-26 DIAGNOSIS — Z1231 Encounter for screening mammogram for malignant neoplasm of breast: Secondary | ICD-10-CM

## 2017-02-26 NOTE — Telephone Encounter (Signed)
Called pt informed her of the need for upcoming colonoscopy. Pt states that she has no preference on where she goes, "[facility] is fine" will place referral. Pt states that she is also due for her mammogram, will place order as well. Reminded pt to come in and get fasting blood work done as well. Pt gave verbal understanding.

## 2017-02-26 NOTE — Telephone Encounter (Signed)
Patient is due for 10 year colonoscopy in February Please ask where she'd like to go, and place referral; thank you

## 2017-02-26 NOTE — Telephone Encounter (Signed)
-----   Message from Arnetha Courser, MD sent at 10/22/2016  2:27 PM EDT ----- Regarding: schedule colonoscopy, due Feb 2019 Due for 10 year colonoscopy in Feb 2019

## 2017-03-20 ENCOUNTER — Encounter: Payer: Self-pay | Admitting: Family Medicine

## 2017-03-20 ENCOUNTER — Ambulatory Visit: Payer: BLUE CROSS/BLUE SHIELD | Admitting: Family Medicine

## 2017-03-20 VITALS — BP 126/82 | HR 83 | Temp 98.1°F | Resp 16 | Wt 212.1 lb

## 2017-03-20 DIAGNOSIS — R519 Headache, unspecified: Secondary | ICD-10-CM

## 2017-03-20 DIAGNOSIS — Z720 Tobacco use: Secondary | ICD-10-CM

## 2017-03-20 DIAGNOSIS — R51 Headache: Secondary | ICD-10-CM | POA: Diagnosis not present

## 2017-03-20 DIAGNOSIS — Z23 Encounter for immunization: Secondary | ICD-10-CM

## 2017-03-20 DIAGNOSIS — D751 Secondary polycythemia: Secondary | ICD-10-CM

## 2017-03-20 DIAGNOSIS — R0681 Apnea, not elsewhere classified: Secondary | ICD-10-CM | POA: Diagnosis not present

## 2017-03-20 DIAGNOSIS — R1310 Dysphagia, unspecified: Secondary | ICD-10-CM

## 2017-03-20 DIAGNOSIS — J439 Emphysema, unspecified: Secondary | ICD-10-CM | POA: Diagnosis not present

## 2017-03-20 DIAGNOSIS — R0683 Snoring: Secondary | ICD-10-CM | POA: Diagnosis not present

## 2017-03-20 DIAGNOSIS — F331 Major depressive disorder, recurrent, moderate: Secondary | ICD-10-CM | POA: Diagnosis not present

## 2017-03-20 MED ORDER — OMEPRAZOLE 40 MG PO CPDR: 40.0000 mg | DELAYED_RELEASE_CAPSULE | Freq: Every day | ORAL | 0 refills | Status: DC

## 2017-03-20 MED ORDER — DULOXETINE HCL 30 MG PO CPEP: 90.0000 mg | ORAL_CAPSULE | Freq: Every day | ORAL | 1 refills | Status: DC

## 2017-03-20 MED ORDER — BUTALBITAL-ACETAMINOPHEN 50-325 MG PO TABS: ORAL_TABLET | ORAL | 0 refills | Status: DC

## 2017-03-20 NOTE — Assessment & Plan Note (Signed)
Managed by pulmonologist, Dr. Raul Del; encouraged her to quit smoking; she will consider, see AVS

## 2017-03-20 NOTE — Assessment & Plan Note (Addendum)
Refer to pulm for probable OSA; my staff actually called her present pulmonologist and he does sleep medicine, so she can get back in with him for sleep study

## 2017-03-20 NOTE — Assessment & Plan Note (Signed)
>>  ASSESSMENT AND PLAN FOR EMPHYSEMA/COPD ([facility]) WRITTEN ON 03/20/2017  5:38 PM BY LADA, MELINDA P, MD  Managed by pulmonologist, Dr. Meredeth Ide; encouraged her to quit smoking; she will consider, see AVS

## 2017-03-20 NOTE — Assessment & Plan Note (Signed)
Patient to get back in with Dr. Vicente Males; refill provided of the PPI; avoid NSAIDs

## 2017-03-20 NOTE — Assessment & Plan Note (Signed)
Reviewed prior referral; patient does not recall any appt; we contacted her pulmonologist's office today and he does practice sleep medicine; pt to call and make appt to see Dr. Raul Del, with whom she is already established

## 2017-03-20 NOTE — Progress Notes (Signed)
BP 126/82   Pulse 83   Temp 98.1 F (36.7 C) (Oral)   Resp 16   Wt 212 lb 1.6 oz (96.2 kg)   SpO2 95%   BMI 35.30 kg/m    Subjective:    Patient ID: [name], female    DOB: Oct 04, 1955, 62 y.o.   MRN: [id number]  HPI: Debra Mccann is a 62 y.o. female  Chief Complaint  Patient presents with  . Medication Management    anxiety, depression  . Depression    HPI She is having anxiety and depression; here with her husband Does not want to get out of bed in the mornings; had a migraine too, doesn't want light and noise Not seeing a psychiatrist She says she hasn't had time; trying to help take care of grandchildren No thoughts SI/HI Talked about stressors Taking vitamin D every day; brought it up from 28 to 36 No hypothyroidism No strong fam hx of depression Still taking duloxetine  Changed jobs, husband had two hernia operations; daughter going through stuff with her boyfriend, piling up  She has issues with her right foot; swollen below the medial malleolus; just popped up, not sure if twisted walking; hurts on the bottom of the heel too; bothering her for one week; hurt really bad and then stopped and then started back; the swelling hurts with pressure; heel hurts with walking; knot on the right heel; has not tried anything; no old injuries  Her right wrist / forearm is bothering her; some swelling over the radial/lateral aspect; has been taking ibuprofen for headaches; first thought it as her eyesight, but had glasses checked  Having migraines every week; husband asks if medicine he takes might be something I could prescribe for her (apap-butalbital-caffeine)  She was supposed to have the EGD done with Dr. Vicente Males, but had to cancel the scope; implant got infected so she had to rescheduled; was on omeprazole 40 mg   Coughs and hacks so bad before bed and first thing in the morning  She smokes, 1 ppd, depends on stress; no smoking at work, goes outside twice  a day  Depression screen [facility] 2/9 03/20/2017 10/22/2016 05/23/2016 03/15/2016 02/22/2016  Decreased Interest 3 0 - 0 2  Down, Depressed, Hopeless 3 0 1 0 1  PHQ - 2 Score 6 0 1 0 3  Altered sleeping 0 - - - 0  Tired, decreased energy 3 - - - 1  Change in appetite 3 - - - 0  Feeling bad or failure about yourself  3 - - - 1  Trouble concentrating 1 - - - 1  Moving slowly or fidgety/restless 0 - - - 0  Suicidal thoughts 0 - - - 0  PHQ-9 Score 16 - - - 6  Difficult doing work/chores Very difficult - - - Somewhat difficult    Relevant past medical, surgical, family and social history reviewed Past Medical History:  Diagnosis Date  . Asthma   . Bronchitis   . Cancer ([place])    lung cancer  . Cervical high risk HPV (human papillomavirus) test positive 10/24/2016  . COPD (chronic obstructive pulmonary disease) ([place])   . Depression   . HPV (human papilloma virus) infection 10/22/2016   Evaluated by [facility]  . LGSIL on Pap smear of cervix 10/24/2016   With HPV(+) and mild dysplasia  . Lung cancer ([place]) 2004  . OA (osteoarthritis) of hip    right  . Osteopenia   .  Personal history of chemotherapy   . Urticaria    Past Surgical History:  Procedure Laterality Date  . ABDOMINAL HYSTERECTOMY  1997   partial, only ovaries remain; due to heavy bleeding  . CHOLECYSTECTOMY  1999  . LUNG CANCER SURGERY Right 2004   removed "top portion of right lung"   Family History  Problem Relation Age of Onset  . CAD Mother        with stents  . Heart disease Mother   . Hypertension Mother   . Heart disease Father        3 vessel CABG  . Cancer Father        melanoma  . Hypertension Father   . Gout Father   . Depression Daughter   . Alzheimer's disease Maternal Grandmother   . Diabetes Neg Hx   . Stroke Neg Hx   . COPD Neg Hx   . Breast cancer Neg Hx    Social History   Tobacco Use  . Smoking status: Current Every Day Smoker    Packs/day: 0.50    Years: 23.00    Pack years: 11.50   . Smokeless tobacco: Never Used  Substance Use Topics  . Alcohol use: No    Alcohol/week: 0.0 oz  . Drug use: No   Interim medical history since last visit reviewed. Allergies and medications reviewed  Review of Systems Per HPI unless specifically indicated above     Objective:    BP 126/82   Pulse 83   Temp 98.1 F (36.7 C) (Oral)   Resp 16   Wt 212 lb 1.6 oz (96.2 kg)   SpO2 95%   BMI 35.30 kg/m   Wt Readings from Last 3 Encounters:  03/20/17 212 lb 1.6 oz (96.2 kg)  11/20/16 216 lb (98 kg)  10/22/16 211 lb 14.4 oz (96.1 kg)    Physical Exam  Constitutional: She appears well-developed and well-nourished.  HENT:  Right Ear: Tympanic membrane and ear canal normal.  Left Ear: Tympanic membrane and ear canal normal.  Nose: No rhinorrhea.  Mouth/Throat: Oropharynx is clear and moist and mucous membranes are normal.  Eyes: EOM are normal. No scleral icterus.  Cardiovascular: Normal rate and regular rhythm.  No extrasystoles are present.  Pulmonary/Chest: Effort normal. No respiratory distress. She has wheezes (expiratory single wheeze on the right).  Skin: No pallor.  Psychiatric: Her behavior is normal. Her mood appears anxious. Her affect is not blunt and not inappropriate. Her speech is not rapid and/or pressured, not delayed and not tangential. She is not agitated, not slowed and not withdrawn. She exhibits a depressed mood. She expresses no homicidal and no suicidal ideation.  Good eye contact with examiner; not despondent       Assessment & Plan:   Problem List Items Addressed This Visit      Respiratory   Emphysema/COPD ([place]) (Chronic)    Managed by pulmonologist, Dr. Raul Del; encouraged her to quit smoking; she will consider, see AVS        Other   Tobacco abuse (Chronic)    Encouraged patient to quit smoking; she'll consider Chantix in the future, but we discussed not starting it now with her depression; she sounds like she may be able to quit cold  [place]; see AVS; 3+ minutes spent on discussing smoking cessation      Snoring    Refer to pulm for probable OSA; my staff actually called her present pulmonologist and he does sleep medicine, so she can  get back in with him for sleep study      Major depressive disorder, recurrent episode, moderate with anxious distress ([place]) - Primary    Time spent with patient and her husband discussed role of stress on the body, self-care, medications, risk of stroke with anxiety and high BP, modifying stressors in her life; she declined offer to see psychiatrist and psychologist today; will increase her cymbalta to 90 mg daily; relaxation response explained, see AVS; close f/u in two weeks      Relevant Medications   DULoxetine (CYMBALTA) 30 MG capsule   Food sticks on swallowing    Patient to get back in with Dr. Vicente Males; refill provided of the PPI; avoid NSAIDs      Erythrocytosis    Reviewed prior referral; patient does not recall any appt; we contacted her pulmonologist's office today and he does practice sleep medicine; pt to call and make appt to see Dr. Raul Del, with whom she is already established       Other Visit Diagnoses    Need for Tdap vaccination       Relevant Orders   Tdap vaccine greater than or equal to 7yo IM (Completed)   Witnessed episode of apnea       patient would benefit from sleep study to see if OSA present; she can see her current pulm; may be causing fatigue, decreased ability to deal with stress, HA   Increased frequency of headaches       likely multifactorial (may be OSA, stress, dehydration, etc.); will use APAP with butalbital, no caffeine; stress reduction, magnesium supplementation   Relevant Medications   aspirin EC 81 MG tablet   ibuprofen (ADVIL,MOTRIN) 200 MG tablet   DULoxetine (CYMBALTA) 30 MG capsule   ACETAMINOPHEN-BUTALBITAL 50-325 MG TABS       Follow up plan: Return in about 2 weeks (around 04/03/2017).  An after-visit summary was printed and  given to the patient at [facility].  Please see the patient instructions which may contain other information and recommendations beyond what is mentioned above in the assessment and plan.  Meds ordered this encounter  Medications  . omeprazole (PRILOSEC) 40 MG capsule    Sig: Take 1 capsule (40 mg total) by mouth daily.    Dispense:  30 capsule    Refill:  0  . DULoxetine (CYMBALTA) 30 MG capsule    Sig: Take 3 capsules (90 mg total) by mouth daily.    Dispense:  90 capsule    Refill:  1  . ACETAMINOPHEN-BUTALBITAL 50-325 MG TABS    Sig: One by mouth every six hours if needed for headaches    Dispense:  30 each    Refill:  0    Orders Placed This Encounter  Procedures  . Tdap vaccine greater than or equal to 7yo IM   Face-to-face time with patient was more than 40 minutes, >50% time spent counseling and coordination of care

## 2017-03-20 NOTE — Assessment & Plan Note (Signed)
Time spent with patient and her husband discussed role of stress on the body, self-care, medications, risk of stroke with anxiety and high BP, modifying stressors in her life; she declined offer to see psychiatrist and psychologist today; will increase her cymbalta to 90 mg daily; relaxation response explained, see AVS; close f/u in two weeks

## 2017-03-20 NOTE — Assessment & Plan Note (Addendum)
Encouraged patient to quit smoking; she'll consider Chantix in the future, but we discussed not starting it now with her depression; she sounds like she may be able to quit cold [place]; see AVS; 3+ minutes spent on discussing smoking cessation

## 2017-03-20 NOTE — Patient Instructions (Addendum)
Think about the stressors in your life, what you do and don't have control over and consider changes Magnesium oxide 250 or 400 mg daily may reduce your migraine frequency Increase your Cymbalta from 60 mg daily to 90 mg daily Please do see Dr. Raul Del about your snoring, and get a sleep study Try the new medicine if needed for headaches  I do encourage you to quit smoking Call [phone] to sign up for smoking cessation classes You can call 1-800-QUIT-NOW to talk with a smoking cessation coach  Try to follow the DASH guidelines (DASH stands for Dietary Approaches to Stop Hypertension). Try to limit the sodium in your diet to no more than 1,500mg  of sodium per day. Certainly try to not exceed 2,000 mg per day at the very most. Do not add salt when cooking or at the table.  Check the sodium amount on labels when shopping, and choose items lower in sodium when given a choice. Avoid or limit foods that already contain a lot of sodium. Eat a diet rich in fruits and vegetables and whole grains, and try to lose weight if overweight or obese   Steps to Elicit the Relaxation Response The following is the technique reprinted with permission from Dr. Billie Ruddy book The Relaxation Response pages 162-163 1. Sit quietly in a comfortable position. 2. Close your eyes. 3. Deeply relax all your muscles,  beginning at your feet and progressing up to your face.  Keep them relaxed. 4. Breathe through your nose.  Become aware of your breathing.  As you breathe out, say the word, "one"*,  silently to yourself. For example,  breathe in ... out, "one",- in .. out, "one", etc.  Breathe easily and naturally. 5. Continue for 10 to 20 minutes.  You may open your eyes to check the time, but do not use an alarm.  When you finish, sit quietly for several minutes,  at first with your eyes closed and later with your eyes opened.  Do not stand up for a few minutes. 6. Do not worry about whether you are  successful  in achieving a deep level of relaxation.  Maintain a passive attitude and permit relaxation to occur at its own pace.  When distracting thoughts occur,  try to ignore them by not dwelling upon them  and return to repeating "one."  With practice, the response should come with little effort.  Practice the technique once or twice daily,  but not within two hours after any meal,  since the digestive processes seem to interfere with  the elicitation of the Relaxation Response. * It is better to use a soothing, mellifluous sound, preferably with no meaning. or association, to avoid stimulation of unnecessary thoughts - a mantra.     12 Ways to Curb Anxiety  ?Anxiety is normal human sensation. It is what helped our ancestors survive the pitfalls of the wilderness. Anxiety is defined as experiencing worry or nervousness about an imminent event or something with an uncertain outcome. It is a feeling experienced by most people at some point in their lives. Anxiety can be triggered by a very personal issue, such as the illness of a loved one, or an event of global proportions, such as a refugee crisis. Some of the symptoms of anxiety are:  Feeling restless.  Having a feeling of impending danger.  Increased heart rate.  Rapid breathing. Sweating.  Shaking.  Weakness or feeling tired.  Difficulty concentrating on anything except the current worry.  Insomnia.  Stomach or bowel problems. What can we do about anxiety we may be feeling? There are many techniques to help manage stress and relax. Here are 12 ways you can reduce your anxiety almost immediately: 1. Turn off the constant feed of information. Take a social media sabbatical. Studies have shown that social media directly contributes to social anxiety.  2. Monitor your television viewing habits. Are you watching shows that are also contributing to your anxiety, such as 24-hour news stations? Try watching something else, or better  yet, nothing at all. Instead, listen to music, read an inspirational book or practice a hobby. 3. Eat nutritious meals. Also, don't skip meals and keep healthful snacks on hand. Hunger and poor diet contributes to feeling anxious. 4. Sleep. Sleeping on a regular schedule for at least seven to eight hours a night will do wonders for your outlook when you are awake. 5. Exercise. Regular exercise will help rid your body of that anxious energy and help you get more restful sleep. 6. Try deep (diaphragmatic) breathing. Inhale slowly through your nose for five seconds and exhale through your mouth. 7. Practice acceptance and gratitude. When anxiety hits, accept that there are things out of your control that shouldn't be of immediate concern.  8. Seek out humor. When anxiety strikes, watch a funny video, read jokes or call a friend who makes you laugh. Laughter is healing for our bodies and releases endorphins that are calming. 9. Stay positive. Take the effort to replace negative thoughts with positive ones. Try to see a stressful situation in a positive light. Try to come up with solutions rather than dwelling on the problem. 10. Figure out what triggers your anxiety. Keep a journal and make note of anxious moments and the events surrounding them. This will help you identify triggers you can avoid or even eliminate. 11. Talk to someone. Let a trusted friend, family member or even trained professional know that you are feeling overwhelmed and anxious. Verbalize what you are feeling and why.  12. Volunteer. If your anxiety is triggered by a crisis on a large scale, become an advocate and work to resolve the problem that is causing you unease. Anxiety is often unwelcome and can become overwhelming. If not kept in check, it can become a disorder that could require medical treatment. However, if you take the time to care for yourself and avoid the triggers that make you anxious, you will be able to find moments of  relaxation and clarity that make your life much more enjoyable.   Steps to Quit Smoking Smoking tobacco can be bad for your health. It can also affect almost every organ in your body. Smoking puts you and people around you at risk for many serious long-lasting (chronic) diseases. Quitting smoking is hard, but it is one of the best things that you can do for your health. It is never too late to quit. What are the benefits of quitting smoking? When you quit smoking, you lower your risk for getting serious diseases and conditions. They can include:  Lung cancer or lung disease.  Heart disease.  Stroke.  Heart attack.  Not being able to have children (infertility).  Weak bones (osteoporosis) and broken bones (fractures).  If you have coughing, wheezing, and shortness of breath, those symptoms may get better when you quit. You may also get sick less often. If you are pregnant, quitting smoking can help to lower your chances of having a baby of low birth weight. What can  I do to help me quit smoking? Talk with your doctor about what can help you quit smoking. Some things you can do (strategies) include:  Quitting smoking totally, instead of slowly cutting back how much you smoke over a period of time.  Going to in-person counseling. You are more likely to quit if you go to many counseling sessions.  Using resources and support systems, such as: ? Database administrator with a Social worker. ? Phone quitlines. ? Careers information officer. ? Support groups or group counseling. ? Text messaging programs. ? Mobile phone apps or applications.  Taking medicines. Some of these medicines may have nicotine in them. If you are pregnant or breastfeeding, do not take any medicines to quit smoking unless your doctor says it is okay. Talk with your doctor about counseling or other things that can help you.  Talk with your doctor about using more than one strategy at the same time, such as taking medicines  while you are also going to in-person counseling. This can help make quitting easier. What things can I do to make it easier to quit? Quitting smoking might feel very hard at first, but there is a lot that you can do to make it easier. Take these steps:  Talk to your family and friends. Ask them to support and encourage you.  Call phone quitlines, reach out to support groups, or work with a Social worker.  Ask people who smoke to not smoke around you.  Avoid places that make you want (trigger) to smoke, such as: ? Bars. ? Parties. ? Smoke-break areas at work.  Spend time with people who do not smoke.  Lower the stress in your life. Stress can make you want to smoke. Try these things to help your stress: ? Getting regular exercise. ? Deep-breathing exercises. ? Yoga. ? Meditating. ? Doing a body scan. To do this, close your eyes, focus on one area of your body at a time from head to toe, and notice which parts of your body are tense. Try to relax the muscles in those areas.  Download or buy apps on your mobile phone or tablet that can help you stick to your quit plan. There are many free apps, such as QuitGuide from the [employer] Office manager for Disease Control and Prevention). You can find more support from smokefree.gov and other websites.  This information is not intended to replace advice given to you by your health care provider. Make sure you discuss any questions you have with your health care provider. Document Released: 12/08/2008 Document Revised: 10/10/2015 Document Reviewed: 06/28/2014 Elsevier Interactive Patient Education  2018 [employer].

## 2017-03-21 ENCOUNTER — Telehealth: Payer: Self-pay | Admitting: Family Medicine

## 2017-03-21 NOTE — Telephone Encounter (Signed)
Dr. Sanda Klein spoke with pharmacist

## 2017-03-21 NOTE — Telephone Encounter (Signed)
[facility] pharmacy called in to receive clarification on ACETAMINOPHEN-BUTALBITAL 50-325 MG TABS     Please advise.    CB: [phone]

## 2017-03-24 ENCOUNTER — Encounter: Payer: Self-pay | Admitting: Family Medicine

## 2017-03-24 ENCOUNTER — Other Ambulatory Visit: Payer: Self-pay

## 2017-03-24 ENCOUNTER — Telehealth: Payer: Self-pay

## 2017-03-24 DIAGNOSIS — M2550 Pain in unspecified joint: Secondary | ICD-10-CM

## 2017-03-24 DIAGNOSIS — Z1211 Encounter for screening for malignant neoplasm of colon: Secondary | ICD-10-CM

## 2017-03-24 NOTE — Telephone Encounter (Signed)
Gastroenterology Pre-Procedure Review  Request Date: 05/19/17 Requesting Physician: Dr. Vicente Males  PATIENT REVIEW QUESTIONS: The patient responded to the following health history questions as indicated:    1. Are you having any GI issues? no 2. Do you have a personal history of Polyps? no 3. Do you have a family history of Colon Cancer or Polyps? no 4. Diabetes Mellitus? no 5. Joint replacements in the past 12 months?no 6. Major health problems in the past 3 months?no 7. Any artificial heart valves, MVP, or defibrillator?no    MEDICATIONS & ALLERGIES:    Patient reports the following regarding taking any anticoagulation/antiplatelet therapy:   Plavix, Coumadin, Eliquis, Xarelto, Lovenox, Pradaxa, Brilinta, or Effient? no Aspirin? yes (81 mg baby aspirin)  Patient confirms/reports the following medications:  Current Outpatient Medications  Medication Sig Dispense Refill  . ACETAMINOPHEN-BUTALBITAL 50-325 MG TABS One by mouth every six hours if needed for headaches 30 each 0  . albuterol (PROAIR HFA) 108 (90 Base) MCG/ACT inhaler Inhale into the lungs.    Marland Kitchen aspirin EC 81 MG tablet Take 81 mg by mouth daily.    . cetirizine (ZYRTEC) 10 MG tablet Take 10 mg by mouth daily.    . Cholecalciferol (VITAMIN D-1000 MAX ST) 1000 units tablet Take by mouth.    . docusate sodium (COLACE) 100 MG capsule Take 100 mg by mouth daily.    . DULoxetine (CYMBALTA) 30 MG capsule Take 3 capsules (90 mg total) by mouth daily. 90 capsule 1  . fluticasone (FLONASE) 50 MCG/ACT nasal spray Place into the nose.    Marland Kitchen Fluticasone-Salmeterol (ADVAIR DISKUS) 500-50 MCG/DOSE AEPB Inhale 1 puff into the lungs 2 (two) times daily. 60 each 5  . ibuprofen (ADVIL,MOTRIN) 200 MG tablet Take 800 mg by mouth daily.    . Multiple Vitamin (MULTIVITAMIN WITH MINERALS) TABS tablet Take 1 tablet by mouth daily.    Marland Kitchen omeprazole (PRILOSEC) 40 MG capsule Take 1 capsule (40 mg total) by mouth daily. 30 capsule 0  . pravastatin  (PRAVACHOL) 20 MG tablet TAKE 1 TABLET(20 MG) BY MOUTH AT BEDTIME 30 tablet 0  . vitamin B-12 (CYANOCOBALAMIN) 100 MCG tablet Take 100 mcg by mouth daily.     No current facility-administered medications for this visit.     Patient confirms/reports the following allergies:  Allergies  Allergen Reactions  . Penicillins     No orders of the defined types were placed in this encounter.   AUTHORIZATION INFORMATION Primary Insurance: 1D#: Group #:  Secondary Insurance: 1D#: Group #:  SCHEDULE INFORMATION: Date: 05/19/17 Time: Location:[facility]

## 2017-03-26 ENCOUNTER — Other Ambulatory Visit: Payer: Self-pay

## 2017-03-26 DIAGNOSIS — E785 Hyperlipidemia, unspecified: Secondary | ICD-10-CM

## 2017-03-27 ENCOUNTER — Encounter: Payer: Self-pay | Admitting: Family Medicine

## 2017-03-27 ENCOUNTER — Other Ambulatory Visit: Payer: Self-pay | Admitting: Family Medicine

## 2017-03-27 DIAGNOSIS — E785 Hyperlipidemia, unspecified: Secondary | ICD-10-CM

## 2017-03-27 LAB — LIPID PANEL
Cholesterol: 224 mg/dL — ABNORMAL HIGH (ref <200)
HDL: 50 mg/dL — AB (ref >50)
LDL Cholesterol (Calc): 134 mg/dL (calc) — ABNORMAL HIGH
Non-HDL Cholesterol (Calc): 174 mg/dL (calc) — ABNORMAL HIGH (ref <130)
Total CHOL/HDL Ratio: 4.5 (calc) (ref <5.0)
Triglycerides: 257 mg/dL — ABNORMAL HIGH (ref <150)

## 2017-03-27 LAB — BASIC METABOLIC PANEL
BUN: 9 mg/dL (ref 7–25)
CO2: 31 mmol/L (ref 20–32)
Calcium: 9.5 mg/dL (ref 8.6–10.4)
Chloride: 107 mmol/L (ref 98–110)
Creat: 0.9 mg/dL (ref 0.50–0.99)
GLUCOSE: 79 mg/dL (ref 65–139)
Potassium: 4.4 mmol/L (ref 3.5–5.3)
SODIUM: 142 mmol/L (ref 135–146)

## 2017-03-27 LAB — ANA,IFA RA DIAG PNL W/RFLX TIT/PATN: ANA: NEGATIVE

## 2017-03-27 LAB — C-REACTIVE PROTEIN: CRP: 3.2 mg/L (ref <8.0)

## 2017-03-27 LAB — URIC ACID: URIC ACID, SERUM: 4.6 mg/dL (ref 2.5–7.0)

## 2017-03-27 MED ORDER — ATORVASTATIN CALCIUM 20 MG PO TABS: 20.0000 mg | ORAL_TABLET | Freq: Every day | ORAL | 1 refills | Status: DC

## 2017-03-27 NOTE — Progress Notes (Signed)
Stop pravastatin; start atorvastatin; recheck lipids in 8 weeks

## 2017-04-02 ENCOUNTER — Ambulatory Visit: Payer: BLUE CROSS/BLUE SHIELD | Admitting: Family Medicine

## 2017-04-12 ENCOUNTER — Other Ambulatory Visit: Payer: Self-pay | Admitting: Family Medicine

## 2017-04-12 NOTE — Telephone Encounter (Signed)
PPI request too soon

## 2017-04-13 ENCOUNTER — Encounter: Payer: Self-pay | Admitting: Family Medicine

## 2017-05-13 ENCOUNTER — Other Ambulatory Visit: Payer: Self-pay | Admitting: Family Medicine

## 2017-05-13 NOTE — Telephone Encounter (Signed)
Patient was supposed to have been seen after last visit She no showed on 04/02/17 Please ask her to schedule a visit Thank you

## 2017-05-14 NOTE — Telephone Encounter (Signed)
Called [phone] @ 8:18 informing that pt need to sch appt and that her script has been sent to pharmacy

## 2017-05-19 ENCOUNTER — Encounter: Payer: Self-pay | Admitting: Certified Registered Nurse Anesthetist

## 2017-05-19 ENCOUNTER — Ambulatory Visit: Payer: BLUE CROSS/BLUE SHIELD | Admitting: Anesthesiology

## 2017-05-19 ENCOUNTER — Ambulatory Visit
Admission: RE | Admit: 2017-05-19 | Discharge: 2017-05-19 | Disposition: A | Discharge status: Home / Self Care | Payer: BLUE CROSS/BLUE SHIELD | Source: Ambulatory Visit | Attending: Gastroenterology | Admitting: Gastroenterology

## 2017-05-19 ENCOUNTER — Encounter
Admission: RE | Disposition: A | Discharge status: Home / Self Care | Payer: Self-pay | Source: Ambulatory Visit | Attending: Gastroenterology

## 2017-05-19 DIAGNOSIS — F17210 Nicotine dependence, cigarettes, uncomplicated: Secondary | ICD-10-CM | POA: Insufficient documentation

## 2017-05-19 DIAGNOSIS — J449 Chronic obstructive pulmonary disease, unspecified: Secondary | ICD-10-CM | POA: Insufficient documentation

## 2017-05-19 DIAGNOSIS — Z79899 Other long term (current) drug therapy: Secondary | ICD-10-CM | POA: Insufficient documentation

## 2017-05-19 DIAGNOSIS — F329 Major depressive disorder, single episode, unspecified: Secondary | ICD-10-CM | POA: Insufficient documentation

## 2017-05-19 DIAGNOSIS — M8580 Other specified disorders of bone density and structure, unspecified site: Secondary | ICD-10-CM | POA: Insufficient documentation

## 2017-05-19 DIAGNOSIS — Z1211 Encounter for screening for malignant neoplasm of colon: Secondary | ICD-10-CM | POA: Diagnosis present

## 2017-05-19 DIAGNOSIS — K5730 Diverticulosis of large intestine without perforation or abscess without bleeding: Secondary | ICD-10-CM | POA: Diagnosis not present

## 2017-05-19 DIAGNOSIS — Z7982 Long term (current) use of aspirin: Secondary | ICD-10-CM | POA: Diagnosis not present

## 2017-05-19 DIAGNOSIS — Z85118 Personal history of other malignant neoplasm of bronchus and lung: Secondary | ICD-10-CM | POA: Diagnosis not present

## 2017-05-19 HISTORY — PX: COLONOSCOPY WITH PROPOFOL: SHX5780

## 2017-05-19 SURGERY — COLONOSCOPY WITH PROPOFOL
Anesthesia: General

## 2017-05-19 MED ORDER — SODIUM CHLORIDE 0.9 % IV SOLN
INTRAVENOUS | Status: DC
  Administered 2017-05-19: 1000 mL via INTRAVENOUS

## 2017-05-19 MED ORDER — LIDOCAINE HCL (CARDIAC) 20 MG/ML IV SOLN
INTRAVENOUS | Status: DC | PRN
  Administered 2017-05-19: 50 mg via INTRAVENOUS

## 2017-05-19 MED ORDER — PROPOFOL 10 MG/ML IV BOLUS
INTRAVENOUS | Status: AC
  Filled 2017-05-19: qty 20

## 2017-05-19 MED ORDER — PROPOFOL 10 MG/ML IV BOLUS
INTRAVENOUS | Status: DC | PRN
  Administered 2017-05-19 (×2): 40 mg via INTRAVENOUS
  Administered 2017-05-19: 70 mg via INTRAVENOUS
  Administered 2017-05-19: 30 mg via INTRAVENOUS
  Administered 2017-05-19: 40 mg via INTRAVENOUS
  Administered 2017-05-19: 20 mg via INTRAVENOUS
  Administered 2017-05-19: 30 mg via INTRAVENOUS

## 2017-05-19 MED ORDER — PROPOFOL 10 MG/ML IV BOLUS
INTRAVENOUS | Status: AC
Start: 2017-05-19 — End: 2017-05-19
  Filled 2017-05-19: qty 20

## 2017-05-19 NOTE — Transfer of Care (Signed)
Immediate Anesthesia Transfer of Care Note  Patient: Debra Mccann  Procedure(s) Performed: COLONOSCOPY WITH PROPOFOL (N/A )  Patient Location: PACU and Endoscopy Unit  Anesthesia Type:General  Level of Consciousness: awake  Airway & Oxygen Therapy: Patient Spontanous Breathing  Post-op Assessment: Report given to RN  Post vital signs: stable  Last Vitals:  Vitals Value Taken Time  BP    Temp    Pulse    Resp    SpO2      Last Pain:  Vitals:   05/19/17 0815  TempSrc: Tympanic         Complications: No apparent anesthesia complications

## 2017-05-19 NOTE — Anesthesia Post-op Follow-up Note (Signed)
Anesthesia QCDR form completed.        

## 2017-05-19 NOTE — Anesthesia Postprocedure Evaluation (Signed)
Anesthesia Post Note  Patient: Debra Mccann  Procedure(s) Performed: COLONOSCOPY WITH PROPOFOL (N/A )  Patient location during evaluation: Endoscopy Anesthesia Type: General Level of consciousness: awake and alert Pain management: pain level controlled Vital Signs Assessment: post-procedure vital signs reviewed and stable Respiratory status: spontaneous breathing, nonlabored ventilation and respiratory function stable Cardiovascular status: blood pressure returned to baseline and stable Postop Assessment: no apparent nausea or vomiting Anesthetic complications: no     Last Vitals:  Vitals:   05/19/17 0925 05/19/17 0935  BP: 133/71 131/67  Pulse: 81 77  Resp: 17 12  Temp:    SpO2: 99% 98%    Last Pain:  Vitals:   05/19/17 0905  TempSrc: Tympanic                 [address] Harvie Heck

## 2017-05-19 NOTE — H&P (Signed)
Jonathon Bellows, MD [address], [place], [place], [place], [address] [address], [place], [place], [place], [address] Phone: [phone]  Fax: [phone]  Primary Care Physician:  Arnetha Courser, MD   Pre-Procedure History & Physical: HPI:  Debra Mccann is a 62 y.o. female is here for an colonoscopy.   Past Medical History:  Diagnosis Date  . Asthma   . Bronchitis   . Cancer ([place])    lung cancer  . Cervical high risk HPV (human papillomavirus) test positive 10/24/2016  . COPD (chronic obstructive pulmonary disease) ([place])   . Depression   . HPV (human papilloma virus) infection 10/22/2016   Evaluated by [place]  . LGSIL on Pap smear of cervix 10/24/2016   With HPV(+) and mild dysplasia  . Lung cancer ([place]) 2004  . OA (osteoarthritis) of hip    right  . Osteopenia   . Personal history of chemotherapy   . Urticaria     Past Surgical History:  Procedure Laterality Date  . ABDOMINAL HYSTERECTOMY  1997   partial, only ovaries remain; due to heavy bleeding  . CHOLECYSTECTOMY  1999  . LUNG CANCER SURGERY Right 2004   removed "top portion of right lung"    Prior to Admission medications   Medication Sig Start Date End Date Taking? Authorizing Provider  ACETAMINOPHEN-BUTALBITAL 50-325 MG TABS One by mouth every six hours if needed for headaches 03/20/17   Lada, Satira Anis, MD  albuterol (PROAIR HFA) 108 (90 Base) MCG/ACT inhaler Inhale into the lungs. 06/17/15   [provider]  aspirin EC 81 MG tablet Take 81 mg by mouth daily.    [provider]  atorvastatin (LIPITOR) 20 MG tablet Take 1 tablet (20 mg total) by mouth at bedtime. 03/27/17   Arnetha Courser, MD  cetirizine (ZYRTEC) 10 MG tablet Take 10 mg by mouth daily.    [provider]  Cholecalciferol (VITAMIN D-1000 MAX ST) 1000 units tablet Take by mouth.    [provider]  docusate sodium (COLACE) 100 MG capsule Take 100 mg by mouth daily.    [provider]  DULoxetine (CYMBALTA) 30 MG capsule TAKE 3 CAPSULES(90 MG) BY MOUTH DAILY 05/13/17   Lada, Satira Anis, MD  fluticasone (FLONASE) 50 MCG/ACT nasal spray Place into the nose. 02/08/15 05/23/16  [provider]  Fluticasone-Salmeterol (ADVAIR DISKUS) 500-50 MCG/DOSE AEPB Inhale 1 puff into the lungs 2 (two) times daily. 11/23/14   Arnetha Courser, MD  ibuprofen (ADVIL,MOTRIN) 200 MG tablet Take 800 mg by mouth daily.    [provider]  Multiple Vitamin (MULTIVITAMIN WITH MINERALS) TABS tablet Take 1 tablet by mouth daily.    [provider]  omeprazole (PRILOSEC) 40 MG capsule Take 1 capsule (40 mg total) by mouth daily. 03/20/17 04/19/17  Arnetha Courser, MD  vitamin B-12 (CYANOCOBALAMIN) 100 MCG tablet Take 100 mcg by mouth daily.    [provider]    Allergies as of 03/24/2017 - Review Complete 03/20/2017  Allergen Reaction Noted  . Penicillins  11/16/2014    Family History  Problem Relation Age of Onset  . CAD Mother        with stents  . Heart disease Mother   . Hypertension Mother   . Heart disease Father        3 vessel CABG  . Cancer Father        melanoma  . Hypertension Father   .  Gout Father   . Depression Daughter   . Alzheimer's disease Maternal Grandmother   . Diabetes Neg Hx   . Stroke Neg Hx   . COPD Neg Hx   . Breast cancer Neg Hx     Social History   Socioeconomic History  . Marital status: Married    Spouse name: Not on file  . Number of children: Not on file  . Years of education: Not on file  . Highest education level: Not on file  Occupational History  . Not on file  Social Needs  . Financial resource strain: Not on file  . Food insecurity:    Worry: Not on file    Inability: Not on file  . Transportation needs:    Medical: Not on file    Non-medical: Not on file  Tobacco Use  . Smoking status: Current Every Day Smoker    Packs/day: 0.50    Years: 23.00    Pack years: 11.50  . Smokeless  tobacco: Never Used  Substance and Sexual Activity  . Alcohol use: No    Alcohol/week: 0.0 oz  . Drug use: No  . Sexual activity: Yes  Lifestyle  . Physical activity:    Days per week: Not on file    Minutes per session: Not on file  . Stress: Not on file  Relationships  . Social connections:    Talks on phone: Not on file    Gets together: Not on file    Attends religious service: Not on file    Active member of club or organization: Not on file    Attends meetings of clubs or organizations: Not on file    Relationship status: Not on file  . Intimate partner violence:    Fear of current or ex partner: Not on file    Emotionally abused: Not on file    Physically abused: Not on file    Forced sexual activity: Not on file  Other Topics Concern  . Not on file  Social History Narrative  . Not on file    Review of Systems: See HPI, otherwise negative ROS  Physical Exam: There were no vitals taken for this visit. General:   Alert,  pleasant and cooperative in NAD Head:  Normocephalic and atraumatic. Neck:  Supple; no masses or thyromegaly. Lungs:  Clear throughout to auscultation, normal respiratory effort.    Heart:  +S1, +S2, Regular rate and rhythm, No edema. Abdomen:  Soft, nontender and nondistended. Normal bowel sounds, without guarding, and without rebound.   Neurologic:  Alert and  oriented x4;  grossly normal neurologically.  Impression/Plan: Debra Mccann is here for an colonoscopy to be performed for Screening colonoscopy average risk   Risks, benefits, limitations, and alternatives regarding  colonoscopy have been reviewed with the patient.  Questions have been answered.  All parties agreeable.   Jonathon Bellows, MD  05/19/2017, 8:08 AM

## 2017-05-19 NOTE — Op Note (Signed)
[facility] Patient Name: Debra Mccann Procedure Date: 05/19/2017 8:38 AM MRN: [id number] Account #: [id number] Date of Birth: 02/02/1956 Admit Type: Outpatient Age: 62 Room: [facility] ENDO ROOM 4 Gender: Female Note Status: Finalized Procedure:            Colonoscopy Indications:          Screening for colorectal malignant neoplasm Providers:            Jonathon Bellows MD, MD Referring MD:         Arnetha Courser (Referring MD) Medicines:            Monitored Anesthesia Care Complications:        No immediate complications. Procedure:            Pre-Anesthesia Assessment:                       - Prior to the procedure, a History and Physical was                        performed, and patient medications, allergies and                        sensitivities were reviewed. The patient's tolerance of                        previous anesthesia was reviewed.                       - The risks and benefits of the procedure and the                        sedation options and risks were discussed with the                        patient. All questions were answered and informed                        consent was obtained.                       - ASA Grade Assessment: II - A patient with mild                        systemic disease.                       After obtaining informed consent, the colonoscope was                        passed under direct vision. Throughout the procedure,                        the patient's blood pressure, pulse, and oxygen                        saturations were monitored continuously. The                        Colonoscope was introduced through the anus and  advanced to the the cecum, identified by the                        appendiceal orifice, IC valve and transillumination.                        The colonoscopy was performed with ease. The patient                        tolerated the procedure well. The  quality of the bowel                        preparation was good. Findings:      The perianal and digital rectal examinations were normal.      Multiple small-mouthed diverticula were found in the entire colon.      The exam was otherwise without abnormality on direct and retroflexion       views. Impression:           - Diverticulosis in the entire examined colon.                       - The examination was otherwise normal on direct and                        retroflexion views.                       - No specimens collected. Recommendation:       - Discharge patient to home (with escort).                       - Resume previous diet.                       - Continue present medications.                       - Repeat colonoscopy in 10 years for screening purposes. Procedure Code(s):    --- Professional ---                       [phone], Colonoscopy, flexible; diagnostic, including                        collection of specimen(s) by brushing or washing, when                        performed (separate procedure) Diagnosis Code(s):    --- Professional ---                       Z12.11, Encounter for screening for malignant neoplasm                        of colon                       K57.30, Diverticulosis of large intestine without                        perforation or abscess without bleeding CPT copyright 2016 American Medical Association. All rights reserved. The codes documented in this report are  preliminary and upon coder review may  be revised to meet current compliance requirements. Jonathon Bellows, MD Jonathon Bellows MD, MD 05/19/2017 9:04:04 AM This report has been signed electronically. Number of Addenda: 0 Note Initiated On: 05/19/2017 8:38 AM Scope Withdrawal Time: 0 hours 13 minutes 51 seconds  Total Procedure Duration: 0 hours 19 minutes 11 seconds       [facility]

## 2017-05-19 NOTE — Anesthesia Preprocedure Evaluation (Signed)
Anesthesia Evaluation  Patient identified by MRN, date of birth, ID band Patient awake    Reviewed: Allergy & Precautions, H&P , NPO status , reviewed documented beta blocker date and time   Airway Mallampati: II  TM Distance: >3 FB     Dental  (+) Chipped, Caps   Pulmonary asthma , COPD, Current Smoker,    Pulmonary exam normal        Cardiovascular Normal cardiovascular exam     Neuro/Psych PSYCHIATRIC DISORDERS Anxiety Depression    GI/Hepatic Neg liver ROS, GERD  Controlled,  Endo/Other    Renal/GU negative Renal ROS     Musculoskeletal  (+) Arthritis , Osteoarthritis,    Abdominal   Peds  Hematology   Anesthesia Other Findings   Reproductive/Obstetrics                             Anesthesia Physical Anesthesia Plan  ASA: III  Anesthesia Plan: General   Post-op Pain Management:    Induction:   PONV Risk Score and Plan: 2 and Propofol infusion and TIVA  Airway Management Planned:   Additional Equipment:   Intra-op Plan:   Post-operative Plan:   Informed Consent: I have reviewed the patients History and Physical, chart, labs and discussed the procedure including the risks, benefits and alternatives for the proposed anesthesia with the patient or authorized representative who has indicated his/her understanding and acceptance.   Dental Advisory Given  Plan Discussed with: CRNA  Anesthesia Plan Comments:         Anesthesia Quick Evaluation

## 2017-05-20 ENCOUNTER — Encounter: Payer: Self-pay | Admitting: Gastroenterology

## 2017-05-23 ENCOUNTER — Other Ambulatory Visit: Payer: Self-pay | Admitting: Family Medicine

## 2017-05-23 MED ORDER — ATORVASTATIN CALCIUM 20 MG PO TABS: 20.0000 mg | ORAL_TABLET | Freq: Every day | ORAL | 0 refills | Status: DC

## 2017-05-23 NOTE — Telephone Encounter (Signed)
Please see lipid result note Patient needed labs done 8 weeks after start of medicine Please have her come get that done Fasting would be ideal since her TG were high Thank you

## 2017-05-23 NOTE — Telephone Encounter (Signed)
Pt.notified

## 2017-07-12 ENCOUNTER — Other Ambulatory Visit: Payer: Self-pay | Admitting: Family Medicine

## 2017-07-14 NOTE — Telephone Encounter (Signed)
Pt notified appt scheduled for wed

## 2017-07-14 NOTE — Telephone Encounter (Signed)
See previous notes; one about needing to have labs done, and another note about needing an appointment

## 2017-07-16 ENCOUNTER — Ambulatory Visit: Payer: BLUE CROSS/BLUE SHIELD | Admitting: Family Medicine

## 2017-07-16 ENCOUNTER — Encounter: Payer: Self-pay | Admitting: Family Medicine

## 2017-07-16 VITALS — BP 136/82 | HR 95 | Temp 98.2°F | Resp 14 | Ht 65.0 in | Wt 213.2 lb

## 2017-07-16 DIAGNOSIS — F331 Major depressive disorder, recurrent, moderate: Secondary | ICD-10-CM | POA: Diagnosis not present

## 2017-07-16 DIAGNOSIS — E785 Hyperlipidemia, unspecified: Secondary | ICD-10-CM | POA: Diagnosis not present

## 2017-07-16 DIAGNOSIS — R600 Localized edema: Secondary | ICD-10-CM

## 2017-07-16 DIAGNOSIS — E559 Vitamin D deficiency, unspecified: Secondary | ICD-10-CM

## 2017-07-16 DIAGNOSIS — R5383 Other fatigue: Secondary | ICD-10-CM | POA: Diagnosis not present

## 2017-07-16 DIAGNOSIS — R718 Other abnormality of red blood cells: Secondary | ICD-10-CM | POA: Diagnosis not present

## 2017-07-16 MED ORDER — DULOXETINE HCL 30 MG PO CPEP: ORAL_CAPSULE | ORAL | 0 refills | Status: DC

## 2017-07-16 NOTE — Patient Instructions (Signed)
Try compression stockings Try 18 mmHg  You can use 20-30 mmHg if the 18s aren't effective Avoid excess salt We'll have you see Dr. Nicolasa Ducking Let's get labs today If you have not heard anything from my staff in a week about any orders/referrals/studies from today, please contact us here to follow-up [phone]

## 2017-07-16 NOTE — Progress Notes (Signed)
BP 136/82   Pulse 95   Temp 98.2 F (36.8 C) (Oral)   Resp 14   Ht 5\' 5"  (1.651 m)   Wt 213 lb 3.2 oz (96.7 kg)   SpO2 94%   BMI 35.48 kg/m    Subjective:    Patient ID: Debra Mccann, female    DOB: Jul 29, 1955, 62 y.o.   MRN: [id number]  HPI: Debra Mccann is a 62 y.o. female  Chief Complaint  Patient presents with  . Medication Refill  . Follow-up    HPI Patient is here for f/u of depression She would miss two or three days of work at a time for about a month, would just lay in bed sometimes Started to feel better about two weeks ago Would like to see Dr. 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 She does not want to make a mistake on anybody's money; learned a 2nd payroll system since she's been there; so much going on at work; people don't always follow through like they should; she is kind of OCD and wants everything right after it's been done She is afraid she won't meet deadlines, her deadlines rely on other people getting her information; she stresses because she rushes to get something done No mania or hypomania on the higher dose on cymbalta; feels like it is working well She is a whole lot better this week and a most of last week  High cholesterol; she was put on another cholesterol medicine; no problems, no muscle aches; she admits that she doesn't eat very well; does not eat 3 meals a day  Depression screen [facility] 2/9 07/16/2017 03/20/2017 10/22/2016 05/23/2016 03/15/2016  Decreased Interest 0 3 0 - 0  Down, Depressed, Hopeless 0 3 0 1 0  PHQ - 2 Score 0 6 0 1 0  Altered sleeping - 0 - - -  Tired, decreased energy - 3 - - -  Change in appetite - 3 - - -  Feeling bad or failure about yourself  - 3 - - -  Trouble concentrating - 1 - - -  Moving slowly or fidgety/restless - 0 - - -  Suicidal  thoughts - 0 - - -  PHQ-9 Score - 16 - - -  Difficult doing work/chores - Very difficult - - -    Relevant past medical, surgical, family and social history reviewed Past Medical History:  Diagnosis Date  . Asthma   . Bronchitis   . Cancer ([place])    lung cancer  . Cervical high risk HPV (human papillomavirus) test positive 10/24/2016  . COPD (chronic obstructive pulmonary disease) ([place])   . Depression   . HPV (human papilloma virus) infection 10/22/2016   Evaluated by [place]  . LGSIL on Pap smear of cervix 10/24/2016   With HPV(+) and mild dysplasia  . Lung cancer ([place]) 2004  . OA (osteoarthritis) of hip    right  . Osteopenia   . Personal history of chemotherapy   . Urticaria    Past Surgical History:  Procedure Laterality Date  . ABDOMINAL HYSTERECTOMY  1997   partial, only ovaries remain; due to heavy bleeding  . CHOLECYSTECTOMY  1999  . COLONOSCOPY WITH PROPOFOL N/A 05/19/2017   Procedure: COLONOSCOPY WITH PROPOFOL;  Surgeon: Jonathon Bellows, MD;  Location: [facility] ENDOSCOPY;  Service: Gastroenterology;  Laterality: N/A;  .  LUNG CANCER SURGERY Right 2004   removed "top portion of right lung"   Family History  Problem Relation Age of Onset  . CAD Mother        with stents  . Heart disease Mother   . Hypertension Mother   . Heart disease Father        3 vessel CABG  . Cancer Father        melanoma  . Hypertension Father   . Gout Father   . Depression Daughter   . Alzheimer's disease Maternal Grandmother   . Diabetes Neg Hx   . Stroke Neg Hx   . COPD Neg Hx   . Breast cancer Neg Hx    Social History   Tobacco Use  . Smoking status: Current Every Day Smoker    Packs/day: 0.50    Years: 23.00    Pack years: 11.50  . Smokeless tobacco: Never Used  Substance Use Topics  . Alcohol use: No    Alcohol/week: 0.0 oz  . Drug use: No    Interim medical history since last visit reviewed. Allergies and medications reviewed  Review of Systems Per HPI unless  specifically indicated above     Objective:    BP 136/82   Pulse 95   Temp 98.2 F (36.8 C) (Oral)   Resp 14   Ht 5\' 5"  (1.651 m)   Wt 213 lb 3.2 oz (96.7 kg)   SpO2 94%   BMI 35.48 kg/m   Wt Readings from Last 3 Encounters:  07/16/17 213 lb 3.2 oz (96.7 kg)  05/19/17 200 lb (90.7 kg)  03/20/17 212 lb 1.6 oz (96.2 kg)    Physical Exam  Constitutional: She appears well-developed and well-nourished.  Obese, weight gain noted  HENT:  Mouth/Throat: Mucous membranes are normal.  Eyes: EOM are normal. No scleral icterus.  Cardiovascular: Normal rate and regular rhythm.  Pulmonary/Chest: Effort normal and breath sounds normal.  Musculoskeletal: She exhibits no edema.  Psychiatric: She has a normal mood and affect. Her behavior is normal.       Assessment & Plan:   Problem List Items Addressed This Visit      Other   Fatigue   Relevant Orders   VITAMIN D 25 Hydroxy (Vit-D Deficiency, Fractures) (Completed)   Vitamin B12 (Completed)   Vitamin D deficiency   Relevant Orders   VITAMIN D 25 Hydroxy (Vit-D Deficiency, Fractures) (Completed)   Elevated red blood cell count   Relevant Orders   CBC with Differential/Platelet (Completed)   Dyslipidemia   Relevant Orders   Lipid panel (Completed)   Major depressive disorder, recurrent episode, moderate with anxious distress (HCC) - Primary   Relevant Medications   DULoxetine (CYMBALTA) 30 MG capsule   Other Relevant Orders   Ambulatory referral to Psychiatry    Other Visit Diagnoses    Bilateral leg edema       try compression stokcings, leg elevation; no need for diuretics at this time       Follow up plan: No follow-ups on file.  An after-visit summary was printed and given to the patient at [place].  Please see the patient instructions which may contain other information and recommendations beyond what is mentioned above in the assessment and plan.  Meds ordered this encounter  Medications  . DULoxetine  (CYMBALTA) 30 MG capsule    Sig: Three by mouth daily; labs and appointment needed please    Dispense:  90 capsule    Refill:  0    Orders Placed This Encounter  Procedures  . VITAMIN D 25 Hydroxy (Vit-D Deficiency, Fractures)  . Vitamin B12  . Lipid panel  . CBC with Differential/Platelet  . Ambulatory referral to Psychiatry

## 2017-07-17 ENCOUNTER — Encounter: Payer: Self-pay | Admitting: Family Medicine

## 2017-07-17 ENCOUNTER — Other Ambulatory Visit: Payer: Self-pay | Admitting: Family Medicine

## 2017-07-17 DIAGNOSIS — Z5181 Encounter for therapeutic drug level monitoring: Secondary | ICD-10-CM

## 2017-07-17 DIAGNOSIS — E785 Hyperlipidemia, unspecified: Secondary | ICD-10-CM

## 2017-07-17 LAB — CBC WITH DIFFERENTIAL/PLATELET
BASOS ABS: 46 {cells}/uL (ref 0–200)
Basophils Relative: 0.5 %
EOS ABS: 543 {cells}/uL — AB (ref 15–500)
EOS PCT: 5.9 %
HCT: 41.5 % (ref 35.0–45.0)
Hemoglobin: 14 g/dL (ref 11.7–15.5)
Lymphs Abs: 1978 cells/uL (ref 850–3900)
MCH: 28.3 pg (ref 27.0–33.0)
MCHC: 33.7 g/dL (ref 32.0–36.0)
MCV: 84 fL (ref 80.0–100.0)
MONOS PCT: 6.1 %
MPV: 11 fL (ref 7.5–12.5)
NEUTROS PCT: 66 %
Neutro Abs: 6072 cells/uL (ref 1500–7800)
PLATELETS: 332 10*3/uL (ref 140–400)
RBC: 4.94 10*6/uL (ref 3.80–5.10)
RDW: 13.6 % (ref 11.0–15.0)
TOTAL LYMPHOCYTE: 21.5 %
WBC mixed population: 561 cells/uL (ref 200–950)
WBC: 9.2 10*3/uL (ref 3.8–10.8)

## 2017-07-17 LAB — LIPID PANEL
CHOL/HDL RATIO: 3.7 (calc) (ref <5.0)
Cholesterol: 207 mg/dL — ABNORMAL HIGH (ref <200)
HDL: 56 mg/dL (ref >50)
LDL CHOLESTEROL (CALC): 118 mg/dL — AB
NON-HDL CHOLESTEROL (CALC): 151 mg/dL — AB (ref <130)
TRIGLYCERIDES: 212 mg/dL — AB (ref <150)

## 2017-07-17 LAB — VITAMIN D 25 HYDROXY (VIT D DEFICIENCY, FRACTURES): Vit D, 25-Hydroxy: 40 ng/mL (ref 30–100)

## 2017-07-17 LAB — VITAMIN B12: VITAMIN B 12: 1517 pg/mL — AB (ref 200–1100)

## 2017-07-17 MED ORDER — ATORVASTATIN CALCIUM 40 MG PO TABS: 40.0000 mg | ORAL_TABLET | Freq: Every day | ORAL | 1 refills | Status: DC

## 2017-07-17 NOTE — Progress Notes (Signed)
increase dose of statin; recheck around July 5th

## 2017-07-22 ENCOUNTER — Encounter: Payer: Self-pay | Admitting: Family Medicine

## 2017-07-24 ENCOUNTER — Encounter: Payer: Self-pay | Admitting: Family Medicine

## 2017-08-01 ENCOUNTER — Ambulatory Visit
Admission: RE | Admit: 2017-08-01 | Discharge: 2017-08-01 | Disposition: A | Discharge status: Home / Self Care | Payer: BLUE CROSS/BLUE SHIELD | Source: Ambulatory Visit | Attending: Family Medicine | Admitting: Family Medicine

## 2017-08-01 DIAGNOSIS — Z1231 Encounter for screening mammogram for malignant neoplasm of breast: Secondary | ICD-10-CM | POA: Diagnosis present

## 2017-08-05 ENCOUNTER — Encounter: Payer: Self-pay | Admitting: Family Medicine

## 2017-08-07 MED ORDER — DULOXETINE HCL 30 MG PO CPEP: ORAL_CAPSULE | ORAL | 1 refills | Status: DC

## 2017-08-07 NOTE — Addendum Note (Signed)
Addended by: Devlin Brink, Satira Anis on: 08/07/2017 08:10 AM   Modules accepted: Orders

## 2017-09-05 ENCOUNTER — Ambulatory Visit: Payer: Self-pay | Admitting: *Deleted

## 2017-09-05 NOTE — Telephone Encounter (Signed)
I referred patient to psychiatrist in May; please check on this I will not be able to do a house call If her depression is this severe, I am going to recommend that they take her now to either [facility] or to the ER for evaluation to see if she needs to be admitted to the hospital

## 2017-09-05 NOTE — Telephone Encounter (Signed)
Patient's husband is calling with concerns about his wife. He is concerned about the changes in her in the last 1 1/2 months- patient has been staying in bed extended periods of time. She stays in the bed for 2-3 days and then goes to work 2-3 days- then sleeps all week end- then the cycle repeats. He is afraid she is going to lose her job.   Patient complains of headache, dizziness and nausea. Patient has appointment with psychiatrist  but he is not sure when it is scheduled. Husband is very concerned and wants to know if Dr Sanda Klein can make house call- he is worried to death about his wife. He states her children are not able to get through to her.  Best contact- [phone]  Husband Answered: Reason for Disposition . Symptoms interfere with work or school  Answer Assessment - Initial Assessment Questions 1. CONCERN: "What happened that made you call today?"     Wife has had changes - ongoing. Started a little over a year ago- but the last couple months things have gotten worse. Patient has a good job and husband feels she is going to lose job 2. DEPRESSION SYMPTOM SCREENING: "How are you feeling overall?" (e.g., decreased energy, increased sleeping or difficulty sleeping, difficulty concentrating, feelings of sadness, guilt, hopelessness, or worthlessness)     Patient is having hard time eating- she gets nauseous, she crys, she is in the bed. She is not leaving the house. 3. RISK OF HARM - SUICIDAL IDEATION:  "Do you ever have thoughts of hurting or killing yourself?"  (e.g., yes, no, no but preoccupation with thoughts about death)   - INTENT:  "Do you have thoughts of hurting or killing yourself right NOW?" (e.g., yes, no, N/A)   - PLAN: "Do you have a specific plan for how you would do this?" (e.g., gun, knife, overdose, no plan, N/A)     No- patient is not eating or drinking 4. RISK OF HARM - HOMICIDAL IDEATION:  "Do you ever have thoughts of hurting or killing someone else?"  (e.g., yes, no,  no but preoccupation with thoughts about death)   - INTENT:  "Do you have thoughts of hurting or killing someone right NOW?" (e.g., yes, no, N/A)   - PLAN: "Do you have a specific plan for how you would do this?" (e.g., gun, knife, no plan, N/A)      no 5. FUNCTIONAL IMPAIRMENT: "How have things been going for you overall in your life? Have you had any more difficulties than usual doing your normal daily activities?"  (e.g., better, same, worse; self-care, school, work, interactions)     Husband states she is sleeping-she only worked Monday and Tuesday- she has been in the bed since 6. SUPPORT: "Who is with you now?" "Who do you live with?" "Do you have family or friends nearby who you can talk to?"      Children are not getting through to her 81. THERAPIST: "Do you have a counselor or therapist? Name?"     She has an appointment this month 8. STRESSORS: "Has there been any new stress or recent changes in your life?"     No changes that husband is aware of- he is having trouble talking to her 9. DRUG ABUSE/ALCOHOL: "Do you drink alcohol or use any illegal drugs?"      no 10. OTHER: "Do you have any other health or medical symptoms right now?" (e.g., fever)       Headache, dizziness and  nausea- general malaise  11. PREGNANCY: "Is there any chance you are pregnant?" "When was your last menstrual period?"       n/a  Protocols used: DEPRESSION-A-AH

## 2017-09-05 NOTE — Telephone Encounter (Signed)
Spoke with Mr. Debra Mccann to inform him that Dr. Sanda Klein does not make home visits and that Mrs. Debra Mccann needs to be taken now to either [facility] or the ER for evaluation if her depression is this severe. Mr. Debra Mccann verbalized understanding.  I also inform Mr. Debra Mccann that I would call him back to let him know when Mrs. Debra Mccann & [facility]bra Mccann [facility] psychiatrist appointment is scheduled because he stated that she has one but he is not sure when it is.

## 2017-09-14 ENCOUNTER — Other Ambulatory Visit: Payer: Self-pay | Admitting: Family Medicine

## 2017-09-15 ENCOUNTER — Other Ambulatory Visit: Payer: Self-pay

## 2017-09-15 DIAGNOSIS — Z5181 Encounter for therapeutic drug level monitoring: Secondary | ICD-10-CM

## 2017-09-15 DIAGNOSIS — E785 Hyperlipidemia, unspecified: Secondary | ICD-10-CM

## 2017-09-15 NOTE — Telephone Encounter (Signed)
Patient is overdue for labs Please ask her to have those done so we can see if the dose is correct Thank you

## 2017-09-15 NOTE — Telephone Encounter (Signed)
Pt.notified

## 2017-11-14 ENCOUNTER — Encounter: Payer: BLUE CROSS/BLUE SHIELD | Admitting: Family Medicine

## 2018-08-26 ENCOUNTER — Ambulatory Visit: Payer: Self-pay

## 2018-09-28 ENCOUNTER — Other Ambulatory Visit: Payer: Self-pay

## 2018-09-29 ENCOUNTER — Ambulatory Visit: Payer: Self-pay

## 2018-10-27 ENCOUNTER — Other Ambulatory Visit: Payer: Self-pay

## 2018-10-28 ENCOUNTER — Ambulatory Visit: Payer: Self-pay | Attending: Oncology | Admitting: *Deleted

## 2018-10-28 ENCOUNTER — Ambulatory Visit
Admission: RE | Admit: 2018-10-28 | Discharge: 2018-10-28 | Disposition: A | Discharge status: Home / Self Care | Payer: Self-pay | Source: Ambulatory Visit | Attending: Oncology | Admitting: Oncology

## 2018-10-28 ENCOUNTER — Encounter: Payer: Self-pay | Admitting: *Deleted

## 2018-10-28 ENCOUNTER — Other Ambulatory Visit: Payer: Self-pay

## 2018-10-28 VITALS — BP 140/91 | HR 59 | Temp 99.2°F | Resp 18 | Ht 65.0 in | Wt 185.9 lb

## 2018-10-28 DIAGNOSIS — Z0000 Encounter for general adult medical examination without abnormal findings: Secondary | ICD-10-CM

## 2018-10-28 DIAGNOSIS — Z1231 Encounter for screening mammogram for malignant neoplasm of breast: Secondary | ICD-10-CM | POA: Insufficient documentation

## 2018-10-28 NOTE — Progress Notes (Signed)
  Subjective:     Patient ID: Debra Mccann, female   DOB: March 01, 1955, 63 y.o.   MRN: [id number]  HPI   Review of Systems     Objective:   Physical Exam Chest:     Breasts:        Right: No swelling, bleeding, inverted nipple, mass, nipple discharge, skin change or tenderness.        Left: No swelling, bleeding, inverted nipple, mass, nipple discharge, skin change or tenderness.     Comments: Large pendulous breast Lymphadenopathy:     Upper Body:     Right upper body: No supraclavicular or axillary adenopathy.     Left upper body: No supraclavicular or axillary adenopathy.        Assessment:     63 year old White female presents to [facility] for clinical breast exam and mammogram only.  Clinical breast exam without dominant mass, skin changes, nipple discharge or lymphadenopathy. Taught self breast awareness.  Patient with a history of hysterectomy.  No pap per protocol.  Harriett Rush breast risk model score is 7%.  Patient at average lifetime risk of breast cancer. Patient has been screened for eligibility.  She does not have any insurance, Medicare or Medicaid.  She also meets financial eligibility.  Hand-out given on the Affordable Care Act.    Plan:     Screening mammogram ordered.  Will follow up per [facility] protocol.

## 2018-10-29 ENCOUNTER — Encounter: Payer: Self-pay | Admitting: *Deleted

## 2018-10-29 NOTE — Progress Notes (Signed)
Letter mailed from the [facility] to inform patient of her normal mammogram results.  Patient is to follow-up with annual screening in one year.  HSIS to [place].

## 2019-11-12 ENCOUNTER — Other Ambulatory Visit: Payer: Self-pay

## 2019-11-12 ENCOUNTER — Encounter: Payer: Self-pay | Admitting: Emergency Medicine

## 2019-11-12 DIAGNOSIS — M79604 Pain in right leg: Secondary | ICD-10-CM | POA: Insufficient documentation

## 2019-11-12 DIAGNOSIS — R202 Paresthesia of skin: Secondary | ICD-10-CM | POA: Insufficient documentation

## 2019-11-12 DIAGNOSIS — M25551 Pain in right hip: Secondary | ICD-10-CM | POA: Insufficient documentation

## 2019-11-12 DIAGNOSIS — Z5321 Procedure and treatment not carried out due to patient leaving prior to being seen by health care provider: Secondary | ICD-10-CM | POA: Insufficient documentation

## 2019-11-12 LAB — URINALYSIS, COMPLETE (UACMP) WITH MICROSCOPIC
Bacteria, UA: NONE SEEN
Bilirubin Urine: NEGATIVE
Glucose, UA: NEGATIVE mg/dL
Hgb urine dipstick: NEGATIVE
Ketones, ur: NEGATIVE mg/dL
Leukocytes,Ua: NEGATIVE
Nitrite: NEGATIVE
Protein, ur: NEGATIVE mg/dL
Specific Gravity, Urine: 1.008 (ref 1.005–1.030)
Squamous Epithelial / LPF: NONE SEEN (ref 0–5)
pH: 7 (ref 5.0–8.0)

## 2019-11-12 NOTE — ED Triage Notes (Signed)
Pt states that she has been having right leg pain today which starts in her right hip and shoots down the leg. Pt denies injury or trauma and reports some numbness to leg. Pt states that she started noticing this pain around 1530 and denies injury or trauma.

## 2019-11-13 ENCOUNTER — Emergency Department
Admission: EM | Admit: 2019-11-13 | Discharge: 2019-11-13 | Disposition: A | Discharge status: Home / Self Care | Payer: Self-pay | Attending: Emergency Medicine | Admitting: Emergency Medicine

## 2019-11-13 NOTE — ED Notes (Signed)
Called, no answer. Not seen in lobby

## 2019-11-30 NOTE — Progress Notes (Signed)
A TELEVISIT was used to enroll patient back into our BCCCP program.   2 identifers used to confirm I was speaking to the correct patient.  Patient gave verbal consent to be back in our program.  Patient has been screened for eligibility.  She does not have any insurance, Medicare or Medicaid.  She also meets financial eligibility.  Denies any breast changes.  History of partial hysterectomy.  Patient informed to go directly to the [facility] at 10:30. Risk Assessment    Risk Scores      12/01/2019 10/26/2018   Last edited by: Rico Junker, RN [place], Laddie Aquas, CMA   5-year risk: 1.7 % 1.6 %   Lifetime risk: 7.7 % 7.9 %

## 2019-12-01 ENCOUNTER — Ambulatory Visit
Admission: RE | Admit: 2019-12-01 | Discharge: 2019-12-01 | Disposition: A | Discharge status: Home / Self Care | Payer: Self-pay | Source: Ambulatory Visit | Attending: Oncology | Admitting: Oncology

## 2019-12-01 ENCOUNTER — Ambulatory Visit: Payer: Self-pay | Attending: Oncology

## 2019-12-01 ENCOUNTER — Other Ambulatory Visit: Payer: Self-pay

## 2019-12-01 DIAGNOSIS — Z0000 Encounter for general adult medical examination without abnormal findings: Secondary | ICD-10-CM | POA: Insufficient documentation

## 2019-12-03 NOTE — Progress Notes (Signed)
Letter mailed from [facility] to notify of normal mammogram results.  Patient to return in one year for annual screening.  Copy to [facility]. 

## 2020-08-23 DIAGNOSIS — M67432 Ganglion, left wrist: Secondary | ICD-10-CM | POA: Insufficient documentation

## 2020-10-25 ENCOUNTER — Other Ambulatory Visit: Payer: Self-pay | Admitting: Nurse Practitioner

## 2020-10-25 DIAGNOSIS — Z1231 Encounter for screening mammogram for malignant neoplasm of breast: Secondary | ICD-10-CM

## 2020-12-04 ENCOUNTER — Ambulatory Visit
Admission: RE | Admit: 2020-12-04 | Discharge: 2020-12-04 | Disposition: A | Discharge status: Home / Self Care | Payer: 59 | Source: Ambulatory Visit | Attending: Nurse Practitioner | Admitting: Nurse Practitioner

## 2020-12-04 ENCOUNTER — Other Ambulatory Visit: Payer: Self-pay

## 2020-12-04 DIAGNOSIS — Z1231 Encounter for screening mammogram for malignant neoplasm of breast: Secondary | ICD-10-CM | POA: Diagnosis not present

## 2020-12-21 ENCOUNTER — Ambulatory Visit: Payer: Self-pay

## 2020-12-21 NOTE — Telephone Encounter (Signed)
Patient called and says she has a new doctor and will need to know the type of pneumonia vaccine she received at [facility]. I advised it was given 10/18/2005 Pneumococcal Polysaccharide-23. Patient verbalized understanding.  Reason for Disposition  Health Information question, no triage required and triager able to answer question  Answer Assessment - Initial Assessment Questions 1. REASON FOR CALL or QUESTION: "What is your reason for calling today?" or "How can I best help you?" or "What question do you have that I can help answer?"     What kind of pneumonia vaccine did I receive  Protocols used: Information Only Call - No Triage-A-AH

## 2021-03-12 DIAGNOSIS — E782 Mixed hyperlipidemia: Secondary | ICD-10-CM | POA: Diagnosis not present

## 2021-03-12 DIAGNOSIS — R5383 Other fatigue: Secondary | ICD-10-CM | POA: Diagnosis not present

## 2021-03-12 DIAGNOSIS — I10 Essential (primary) hypertension: Secondary | ICD-10-CM | POA: Diagnosis not present

## 2021-03-19 DIAGNOSIS — E782 Mixed hyperlipidemia: Secondary | ICD-10-CM | POA: Diagnosis not present

## 2021-03-19 DIAGNOSIS — U071 COVID-19: Secondary | ICD-10-CM | POA: Diagnosis not present

## 2021-03-19 DIAGNOSIS — R69 Illness, unspecified: Secondary | ICD-10-CM | POA: Diagnosis not present

## 2021-03-19 DIAGNOSIS — I10 Essential (primary) hypertension: Secondary | ICD-10-CM | POA: Diagnosis not present

## 2021-03-19 DIAGNOSIS — R7301 Impaired fasting glucose: Secondary | ICD-10-CM | POA: Diagnosis not present

## 2021-04-11 DIAGNOSIS — Z20822 Contact with and (suspected) exposure to covid-19: Secondary | ICD-10-CM | POA: Diagnosis not present

## 2021-05-10 ENCOUNTER — Emergency Department: Payer: 59

## 2021-05-10 ENCOUNTER — Other Ambulatory Visit: Payer: Self-pay

## 2021-05-10 ENCOUNTER — Observation Stay
Admission: EM | Admit: 2021-05-10 | Discharge: 2021-05-12 | Disposition: A | Discharge status: Home / Self Care | Payer: 59 | Attending: Internal Medicine | Admitting: Internal Medicine

## 2021-05-10 DIAGNOSIS — M1611 Unilateral primary osteoarthritis, right hip: Secondary | ICD-10-CM | POA: Diagnosis present

## 2021-05-10 DIAGNOSIS — Z20822 Contact with and (suspected) exposure to covid-19: Secondary | ICD-10-CM | POA: Diagnosis not present

## 2021-05-10 DIAGNOSIS — Z880 Allergy status to penicillin: Secondary | ICD-10-CM

## 2021-05-10 DIAGNOSIS — J9601 Acute respiratory failure with hypoxia: Secondary | ICD-10-CM

## 2021-05-10 DIAGNOSIS — F17200 Nicotine dependence, unspecified, uncomplicated: Secondary | ICD-10-CM

## 2021-05-10 DIAGNOSIS — J209 Acute bronchitis, unspecified: Secondary | ICD-10-CM | POA: Diagnosis not present

## 2021-05-10 DIAGNOSIS — F32A Depression, unspecified: Secondary | ICD-10-CM

## 2021-05-10 DIAGNOSIS — J441 Chronic obstructive pulmonary disease with (acute) exacerbation: Secondary | ICD-10-CM | POA: Diagnosis not present

## 2021-05-10 DIAGNOSIS — D751 Secondary polycythemia: Secondary | ICD-10-CM | POA: Diagnosis present

## 2021-05-10 DIAGNOSIS — Z90710 Acquired absence of both cervix and uterus: Secondary | ICD-10-CM

## 2021-05-10 DIAGNOSIS — F419 Anxiety disorder, unspecified: Secondary | ICD-10-CM | POA: Diagnosis present

## 2021-05-10 DIAGNOSIS — Z9049 Acquired absence of other specified parts of digestive tract: Secondary | ICD-10-CM

## 2021-05-10 DIAGNOSIS — J449 Chronic obstructive pulmonary disease, unspecified: Secondary | ICD-10-CM | POA: Diagnosis not present

## 2021-05-10 DIAGNOSIS — Z9221 Personal history of antineoplastic chemotherapy: Secondary | ICD-10-CM

## 2021-05-10 DIAGNOSIS — Z7982 Long term (current) use of aspirin: Secondary | ICD-10-CM

## 2021-05-10 DIAGNOSIS — Z85118 Personal history of other malignant neoplasm of bronchus and lung: Secondary | ICD-10-CM

## 2021-05-10 DIAGNOSIS — R0602 Shortness of breath: Principal | ICD-10-CM

## 2021-05-10 DIAGNOSIS — Z8619 Personal history of other infectious and parasitic diseases: Secondary | ICD-10-CM

## 2021-05-10 DIAGNOSIS — F17210 Nicotine dependence, cigarettes, uncomplicated: Secondary | ICD-10-CM | POA: Diagnosis present

## 2021-05-10 DIAGNOSIS — M8580 Other specified disorders of bone density and structure, unspecified site: Secondary | ICD-10-CM | POA: Diagnosis present

## 2021-05-10 DIAGNOSIS — J440 Chronic obstructive pulmonary disease with acute lower respiratory infection: Secondary | ICD-10-CM | POA: Diagnosis present

## 2021-05-10 DIAGNOSIS — Z79899 Other long term (current) drug therapy: Secondary | ICD-10-CM

## 2021-05-10 LAB — COMPREHENSIVE METABOLIC PANEL
ALT: 17 U/L (ref 0–44)
AST: 19 U/L (ref 15–41)
Albumin: 4 g/dL (ref 3.5–5.0)
Alkaline Phosphatase: 41 U/L (ref 38–126)
Anion gap: 11 (ref 5–15)
BUN: 10 mg/dL (ref 8–23)
CO2: 24 mmol/L (ref 22–32)
Calcium: 9.2 mg/dL (ref 8.9–10.3)
Chloride: 103 mmol/L (ref 98–111)
Creatinine, Ser: 0.71 mg/dL (ref 0.44–1.00)
GFR, Estimated: >60 mL/min (ref >60)
Glucose, Bld: 84 mg/dL (ref 70–99)
Potassium: 4.1 mmol/L (ref 3.5–5.1)
Sodium: 138 mmol/L (ref 135–145)
Total Bilirubin: 1.4 mg/dL — ABNORMAL HIGH (ref 0.3–1.2)
Total Protein: 7 g/dL (ref 6.5–8.1)

## 2021-05-10 LAB — CBC WITH DIFFERENTIAL/PLATELET
Abs Immature Granulocytes: 0.02 10*3/uL (ref 0.00–0.07)
Basophils Absolute: 0 10*3/uL (ref 0.0–0.1)
Basophils Relative: 1 %
Eosinophils Absolute: 0.1 10*3/uL (ref 0.0–0.5)
Eosinophils Relative: 2 %
HCT: 47.8 % — ABNORMAL HIGH (ref 36.0–46.0)
Hemoglobin: 15.2 g/dL — ABNORMAL HIGH (ref 12.0–15.0)
Immature Granulocytes: 0 %
Lymphocytes Relative: 14 %
Lymphs Abs: 0.7 10*3/uL (ref 0.7–4.0)
MCH: 28.1 pg (ref 26.0–34.0)
MCHC: 31.8 g/dL (ref 30.0–36.0)
MCV: 88.5 fL (ref 80.0–100.0)
Monocytes Absolute: 0.5 10*3/uL (ref 0.1–1.0)
Monocytes Relative: 10 %
Neutro Abs: 3.8 10*3/uL (ref 1.7–7.7)
Neutrophils Relative %: 73 %
Platelets: 225 10*3/uL (ref 150–400)
RBC: 5.4 MIL/uL — ABNORMAL HIGH (ref 3.87–5.11)
RDW: 14.6 % (ref 11.5–15.5)
WBC: 5.2 10*3/uL (ref 4.0–10.5)
nRBC: 0 % (ref 0.0–0.2)

## 2021-05-10 LAB — RESP PANEL BY RT-PCR (FLU A&B, COVID) ARPGX2
Influenza A by PCR: NEGATIVE
Influenza B by PCR: NEGATIVE
SARS Coronavirus 2 by RT PCR: NEGATIVE

## 2021-05-10 LAB — D-DIMER, QUANTITATIVE: D-Dimer, Quant: 0.27 ug/mL-FEU (ref 0.00–0.50)

## 2021-05-10 MED ORDER — ALBUTEROL SULFATE (2.5 MG/3ML) 0.083% IN NEBU: 2.5000 mg | INHALATION_SOLUTION | RESPIRATORY_TRACT | Status: DC | PRN

## 2021-05-10 MED ORDER — IPRATROPIUM-ALBUTEROL 0.5-2.5 (3) MG/3ML IN SOLN: 3.0000 mL | Freq: Four times a day (QID) | RESPIRATORY_TRACT | Status: DC

## 2021-05-10 MED ORDER — DOXYCYCLINE HYCLATE 100 MG PO TABS
100.0000 mg | ORAL_TABLET | Freq: Two times a day (BID) | ORAL | Status: DC
  Administered 2021-05-11 – 2021-05-12 (×3): 100 mg via ORAL
  Filled 2021-05-10 (×3): qty 1

## 2021-05-10 MED ORDER — IPRATROPIUM-ALBUTEROL 0.5-2.5 (3) MG/3ML IN SOLN
3.0000 mL | Freq: Four times a day (QID) | RESPIRATORY_TRACT | Status: DC
  Filled 2021-05-10: qty 3

## 2021-05-10 MED ORDER — SODIUM CHLORIDE 0.9 % IV SOLN
500.0000 mg | Freq: Once | INTRAVENOUS | Status: AC
  Administered 2021-05-10: 500 mg via INTRAVENOUS
  Filled 2021-05-10: qty 5

## 2021-05-10 MED ORDER — PREDNISONE 20 MG PO TABS
40.0000 mg | ORAL_TABLET | Freq: Every day | ORAL | Status: DC
  Administered 2021-05-12: 40 mg via ORAL
  Filled 2021-05-10: qty 2

## 2021-05-10 MED ORDER — ONDANSETRON HCL 4 MG PO TABS: 4.0000 mg | ORAL_TABLET | Freq: Four times a day (QID) | ORAL | Status: DC | PRN

## 2021-05-10 MED ORDER — ONDANSETRON HCL 4 MG/2ML IJ SOLN: 4.0000 mg | Freq: Four times a day (QID) | INTRAMUSCULAR | Status: DC | PRN

## 2021-05-10 MED ORDER — ENOXAPARIN SODIUM 40 MG/0.4ML IJ SOSY
40.0000 mg | PREFILLED_SYRINGE | INTRAMUSCULAR | Status: DC
  Administered 2021-05-10 – 2021-05-11 (×2): 40 mg via SUBCUTANEOUS
  Filled 2021-05-10 (×2): qty 0.4

## 2021-05-10 MED ORDER — ACETAMINOPHEN 325 MG PO TABS: 650.0000 mg | ORAL_TABLET | Freq: Four times a day (QID) | ORAL | Status: DC | PRN

## 2021-05-10 MED ORDER — METHYLPREDNISOLONE SODIUM SUCC 40 MG IJ SOLR
40.0000 mg | Freq: Two times a day (BID) | INTRAMUSCULAR | Status: AC
  Administered 2021-05-11 (×2): 40 mg via INTRAVENOUS
  Filled 2021-05-10 (×2): qty 1

## 2021-05-10 MED ORDER — ACETAMINOPHEN 650 MG RE SUPP: 650.0000 mg | Freq: Four times a day (QID) | RECTAL | Status: DC | PRN

## 2021-05-10 MED ORDER — IPRATROPIUM-ALBUTEROL 0.5-2.5 (3) MG/3ML IN SOLN
3.0000 mL | Freq: Once | RESPIRATORY_TRACT | Status: AC
  Administered 2021-05-10: 3 mL via RESPIRATORY_TRACT
  Filled 2021-05-10: qty 3

## 2021-05-10 MED ORDER — GUAIFENESIN ER 600 MG PO TB12
600.0000 mg | ORAL_TABLET | Freq: Two times a day (BID) | ORAL | Status: DC
  Administered 2021-05-10 – 2021-05-12 (×4): 600 mg via ORAL
  Filled 2021-05-10 (×4): qty 1

## 2021-05-10 MED ORDER — METHYLPREDNISOLONE SODIUM SUCC 125 MG IJ SOLR
125.0000 mg | Freq: Once | INTRAMUSCULAR | Status: AC
  Administered 2021-05-10: 125 mg via INTRAVENOUS
  Filled 2021-05-10: qty 2

## 2021-05-10 NOTE — Assessment & Plan Note (Addendum)
Schedule and as needed nebulized bronchodilator treatment ?Continue IV steroids and antibiotics: Doxycycline 100 mg IV twice daily ?Guaifenesin twice daily ?Supplemental oxygen to keep sats 90 to 92% she is weaned off to room air now. ?

## 2021-05-10 NOTE — ED Triage Notes (Addendum)
Patient to ER via POV with complaints of shortness of breath x3 days. Reports worse with exertion. Patient also has a productive cough, clear sputum production. Hx of COPD. Also feels nausea, has been unable to use at home breathing treatments today.  ? 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 Denies chest pain.  ? ?Spo2 at home 82%. Room sats in triage 80%. Placed on [place] at 3L, with improvement of sats to 91%.  ?

## 2021-05-10 NOTE — Assessment & Plan Note (Signed)
>>  ASSESSMENT AND PLAN FOR COPD WITH ACUTE BRONCHITIS (HCC) WRITTEN ON 05/11/2021  1:43 PM BY SHAH, VIPUL, MD  Schedule and as needed nebulized bronchodilator treatment Continue IV steroids and antibiotics: Doxycycline 100 mg IV twice daily Guaifenesin twice daily Supplemental oxygen to keep sats 90 to 92% she is weaned off to room air now.

## 2021-05-10 NOTE — Assessment & Plan Note (Addendum)
Continue duloxetine

## 2021-05-10 NOTE — ED Notes (Signed)
ADDITIONAL BLUE/ GREY TOP SENT TO LAB ?

## 2021-05-10 NOTE — H&P (Addendum)
?History and Physical  ? ? ?Patient: Debra Mccann ZYS:[id number] DOB: [birth year] ?DOA: 05/10/2021 ?DOS: the patient was seen and examined on 05/10/2021 ?PCP: Danelle Berry, NP  ?Patient coming from: Home ? ?Chief Complaint:  ?Chief Complaint  ?Patient presents with  ? Shortness of Breath  ? ? ?HPI: Debra Mccann is a 66 y.o. female with medical history significant for COPD, tobacco use disorder, non-small cell CA s/p RUL resection 2004, last seen by pulmonology December 2018 per chart review who presents to the ED with a 3-day history of shortness of breath, and cough.  Denies fever or chills.  Denies chest pain.  Denies lower extremity pain or swelling.  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  O2 sats at home 82% on room air, improving to 91% on 3 L in the ED. ?ED course: On arrival temp 99, tachycardic to 103 and tachypneic to 22 with O2 sat 80% on room air improving to 91% on 3 L ?Blood work, CBC and CMP for the most part unremarkable except for mild polycythemia with hemoglobin 15 and total bilirubin 1.4.  COVID and flu negative.  EKG, personally viewed and interpreted: NSR at 96 with no acute ST-T wave changes.  Chest x-ray with postoperative changes on the right but no active disease. ?Patient treated with DuoNebs and Solu-Medrol and given a dose of azithromycin IV.  Hospitalist consulted for admission.  ? ?Review of Systems: As mentioned in the history of present illness. All other systems reviewed and are negative. ?Past Medical History:  ?Diagnosis Date  ? Asthma   ? Bronchitis   ? Cancer [facility])   ? lung cancer  ? Cervical high risk HPV (human papillomavirus) test positive 10/24/2016  ? COPD (chronic obstructive pulmonary disease) ([place])   ? Depression   ? HPV (human papilloma virus) infection 10/22/2016  ? Evaluated by GYN [facility]  ? LGSIL on Pap smear of cervix 10/24/2016  ? With HPV(+) and mild dysplasia  ? Lung cancer [facility]) 2004  ? Lung cancer ([place])   ?  OA (osteoarthritis) of hip   ? right  ? Osteopenia   ? Personal history of chemotherapy   ? Urticaria   ? ?Past Surgical History:  ?Procedure Laterality Date  ? ABDOMINAL HYSTERECTOMY  1997  ? partial, only ovaries remain; due to heavy bleeding  ? CHOLECYSTECTOMY  1999  ? COLONOSCOPY WITH PROPOFOL N/A 05/19/2017  ? Procedure: COLONOSCOPY WITH PROPOFOL;  Surgeon: Jonathon Bellows, MD;  Location: [facility] ENDOSCOPY;  Service: Gastroenterology;  Laterality: N/A;  ? LUNG CANCER SURGERY Right 2004  ? removed "top portion of right lung"  ? ?Social History:  reports that she has been smoking. She has a 11.50 pack-year smoking history. She has never used smokeless tobacco. She reports that she does not drink alcohol and does not use drugs. ? ?Allergies  ?Allergen Reactions  ? Penicillins   ? ? ?Family History  ?Problem Relation Age of Onset  ? CAD Mother   ?     with stents  ? Heart disease Mother   ? Hypertension Mother   ? Heart disease Father   ?     3 vessel CABG  ? Cancer Father   ?     melanoma  ? Hypertension Father   ? Gout Father   ? Depression Daughter   ? Alzheimer's disease Maternal Grandmother   ? Breast cancer Cousin   ? Diabetes Neg Hx   ?  Stroke Neg Hx   ? COPD Neg Hx   ? ? ?Prior to Admission medications   ?Medication Sig Start Date End Date Taking? Authorizing Provider  ?ACETAMINOPHEN-BUTALBITAL 50-325 MG TABS One by mouth every six hours if needed for headaches ?Patient not taking: Reported on 07/16/2017 03/20/17   Arnetha Courser, MD  ?albuterol (PROAIR HFA) 108 (90 Base) MCG/ACT inhaler Inhale 2 puffs into the lungs every 6 (six) hours as needed.  06/17/15   [provider]  ?aspirin EC 81 MG tablet Take 81 mg by mouth daily.    [provider]  ?atorvastatin (LIPITOR) 40 MG tablet TAKE 1 TABLET BY MOUTH EVERY NIGHT AT BEDTIME 09/15/17   Lada, Satira Anis, MD  ?cetirizine (ZYRTEC) 10 MG tablet Take 10 mg by mouth daily.    [provider]  ?Cholecalciferol (VITAMIN D-1000 MAX ST) 1000 units  tablet Take by mouth.    [provider]  ?docusate sodium (COLACE) 100 MG capsule Take 100 mg by mouth daily.    [provider]  ?DULoxetine (CYMBALTA) 30 MG capsule Three pills by mouth daily 08/07/17   Lada, Satira Anis, MD  ?fluticasone (FLONASE) 50 MCG/ACT nasal spray Place into the nose. 02/08/15 05/23/16  [provider]  ?Fluticasone-Salmeterol (ADVAIR DISKUS) 500-50 MCG/DOSE AEPB Inhale 1 puff into the lungs 2 (two) times daily. ?Patient not taking: Reported on 07/16/2017 11/23/14   Arnetha Courser, MD  ?ibuprofen (ADVIL,MOTRIN) 200 MG tablet Take 800 mg by mouth every 8 (eight) hours as needed.     [provider]  ?Multiple Vitamin (MULTIVITAMIN WITH MINERALS) TABS tablet Take 1 tablet by mouth daily.    [provider]  ?omeprazole (PRILOSEC) 40 MG capsule Take 1 capsule (40 mg total) by mouth daily. 03/20/17 04/19/17  Arnetha Courser, MD  ?Probiotic Product (PRO-BIOTIC BLEND PO) Take 1 tablet by mouth daily.    [provider]  ?vitamin B-12 (CYANOCOBALAMIN) 100 MCG tablet Take 100 mcg by mouth daily.    [provider]  ? ? ?Physical Exam: ?Vitals:  ? 05/10/21 1900 05/10/21 2000 05/10/21 2030 05/10/21 2100  ?BP: 120/62 124/63 117/60 128/74  ?Pulse: 90 85 84 81  ?Resp: 16 20 16 20   ?Temp:    98.2 ?F (36.8 ?C)  ?TempSrc:    Oral  ?SpO2: 99% 97% 96% 98%  ?Weight:      ?Height:      ? ?Physical Exam ?Vitals and nursing note reviewed.  ?Constitutional:   ?   General: She is not in acute distress. ?   Appearance: Normal appearance.  ?HENT:  ?   Head: Normocephalic and atraumatic.  ?Cardiovascular:  ?   Rate and Rhythm: Normal rate and regular rhythm.  ?   Pulses: Normal pulses.  ?   Heart sounds: Normal heart sounds. No murmur heard. ?Pulmonary:  ?   Effort: Pulmonary effort is normal.  ?   Breath sounds: Normal breath sounds. No wheezing or rhonchi.  ?   Comments: Scattered wheezes  and rhonchi ?Abdominal:  ?   General: Bowel sounds are normal.  ?    Palpations: Abdomen is soft.  ?   Tenderness: There is no abdominal tenderness.  ?Musculoskeletal:     ?   General: No swelling or tenderness. Normal range of motion.  ?   Cervical back: Normal range of motion and neck supple.  ?Skin: ?   General: Skin is warm and dry.  ?Neurological:  ?   General: No focal  deficit present.  ?   Mental Status: She is alert. Mental status is at baseline.  ?Psychiatric:     ?   Mood and Affect: Mood normal.     ?   Behavior: Behavior normal.  ? ? ? ?Data Reviewed: ?Relevant notes from primary care and specialist visits, past discharge summaries as available in EHR, including Care Everywhere. ?Prior diagnostic testing as pertinent to current admission diagnoses ?Updated medications and problem lists for reconciliation ?ED course, including vitals, labs, imaging, treatment and response to treatment ?Triage notes, nursing and pharmacy notes and ED provider's notes ?Notable results as noted in HPI ? ? ?Assessment and Plan: ?* COPD with acute bronchitis ([place]) ?Schedule and as needed nebulized bronchodilator treatment ?IV steroids ?We will give antibiotics: Doxycycline 100 mg IV twice daily ?Guaifenesin twice daily ?Supplemental oxygen to keep sats 90 to 92% ? ?Acute respiratory failure with hypoxia ([place]) ?O2 sat 80% on room air with no prior history of home oxygen use ?Suspect related to COPD however given history of lung cancer we will get D-dimer and pursue evaluation with CTA chest if elevated ?Continue supplemental oxygen and wean as tolerated ? ?History of primary non-small cell carcinoma of right lung s/p RUL resection 2004 ?No acute issues suspected ?Last pulmonology follow-up in EHR was in 2018 ? ?Anxiety and depression ?Continue duloxetine pending med verification ? ?Erythrocytosis ?Intermittent erythrocytosis on review over several years ? ? ?Tobacco use disorder ?Smokes a pack a day  ?Declines nicotine patch.  Plans to cut back after this episode of illness ? ? ? ?Advance Care  Planning:   Code Status: Full Code  ? ?Consults: none ? ?Family Communication: none ? ?Severity of Illness: ?The appropriate patient status for this patient is OBSERVATION. Observation status is judged to

## 2021-05-10 NOTE — Assessment & Plan Note (Addendum)
O2 sat 80% on room air with no prior history of home oxygen use.  Now she is weaned off to room air ?Suspect related to COPD  ? ?

## 2021-05-10 NOTE — ED Provider Notes (Signed)
? ?[facility] ?Provider Note ? ? Event Date/Time  ? First MD Initiated Contact with Patient 05/10/21 1825   ?  (approximate) ?History  ?Shortness of Breath ? ?HPI ?Debra Mccann is a 66 y.o. female with a stated past medical history of COPD who presents for worsening shortness of breath over the last 3-4 days in the setting of productive cough and subjective fevers.  Patient was found with oxygen in the 80s initially that responded well to 3 L nasal cannula.  Patient states that she has never had a COPD exacerbation in the past and therefore does not know what this feels like.  Patient denies any chest pain but does endorse "tightness".  Patient also endorses dyspnea on exertion.  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 ?Physical Exam  ?Triage Vital Signs: ?ED Triage Vitals  ?Enc Vitals Group  ?   BP 05/10/21 1803 (!) 131/115  ?   Pulse Rate 05/10/21 1803 (!) 103  ?   Resp 05/10/21 1803 (!) 22  ?   Temp 05/10/21 1803 99 ?F (37.2 ?C)  ?   Temp Source 05/10/21 1803 Oral  ?   SpO2 05/10/21 1803 (!) 80 %  ?   Weight 05/10/21 1803 140 lb (63.5 kg)  ?   Height 05/10/21 1803 5\' 5"  (1.651 m)  ?   Head Circumference --   ?   Peak Flow --   ?   Pain Score 05/10/21 1803 0  ?   Pain Loc --   ?   Pain Edu? --   ?   Excl. in [place]? --   ? ?Most recent vital signs: ?Vitals:  ? 05/10/21 1841 05/10/21 1900  ?BP: 121/61 120/62  ?Pulse:  90  ?Resp:  16  ?Temp:    ?SpO2:  99%  ? ?General: Awake, oriented x4. ?CV:  Good peripheral perfusion.  ?Resp:  Increased effort.  Inspiratory and expiratory wheezes over bilateral lung fields. ?Abd:  No distention.  ?Other:  Elderly Caucasian female laying in bed in mild respiratory distress with nasal cannula in place ?ED Results / Procedures / Treatments  ?Labs ?(all labs ordered are listed, but only abnormal results are displayed) ?Labs Reviewed  ?CBC WITH DIFFERENTIAL/PLATELET -  Abnormal; Notable for the following components:  ?    Result Value  ? RBC 5.40 (*)   ? Hemoglobin 15.2 (*)   ? HCT 47.8 (*)   ? All other components within normal limits  ?COMPREHENSIVE METABOLIC PANEL - Abnormal; Notable for the following components:  ? Total Bilirubin 1.4 (*)   ? All other components within normal limits  ?RESP PANEL BY RT-PCR (FLU A&B, COVID) ARPGX2  ? ?EKG ?ED ECG REPORT ?I, Naaman Plummer, the attending physician, personally viewed and interpreted this ECG. ?Date: 05/10/2021 ?EKG Time: 1807 ?Rate: 98 ?Rhythm: normal sinus rhythm ?QRS Axis: normal ?Intervals: normal ?ST/T Wave abnormalities: normal ?Narrative Interpretation: no evidence of acute ischemia ?RADIOLOGY ?ED MD interpretation: 2 view chest x-ray interpreted by me shows no evidence of acute abnormalities including no pneumonia, pneumothorax, or widened mediastinum ?-Agree with radiology assessment ?Official radiology report(s): ?DG Chest 2 View ? ?Result Date: 05/10/2021 ?CLINICAL DATA:  Shortness of breath EXAM: CHEST - 2 VIEW COMPARISON:  03/15/2016. FINDINGS: Postoperative changes on the right. Scarring in the right apex and base. Left lung clear. No effusions. Heart is normal size. No acute bony abnormality. IMPRESSION:  Postoperative changes on the right.  No active disease. Electronically Signed   By: Rolm Baptise M.D.   On: 05/10/2021 18:29   ?PROCEDURES: ?Critical Care performed: Yes, see critical care procedure note(s) ?.1-3 Lead EKG Interpretation ?Performed by: Naaman Plummer, MD ?Authorized by: Naaman Plummer, MD  ? ?  Interpretation: normal   ?  ECG rate:  91 ?  ECG rate assessment: normal   ?  Rhythm: sinus rhythm   ?  Ectopy: none   ?  Conduction: normal   ?CRITICAL CARE ?Performed by: Naaman Plummer ? ?Total critical care time: 31 minutes ? ?Critical care time was exclusive of separately billable procedures and treating other patients. ? ?Critical care was necessary to treat or prevent imminent or life-threatening  deterioration. ? ?Critical care was time spent personally by me on the following activities: development of treatment plan with patient and/or surrogate as well as nursing, discussions with consultants, evaluation of patient's response to treatment, examination of patient, obtaining history from patient or surrogate, ordering and performing treatments and interventions, ordering and review of laboratory studies, ordering and review of radiographic studies, pulse oximetry and re-evaluation of patient's condition. ? ?MEDICATIONS ORDERED IN ED: ?Medications  ?methylPREDNISolone sodium succinate (SOLU-MEDROL) 125 mg/2 mL injection 125 mg (125 mg Intravenous Given 05/10/21 1926)  ?ipratropium-albuterol (DUONEB) 0.5-2.5 (3) MG/3ML nebulizer solution 3 mL (3 mLs Nebulization Given 05/10/21 1926)  ?azithromycin (ZITHROMAX) 500 mg in sodium chloride 0.9 % 250 mL IVPB (500 mg Intravenous New Bag/Given 05/10/21 1931)  ? ?IMPRESSION / MDM / ASSESSMENT AND PLAN / ED COURSE  ?I reviewed the triage vital signs and the nursing notes. ?             ?               ?The patient is on the cardiac monitor to evaluate for evidence of arrhythmia and/or significant heart rate changes. ? ?The patient appears to be suffering from a moderate/severe exacerbation of COPD. ? ?Based on the history, exam, CXR/EKG reviewed by me, and further workup I don?t suspect any other emergent cause of this presentation, such as pneumonia, acute coronary syndrome, congestive heart failure, pulmonary embolism, or pneumothorax. ? ?ED Interventions: bronchodilators, steroids, antibiotics, reassess ? ?Reassessment: After treatment, the patient?s shortness of breath is improving but patient is still requiring supplemental oxygenation ? ?Disposition: Admit ? ?  ?FINAL CLINICAL IMPRESSION(S) / ED DIAGNOSES  ? ?Final diagnoses:  ?SOB (shortness of breath)  ?COPD exacerbation ([place])  ?Acute respiratory failure with hypoxia ([place])  ? ?Rx / DC Orders  ? ?ED Discharge Orders    ? ? None  ? ?  ? ?Note:  This document was prepared using Dragon voice recognition software and may include unintentional dictation errors. ?  ?Naaman Plummer, MD ?05/10/21 2033 ? ?

## 2021-05-10 NOTE — Assessment & Plan Note (Addendum)
Smokes a pack a day  ?Declines nicotine patch.  Plans to cut back after this episode of illness ?

## 2021-05-10 NOTE — Assessment & Plan Note (Signed)
Intermittent erythrocytosis on review over several years ? ?

## 2021-05-10 NOTE — ED Notes (Signed)
Nurse states floor is ready. ?

## 2021-05-10 NOTE — Assessment & Plan Note (Signed)
No acute issues suspected ?Last pulmonology follow-up in EHR was in 2018 ?

## 2021-05-11 DIAGNOSIS — J9601 Acute respiratory failure with hypoxia: Secondary | ICD-10-CM

## 2021-05-11 DIAGNOSIS — Z85118 Personal history of other malignant neoplasm of bronchus and lung: Secondary | ICD-10-CM | POA: Diagnosis not present

## 2021-05-11 DIAGNOSIS — J441 Chronic obstructive pulmonary disease with (acute) exacerbation: Secondary | ICD-10-CM | POA: Diagnosis not present

## 2021-05-11 DIAGNOSIS — F419 Anxiety disorder, unspecified: Secondary | ICD-10-CM | POA: Diagnosis not present

## 2021-05-11 DIAGNOSIS — F32A Depression, unspecified: Secondary | ICD-10-CM

## 2021-05-11 DIAGNOSIS — R69 Illness, unspecified: Secondary | ICD-10-CM | POA: Diagnosis not present

## 2021-05-11 LAB — HIV ANTIBODY (ROUTINE TESTING W REFLEX): HIV Screen 4th Generation wRfx: NONREACTIVE

## 2021-05-11 MED ORDER — IPRATROPIUM BROMIDE 0.02 % IN SOLN
0.5000 mg | Freq: Four times a day (QID) | RESPIRATORY_TRACT | Status: DC
  Administered 2021-05-11 – 2021-05-12 (×4): 0.5 mg via RESPIRATORY_TRACT
  Filled 2021-05-11 (×4): qty 2.5

## 2021-05-11 MED ORDER — LORATADINE 10 MG PO TABS
10.0000 mg | ORAL_TABLET | Freq: Every day | ORAL | Status: DC
  Filled 2021-05-11: qty 1

## 2021-05-11 MED ORDER — ALBUTEROL SULFATE (2.5 MG/3ML) 0.083% IN NEBU
2.5000 mg | INHALATION_SOLUTION | Freq: Four times a day (QID) | RESPIRATORY_TRACT | Status: DC
  Administered 2021-05-11 – 2021-05-12 (×3): 2.5 mg via RESPIRATORY_TRACT
  Filled 2021-05-11 (×4): qty 3

## 2021-05-11 NOTE — Progress Notes (Signed)
[place] ?Nurse reports patient reuquesting her daily zyrtec dose of 10 mg she takes at home (for preventing hives). Formulary substitute is claritin and it has been ordered ?

## 2021-05-11 NOTE — Progress Notes (Signed)
?  Progress Note ? ? ?Patient: Debra Mccann MVE:[id number] DOB: [birth year] DOA: 05/10/2021     0 ?DOS: the patient was seen and examined on 05/11/2021 ?  ?Brief hospital course: ?66 y.o. female with medical history significant for COPD, tobacco use disorder, non-small cell CA s/p RUL resection 2004, last seen by pulmonology December 2018 is admitted for COPD exacerbation ? ?3/17: Still has cough, wheezing and short of breath, hypoxic ? ? ?Assessment and Plan: ?* COPD with acute bronchitis ([place]) ?Schedule and as needed nebulized bronchodilator treatment ?Continue IV steroids and antibiotics: Doxycycline 100 mg IV twice daily ?Guaifenesin twice daily ?Supplemental oxygen to keep sats 90 to 92% she is weaned off to room air now. ? ?Acute respiratory failure with hypoxia ([place]) ?O2 sat 80% on room air with no prior history of home oxygen use.  Now she is weaned off to room air ?Suspect related to COPD  ? ? ?History of primary non-small cell carcinoma of right lung s/p RUL resection 2004 ?No acute issues suspected ?Last pulmonology follow-up in EHR was in 2018 ? ?Anxiety and depression ?Continue duloxetine  ? ?Erythrocytosis ?Intermittent erythrocytosis on review over several years ? ? ?Tobacco use disorder ?Smokes a pack a day  ?Declines nicotine patch.  Plans to cut back after this episode of illness ? ? ? ? ?  ? ?Subjective: Coughing, short of breath and wheezing ? ?Physical Exam: ?Vitals:  ? 05/11/21 0501 05/11/21 0746 05/11/21 0805 05/11/21 1314  ?BP: (!) 125/59 127/68    ?Pulse: 80 71  80  ?Resp: 20 16  20   ?Temp: 97.7 ?F (36.5 ?C) 98.4 ?F (36.9 ?C)    ?TempSrc:  Oral    ?SpO2: 96% 96% 93% 91%  ?Weight:      ?Height:      ? ?66 year old female sitting in the bed respiratory distress ?Eyes pupil equal round reactive to light and accommodation, no scleral icterus ?Lungs expiratory wheezing throughout both lungs.  No rhonchi or rales ?Abdomen soft, benign ?Neuro alert and oriented, nonfocal ?No rash or lesion ?Psych  normal mood and affect ? ?Data Reviewed: ? ?There are no new results to review at this time. ? ?Family Communication: None at bedside ? ?Disposition: ?Status is: Inpatient ?Remains inpatient appropriate because: COPD exacerbation treatment ? ? Planned Discharge Destination: Home ? ? ? DVT prophylaxis-Lovenox ? ?Time spent: 35 minutes ? ?Author: ?Max Sane, MD ?05/11/2021 1:44 PM ? ?For on call review [email or web address].  ?

## 2021-05-11 NOTE — Hospital Course (Signed)
66 y.o. female with medical history significant for COPD, tobacco use disorder, non-small cell CA s/p RUL resection 2004, last seen by pulmonology December 2018 is admitted for COPD exacerbation ? ?3/17: Still has cough, wheezing and short of breath, hypoxic ?

## 2021-05-11 NOTE — Plan of Care (Signed)
?  Problem: Education: ?Goal: Knowledge of the prescribed therapeutic regimen will improve ?Outcome: Completed/Met ?  ?Problem: Education: ?Goal: Individualized Educational Video(s) ?Outcome: Completed/Met ?  ?Problem: Activity: ?Goal: Will verbalize the importance of balancing activity with adequate rest periods ?Outcome: Completed/Met ?  ?Problem: Activity: ?Goal: Will verbalize the importance of balancing activity with adequate rest periods ?Outcome: Completed/Met ?  ?Problem: Respiratory: ?Goal: Ability to maintain a clear airway will improve ?Outcome: Completed/Met ?  ?Problem: Respiratory: ?Goal: Levels of oxygenation will improve ?Outcome: Completed/Met ?  ?Problem: Education: ?Goal: Knowledge of General Education information will improve ?Description: Including pain rating scale, medication(s)/side effects and non-pharmacologic comfort measures ?Outcome: Completed/Met ?  ?Problem: Health Behavior/Discharge Planning: ?Goal: Ability to manage health-related needs will improve ?Outcome: Completed/Met ?  ?Problem: Clinical Measurements: ?Goal: Ability to maintain clinical measurements within normal limits will improve ?Outcome: Completed/Met ?  ?Problem: Clinical Measurements: ?Goal: Ability to maintain clinical measurements within normal limits will improve ?Outcome: Completed/Met ?  ?Problem: Clinical Measurements: ?Goal: Will remain free from infection ?Outcome: Completed/Met ?  ?Problem: Clinical Measurements: ?Goal: Diagnostic test results will improve ?Outcome: Completed/Met ?  ?Problem: Clinical Measurements: ?Goal: Respiratory complications will improve ?Outcome: Completed/Met ?  ?Problem: Activity: ?Goal: Risk for activity intolerance will decrease ?Outcome: Completed/Met ?  ?Problem: Nutrition: ?Goal: Adequate nutrition will be maintained ?Outcome: Completed/Met ?  ?Problem: Coping: ?Goal: Level of anxiety will decrease ?Outcome: Completed/Met ?  ?Problem: Elimination: ?Goal: Will not experience  complications related to bowel motility ?Outcome: Completed/Met ?  ?Problem: Elimination: ?Goal: Will not experience complications related to urinary retention ?Outcome: Completed/Met ?  ?Problem: Pain Managment: ?Goal: General experience of comfort will improve ?Outcome: Completed/Met ?  ?Problem: Safety: ?Goal: Ability to remain free from injury will improve ?Outcome: Completed/Met ?  ?Problem: Skin Integrity: ?Goal: Risk for impaired skin integrity will decrease ?Outcome: Completed/Met ?  ?

## 2021-05-12 DIAGNOSIS — R0602 Shortness of breath: Principal | ICD-10-CM

## 2021-05-12 DIAGNOSIS — J9601 Acute respiratory failure with hypoxia: Secondary | ICD-10-CM | POA: Diagnosis not present

## 2021-05-12 DIAGNOSIS — J441 Chronic obstructive pulmonary disease with (acute) exacerbation: Secondary | ICD-10-CM | POA: Diagnosis not present

## 2021-05-12 DIAGNOSIS — R69 Illness, unspecified: Secondary | ICD-10-CM | POA: Diagnosis not present

## 2021-05-12 DIAGNOSIS — F419 Anxiety disorder, unspecified: Secondary | ICD-10-CM | POA: Diagnosis not present

## 2021-05-12 MED ORDER — GUAIFENESIN ER 600 MG PO TB12: 600.0000 mg | ORAL_TABLET | Freq: Two times a day (BID) | ORAL | 0 refills | Status: AC

## 2021-05-12 MED ORDER — DOXYCYCLINE HYCLATE 100 MG PO TABS: 100.0000 mg | ORAL_TABLET | Freq: Two times a day (BID) | ORAL | 0 refills | Status: AC

## 2021-05-12 MED ORDER — IPRATROPIUM-ALBUTEROL 0.5-2.5 (3) MG/3ML IN SOLN
3.0000 mL | Freq: Four times a day (QID) | RESPIRATORY_TRACT | Status: DC
  Administered 2021-05-12: 3 mL via RESPIRATORY_TRACT
  Filled 2021-05-12: qty 3

## 2021-05-12 MED ORDER — PREDNISONE 10 MG (21) PO TBPK: ORAL_TABLET | ORAL | 0 refills | Status: DC

## 2021-05-12 NOTE — TOC Transition Note (Addendum)
Transition of Care (TOC) - CM/SW Discharge Note ? ? ?Patient Details  ?Name: Debra Mccann ?MRN: [id number] ?Date of Birth: 06/26/1955 ? ?Transition of Care (TOC) CM/SW Contact:  ?Izola Price, RN ?Phone Number: ?05/12/2021, 9:08 AM ? ? ?Clinical Narrative: 3/18: Patient discharging today, orders in. No post acute care needs identified. Unit RN indicates patient has transportation home. RN CM spoke to patient and confirmed husband will transport home and is on his way, [facility], she will contact PCP on Monday [facility]), and she has no other concerns regarding her care or discharge plan. She stated to RN CM that she had excellent care all around from providers to nurses and inbetween. She stated if she rated her stay it would be a 10/10. Passed this information to provider and unit RN.  ?Simmie Davies RN CM  ? ?[id number] [address], [place] ?PCP Dr Louellen Molder ?No issues obtaining or paying for medication ?No transportation issues.  ? ?Simmie Davies RN CM [phone] ? ?Final next level of care: Home/Self Care ?Barriers to Discharge: Barriers Resolved ? ? ?Patient Goals and CMS Choice ?  ?  ?Choice offered to / list presented to : NA ? ?Discharge Placement ?  ?           ?  ?  ?  ?  ? ?Discharge Plan and Services ?  ?  ?           ?DME Arranged: N/A ?DME Agency: NA ?  ?  ?  ?HH Arranged: NA ?[place] Agency: NA ?  ?  ?  ? ?Social Determinants of Health (SDOH) Interventions ?  ? ? ?Readmission Risk Interventions ?No flowsheet data found. ? ? ? ? ?

## 2021-05-13 NOTE — Discharge Summary (Signed)
?Physician Discharge Summary ?  ?Patient: Debra Mccann MRN: [id number] DOB: Aug 24, 1955  ?Admit date:     05/10/2021  ?Discharge date: 05/12/2021  ?Discharge Physician: Max Sane  ? ?PCP: Danelle Berry, NP  ? ?Recommendations at discharge:  ? ?Follow-up with outpatient providers as requested ? ?Discharge Diagnoses: ?Principal Problem: ?  COPD with acute bronchitis ([place]) ?Active Problems: ?  Acute respiratory failure with hypoxia ([place]) ?  History of primary non-small cell carcinoma of right lung s/p RUL resection 2004 ?  Tobacco use disorder ?  Erythrocytosis ?  Anxiety and depression ?  COPD exacerbation ([place]) ?  SOB (shortness of breath) ? ? ?Hospital Course: ?66 y.o. female with medical history significant for COPD, tobacco use disorder, non-small cell CA s/p RUL resection 2004, last seen by pulmonology December 2018 is admitted for COPD exacerbation ? ?3/17: Still has cough, wheezing and short of breath, hypoxic ? ?Assessment and Plan: ?* COPD with acute bronchitis ([place]) ?Improved with steroids, nebulizer, antibiotics. ? ?Acute respiratory failure with hypoxia ([place]) ?Due to COPD exacerbation.  She initially required 2 L oxygen but now weaned off to room air and on day of discharge ? ?History of primary non-small cell carcinoma of right lung s/p RUL resection 2004 ?No acute issues suspected ?Last pulmonology follow-up in EHR was in 2018 ? ?Anxiety and depression ?Continue duloxetine  ? ?Erythrocytosis ?Intermittent erythrocytosis on review over several years ? ? ?Tobacco use disorder ?Smokes a pack a day  ?Declines nicotine patch.  Plans to cut back after this episode of illness ? ? ? ? ?  ? ? ?Disposition: Home ?Diet recommendation:  ?Discharge Diet Orders (From admission, onward)  ? ?  Start     Ordered  ? 05/12/21 0000  Diet - low sodium heart healthy       ? 05/12/21 0827  ? ?  ?  ? ?  ? ?Cardiac diet ?DISCHARGE MEDICATION: ?Allergies as of 05/12/2021   ? ?   Reactions  ? Penicillins   ? ?  ? ?   ?Medication List  ?  ? ?STOP taking these medications   ? ?ACETAMINOPHEN-BUTALBITAL 50-325 MG Tabs ?  ?aspirin EC 81 MG tablet ?  ?multivitamin with minerals Tabs tablet ?  ?PRO-BIOTIC BLEND PO ?  ?rosuvastatin 10 MG tablet ?Commonly known as: CRESTOR ?  ? ?  ? ?TAKE these medications   ? ?albuterol 108 (90 Base) MCG/ACT inhaler ?Commonly known as: VENTOLIN HFA ?Inhale 2 puffs into the lungs every 6 (six) hours as needed. ?  ?atorvastatin 40 MG tablet ?Commonly known as: LIPITOR ?TAKE 1 TABLET BY MOUTH EVERY NIGHT AT BEDTIME ?  ?cetirizine 10 MG tablet ?Commonly known as: ZYRTEC ?Take 10 mg by mouth daily. ?  ?Cholecalciferol 25 MCG (1000 UT) tablet ?Take by mouth. ?  ?docusate sodium 100 MG capsule ?Commonly known as: COLACE ?Take 100 mg by mouth daily. ?  ?doxycycline 100 MG tablet ?Commonly known as: VIBRA-TABS ?Take 1 tablet (100 mg total) by mouth every 12 (twelve) hours for 5 days. ?  ?DULoxetine 30 MG capsule ?Commonly known as: CYMBALTA ?Three pills by mouth daily ?  ?fluticasone 50 MCG/ACT nasal spray ?Commonly known as: FLONASE ?Place into the nose. ?  ?Fluticasone-Salmeterol 500-50 MCG/DOSE Aepb ?Commonly known as: Advair Diskus ?Inhale 1 puff into the lungs 2 (two) times daily. ?  ?guaiFENesin 600 MG 12 hr tablet ?Commonly known as: [place] ?Take 1 tablet (600 mg total) by mouth 2 (two) times daily for 7 days. ?  ?  ibuprofen 200 MG tablet ?Commonly known as: ADVIL ?Take 800 mg by mouth every 8 (eight) hours as needed. ?  ?omeprazole 40 MG capsule ?Commonly known as: PriLOSEC ?Take 1 capsule (40 mg total) by mouth daily. ?  ?predniSONE 10 MG (21) Tbpk tablet ?Commonly known as: STERAPRED UNI-PAK 21 TAB ?Start 60 mg po daily, taper 10 mg daily until finish ?  ?vitamin B-12 100 MCG tablet ?Commonly known as: CYANOCOBALAMIN ?Take 100 mcg by mouth daily. ?  ? ?  ? ? Follow-up Information   ? ? Danelle Berry, NP. Schedule an appointment as soon as possible for a visit in 1 week(s).   ?Specialty: Nurse  Practitioner ?Why: [facility] Discharge F/UP ?Contact information: ?[address] ?[place] [address] ?[phone] ? ? ?  ?  ? ?  ?  ? ?  ? ?Discharge Exam: ?Danley Danker Weights  ? 05/10/21 1803  ?Weight: 63.86 kg  ? ?66 year old female sitting in the bed respiratory distress ?Eyes pupil equal round reactive to light and accommodation, no scleral icterus ?Lungs expiratory wheezing throughout both lungs.  No rhonchi or rales ?Abdomen soft, benign ?Neuro alert and oriented, nonfocal ?No rash or lesion ?Psych normal mood and affect ? ?Condition at discharge: good ? ?The results of significant diagnostics from this hospitalization (including imaging, microbiology, ancillary and laboratory) are listed below for reference.  ? ?Imaging Studies: ?DG Chest 2 View ? ?Result Date: 05/10/2021 ?CLINICAL DATA:  Shortness of breath EXAM: CHEST - 2 VIEW COMPARISON:  03/15/2016. FINDINGS: Postoperative changes on the right. Scarring in the right apex and base. Left lung clear. No effusions. Heart is normal size. No acute bony abnormality. IMPRESSION: Postoperative changes on the right.  No active disease. Electronically Signed   By: Rolm Baptise M.D.   On: 05/10/2021 18:29   ? ?Microbiology: ?Results for orders placed or performed during the hospital encounter of 05/10/21  ?Resp Panel by RT-PCR (Flu A&B, Covid) Nasopharyngeal Swab     Status: None  ? Collection Time: 05/10/21  7:27 PM  ? Specimen: Nasopharyngeal Swab; Nasopharyngeal(NP) swabs in vial transport medium  ?Result Value Ref Range Status  ? SARS Coronavirus 2 by RT PCR NEGATIVE NEGATIVE Final  ?  Comment: (NOTE) ?SARS-CoV-2 target nucleic acids are NOT DETECTED. ? ?The SARS-CoV-2 RNA is generally detectable in upper respiratory ?specimens during the acute phase of infection. The lowest ?concentration of SARS-CoV-2 viral copies this assay can detect is ?138 copies/mL. A negative result does not preclude SARS-Cov-2 ?infection and should not be used as the sole basis for  treatment or ?other patient management decisions. A negative result may occur with  ?improper specimen collection/handling, submission of specimen other ?than nasopharyngeal swab, presence of viral mutation(s) within the ?areas targeted by this assay, and inadequate number of viral ?copies(<138 copies/mL). A negative result must be combined with ?clinical observations, patient history, and epidemiological ?information. The expected result is Negative. ? ?Fact Sheet for Patients:  ?EntrepreneurPulse.com.au ? ?Fact Sheet for Healthcare Providers:  ?IncredibleEmployment.be ? ?This test is no t yet approved or cleared by the [place] FDA and  ?has been authorized for detection and/or diagnosis of SARS-CoV-2 by ?FDA under an Emergency Use Authorization (EUA). This EUA will remain  ?in effect (meaning this test can be used) for the duration of the ?COVID-19 declaration under Section 564(b)(1) of the Act, 21 ?U.S.C.section 360bbb-3(b)(1), unless the authorization is terminated  ?or revoked sooner.  ? ? ?  ? Influenza A by PCR NEGATIVE NEGATIVE Final  ?  Influenza B by PCR NEGATIVE NEGATIVE Final  ?  Comment: (NOTE) ?The Xpert Xpress SARS-CoV-2/FLU/RSV plus assay is intended as an aid ?in the diagnosis of influenza from Nasopharyngeal swab specimens and ?should not be used as a sole basis for treatment. Nasal washings and ?aspirates are unacceptable for Xpert Xpress SARS-CoV-2/FLU/RSV ?testing. ? ?Fact Sheet for Patients: ?EntrepreneurPulse.com.au ? ?Fact Sheet for Healthcare Providers: ?IncredibleEmployment.be ? ?This test is not yet approved or cleared by the [place] FDA and ?has been authorized for detection and/or diagnosis of SARS-CoV-2 by ?FDA under an Emergency Use Authorization (EUA). This EUA will remain ?in effect (meaning this test can be used) for the duration of the ?COVID-19 declaration under Section 564(b)(1) of the Act, 21  U.S.C. ?section 360bbb-3(b)(1), unless the authorization is terminated or ?revoked. ? ?Performed at [facility], [place], ?[place] [address] ?  ? ? ?Labs: ?CBC: ?Recent Labs  ?Lab 05/10/21 ?[place]

## 2021-05-21 DIAGNOSIS — E782 Mixed hyperlipidemia: Secondary | ICD-10-CM | POA: Diagnosis not present

## 2021-05-21 DIAGNOSIS — R69 Illness, unspecified: Secondary | ICD-10-CM | POA: Diagnosis not present

## 2021-05-21 DIAGNOSIS — J449 Chronic obstructive pulmonary disease, unspecified: Secondary | ICD-10-CM | POA: Diagnosis not present

## 2021-05-25 DIAGNOSIS — J449 Chronic obstructive pulmonary disease, unspecified: Secondary | ICD-10-CM | POA: Diagnosis not present

## 2021-05-25 DIAGNOSIS — R0602 Shortness of breath: Secondary | ICD-10-CM | POA: Diagnosis not present

## 2021-05-25 DIAGNOSIS — R69 Illness, unspecified: Secondary | ICD-10-CM | POA: Diagnosis not present

## 2021-06-22 MED ORDER — PROPOFOL 10 MG/ML IV BOLUS
INTRAVENOUS | Status: AC
  Filled 2021-06-22: qty 20

## 2021-06-22 MED ORDER — LIDOCAINE HCL (PF) 2 % IJ SOLN
INTRAMUSCULAR | Status: AC
  Filled 2021-06-22: qty 5

## 2021-08-27 DIAGNOSIS — E782 Mixed hyperlipidemia: Secondary | ICD-10-CM | POA: Diagnosis not present

## 2021-08-27 DIAGNOSIS — I10 Essential (primary) hypertension: Secondary | ICD-10-CM | POA: Diagnosis not present

## 2021-08-27 DIAGNOSIS — R7301 Impaired fasting glucose: Secondary | ICD-10-CM | POA: Diagnosis not present

## 2021-08-30 DIAGNOSIS — R87612 Low grade squamous intraepithelial lesion on cytologic smear of cervix (LGSIL): Secondary | ICD-10-CM | POA: Diagnosis not present

## 2021-08-30 DIAGNOSIS — R5383 Other fatigue: Secondary | ICD-10-CM | POA: Diagnosis not present

## 2021-08-30 DIAGNOSIS — I10 Essential (primary) hypertension: Secondary | ICD-10-CM | POA: Diagnosis not present

## 2021-08-30 DIAGNOSIS — Z0001 Encounter for general adult medical examination with abnormal findings: Secondary | ICD-10-CM | POA: Diagnosis not present

## 2021-08-30 DIAGNOSIS — K219 Gastro-esophageal reflux disease without esophagitis: Secondary | ICD-10-CM | POA: Diagnosis not present

## 2021-08-30 DIAGNOSIS — E782 Mixed hyperlipidemia: Secondary | ICD-10-CM | POA: Diagnosis not present

## 2021-08-30 DIAGNOSIS — J449 Chronic obstructive pulmonary disease, unspecified: Secondary | ICD-10-CM | POA: Diagnosis not present

## 2021-10-16 DIAGNOSIS — R69 Illness, unspecified: Secondary | ICD-10-CM | POA: Diagnosis not present

## 2021-10-16 DIAGNOSIS — K449 Diaphragmatic hernia without obstruction or gangrene: Secondary | ICD-10-CM | POA: Diagnosis not present

## 2021-12-01 DIAGNOSIS — J441 Chronic obstructive pulmonary disease with (acute) exacerbation: Secondary | ICD-10-CM | POA: Diagnosis not present

## 2021-12-19 ENCOUNTER — Emergency Department
Admission: EM | Admit: 2021-12-19 | Discharge: 2021-12-19 | Discharge status: Against Medical Advice | Payer: 59 | Attending: Student in an Organized Health Care Education/Training Program | Admitting: Student in an Organized Health Care Education/Training Program

## 2021-12-19 DIAGNOSIS — Z5321 Procedure and treatment not carried out due to patient leaving prior to being seen by health care provider: Secondary | ICD-10-CM | POA: Diagnosis not present

## 2021-12-19 DIAGNOSIS — R1110 Vomiting, unspecified: Secondary | ICD-10-CM | POA: Insufficient documentation

## 2021-12-19 LAB — URINALYSIS, ROUTINE W REFLEX MICROSCOPIC
Bacteria, UA: NONE SEEN
Bilirubin Urine: NEGATIVE
Glucose, UA: NEGATIVE mg/dL
Hgb urine dipstick: NEGATIVE
Ketones, ur: 5 mg/dL — AB
Leukocytes,Ua: NEGATIVE
Nitrite: NEGATIVE
Protein, ur: 30 mg/dL — AB
Specific Gravity, Urine: 1.025 (ref 1.005–1.030)
pH: 5 (ref 5.0–8.0)

## 2021-12-19 LAB — CBC
HCT: 46.9 % — ABNORMAL HIGH (ref 36.0–46.0)
Hemoglobin: 15 g/dL (ref 12.0–15.0)
MCH: 28.5 pg (ref 26.0–34.0)
MCHC: 32 g/dL (ref 30.0–36.0)
MCV: 89.2 fL (ref 80.0–100.0)
Platelets: 274 10*3/uL (ref 150–400)
RBC: 5.26 MIL/uL — ABNORMAL HIGH (ref 3.87–5.11)
RDW: 14.3 % (ref 11.5–15.5)
WBC: 10.3 10*3/uL (ref 4.0–10.5)
nRBC: 0 % (ref 0.0–0.2)

## 2021-12-19 LAB — COMPREHENSIVE METABOLIC PANEL
ALT: 13 U/L (ref 0–44)
AST: 14 U/L — ABNORMAL LOW (ref 15–41)
Albumin: 4 g/dL (ref 3.5–5.0)
Alkaline Phosphatase: 47 U/L (ref 38–126)
Anion gap: 5 (ref 5–15)
BUN: 15 mg/dL (ref 8–23)
CO2: 27 mmol/L (ref 22–32)
Calcium: 8.9 mg/dL (ref 8.9–10.3)
Chloride: 104 mmol/L (ref 98–111)
Creatinine, Ser: 0.79 mg/dL (ref 0.44–1.00)
GFR, Estimated: >60 mL/min (ref >60)
Glucose, Bld: 92 mg/dL (ref 70–99)
Potassium: 3.8 mmol/L (ref 3.5–5.1)
Sodium: 136 mmol/L (ref 135–145)
Total Bilirubin: 0.9 mg/dL (ref 0.3–1.2)
Total Protein: 6.8 g/dL (ref 6.5–8.1)

## 2021-12-19 LAB — LIPASE, BLOOD: Lipase: 45 U/L (ref 11–51)

## 2021-12-19 NOTE — ED Triage Notes (Signed)
C/O pain at ventral hernia site.  C/O vomiting.  Sent from [facility] walk in for ED evaluation.  aAOx3.  Skin warm and dry. NAD

## 2021-12-19 NOTE — ED Triage Notes (Signed)
Pt c/o upper abdominal hernia x1 month but is now causing her pain.

## 2021-12-20 ENCOUNTER — Other Ambulatory Visit: Payer: Self-pay

## 2021-12-20 ENCOUNTER — Telehealth: Payer: Self-pay | Admitting: Emergency Medicine

## 2021-12-20 ENCOUNTER — Encounter: Payer: Self-pay | Admitting: Emergency Medicine

## 2021-12-20 ENCOUNTER — Emergency Department
Admission: EM | Admit: 2021-12-20 | Discharge: 2021-12-20 | Disposition: A | Discharge status: Home / Self Care | Payer: 59 | Attending: Emergency Medicine | Admitting: Emergency Medicine

## 2021-12-20 DIAGNOSIS — J449 Chronic obstructive pulmonary disease, unspecified: Secondary | ICD-10-CM | POA: Insufficient documentation

## 2021-12-20 DIAGNOSIS — R109 Unspecified abdominal pain: Secondary | ICD-10-CM | POA: Diagnosis not present

## 2021-12-20 DIAGNOSIS — K439 Ventral hernia without obstruction or gangrene: Secondary | ICD-10-CM | POA: Insufficient documentation

## 2021-12-20 LAB — CBC
HCT: 47 % — ABNORMAL HIGH (ref 36.0–46.0)
Hemoglobin: 15.2 g/dL — ABNORMAL HIGH (ref 12.0–15.0)
MCH: 28.1 pg (ref 26.0–34.0)
MCHC: 32.3 g/dL (ref 30.0–36.0)
MCV: 87 fL (ref 80.0–100.0)
Platelets: 267 10*3/uL (ref 150–400)
RBC: 5.4 MIL/uL — ABNORMAL HIGH (ref 3.87–5.11)
RDW: 14.2 % (ref 11.5–15.5)
WBC: 8.7 10*3/uL (ref 4.0–10.5)
nRBC: 0 % (ref 0.0–0.2)

## 2021-12-20 LAB — COMPREHENSIVE METABOLIC PANEL
ALT: 15 U/L (ref 0–44)
AST: 17 U/L (ref 15–41)
Albumin: 4 g/dL (ref 3.5–5.0)
Alkaline Phosphatase: 45 U/L (ref 38–126)
Anion gap: 8 (ref 5–15)
BUN: 10 mg/dL (ref 8–23)
CO2: 24 mmol/L (ref 22–32)
Calcium: 9 mg/dL (ref 8.9–10.3)
Chloride: 105 mmol/L (ref 98–111)
Creatinine, Ser: 0.84 mg/dL (ref 0.44–1.00)
GFR, Estimated: >60 mL/min (ref >60)
Glucose, Bld: 124 mg/dL — ABNORMAL HIGH (ref 70–99)
Potassium: 3.7 mmol/L (ref 3.5–5.1)
Sodium: 137 mmol/L (ref 135–145)
Total Bilirubin: 1.2 mg/dL (ref 0.3–1.2)
Total Protein: 6.6 g/dL (ref 6.5–8.1)

## 2021-12-20 LAB — LIPASE, BLOOD: Lipase: 43 U/L (ref 11–51)

## 2021-12-20 MED ORDER — TRAMADOL HCL 50 MG PO TABS: 50.0000 mg | ORAL_TABLET | Freq: Four times a day (QID) | ORAL | 0 refills | Status: DC | PRN

## 2021-12-20 NOTE — Telephone Encounter (Signed)
Called patient due to left emergency department before provider exam to inquire about condition and follow up plans. Left message. 

## 2021-12-20 NOTE — ED Triage Notes (Signed)
Patient to ED via POV for abd pain. Was sent here from [facility] yesterday for same but left before being seen. Upper abd pain for past 3 days was told by [facility] it was a hernia.

## 2021-12-20 NOTE — ED Provider Notes (Signed)
[facility] Provider Note    Event Date/Time   First MD Initiated Contact with Patient 12/20/21 1032     (approximate)   History   Abdominal Pain   HPI  THURSA EMME is a 66 y.o. female with history of COPD who presents with complaints of abdominal pain.  Patient reports over the last 2 months she has had a bulging in her upper abdomen.  Sometimes it swells after she eats.  She saw her PCP who told her it was a hiatal hernia.  Over the last couple of days she reports it has been burning intermittently.  This morning she took an analgesic and her pain is resolved.  She denies vomiting.  Currently drinking [employer] coffee.     Physical Exam   Triage Vital Signs: ED Triage Vitals [12/20/21 1016]  Enc Vitals Group     BP 136/77     Pulse Rate 95     Resp 18     Temp 98 F (36.7 C)     Temp Source Oral     SpO2 95 %     Weight 72.6 kg (160 lb)     Height 1.651 m (5\' 5" )     Head Circumference      Peak Flow      Pain Score 4     Pain Loc      Pain Edu?      Excl. in [place]?     Most recent vital signs: Vitals:   12/20/21 1016 12/20/21 1100  BP: 136/77 132/60  Pulse: 95 86  Resp: 18 19  Temp: 98 F (36.7 C) 98 F (36.7 C)  SpO2: 95% 95%     General: Awake, no distress.  CV:  Good peripheral perfusion.  Resp:  Normal effort.  Abd:  No distention.  Small ventral hernia noted, minimal tenderness to palpation, no swelling, no redness Other:     ED Results / Procedures / Treatments   Labs (all labs ordered are listed, but only abnormal results are displayed) Labs Reviewed  COMPREHENSIVE METABOLIC PANEL - Abnormal; Notable for the following components:      Result Value   Glucose, Bld 124 (*)    All other components within normal limits  CBC - Abnormal; Notable for the following components:   RBC 5.40 (*)    Hemoglobin 15.2 (*)    HCT 47.0 (*)    All other components within normal limits  LIPASE, BLOOD  URINALYSIS, ROUTINE W  REFLEX MICROSCOPIC     EKG  ED ECG REPORT I, Lavonia Drafts, the attending physician, personally viewed and interpreted this ECG.  Date: 12/19/2021  Rhythm: normal sinus rhythm QRS Axis: normal Intervals: normal ST/T Wave abnormalities: normal Narrative Interpretation: no evidence of acute ischemia    RADIOLOGY None    PROCEDURES:  Critical Care performed:   Procedures   MEDICATIONS ORDERED IN ED: Medications - No data to display   IMPRESSION / MDM / [place] / ED COURSE  I reviewed the triage vital signs and the nursing notes. Patient's presentation is most consistent with acute presentation with potential threat to life or bodily function.  Patient presents with reports of painful hernia.  Differential includes ventral hernia, strangulated, incarcerated hernia  On exam hernia is not significantly tender nor bulging.  It is palpable in the upper abdomen.  I am unable to reduce it however exam is not consistent with strangulated or incarcerated hernia.  No indication  for imaging.  Her lab work is unremarkable.  She is pain-free here in the emergency department.  I discussed with her the need for follow-up with general surgery for elective repair, return precautions to the ED if symptoms of strangulated/incarcerated hernia.        FINAL CLINICAL IMPRESSION(S) / ED DIAGNOSES   Final diagnoses:  Ventral hernia without obstruction or gangrene     Rx / DC Orders   ED Discharge Orders          Ordered    traMADol (ULTRAM) 50 MG tablet  Every 6 hours PRN        12/20/21 1100             Note:  This document was prepared using Dragon voice recognition software and may include unintentional dictation errors.   Lavonia Drafts, MD 12/20/21 1226

## 2021-12-24 ENCOUNTER — Encounter: Payer: Self-pay | Admitting: Surgery

## 2021-12-24 ENCOUNTER — Telehealth: Payer: Self-pay | Admitting: Surgery

## 2021-12-24 ENCOUNTER — Other Ambulatory Visit: Payer: Self-pay

## 2021-12-24 ENCOUNTER — Ambulatory Visit: Payer: 59 | Admitting: Surgery

## 2021-12-24 VITALS — BP 114/74 | HR 92 | Temp 98.2°F | Ht 65.0 in | Wt 156.0 lb

## 2021-12-24 DIAGNOSIS — K436 Other and unspecified ventral hernia with obstruction, without gangrene: Secondary | ICD-10-CM | POA: Diagnosis not present

## 2021-12-24 NOTE — Progress Notes (Signed)
Patient ID: Debra Mccann, female   DOB: 02/29/1955, 66 y.o.   MRN: [id number]  HPI Debra Mccann is a 66 y.o. female consultation at the request of Dr. Corky Downs for a ventral hernia.  She reports that she started hurting 1 weeks ago.  He has noticed a bulge about 2 months ago but more recently started having severe pain that is sharp intermittent and worsening with Valsalva.  Came to the ER 4 days ago and was given tramadol.  Tramadol is not working.  No images were done at that time.  Labs to include CBC and CMP were nml. The did have a history of prior lung cancer and also thoracotomy and lobectomy done 19 years ago at [facility].  She also had a history of laparoscopic cholecystectomy.  She did have a CT scan from 2017 that have personally reviewed showing evidence of an epigastric hernia measuring 3 cm.  This was not called by the radiologist. She Does have emphysema and uses inhalers.  She does have dyspnea on exertion. Continues to smoke a pack a day  HPI  Past Medical History:  Diagnosis Date   Asthma    Bronchitis    Cancer ([place])    lung cancer   Cervical high risk HPV (human papillomavirus) test positive 10/24/2016   COPD (chronic obstructive pulmonary disease) (HCC)    Depression    HPV (human papilloma virus) infection 10/22/2016   Evaluated by GYN [facility]   LGSIL on Pap smear of cervix 10/24/2016   With HPV(+) and mild dysplasia   Lung cancer ([place]) 2004   Lung cancer ([place])    OA (osteoarthritis) of hip    right   Osteopenia    Personal history of chemotherapy    Urticaria     Past Surgical History:  Procedure Laterality Date   ABDOMINAL HYSTERECTOMY  1997   partial, only ovaries remain; due to heavy bleeding   CHOLECYSTECTOMY  1999   COLONOSCOPY WITH PROPOFOL N/A 05/19/2017   Procedure: COLONOSCOPY WITH PROPOFOL;  Surgeon: Jonathon Bellows, MD;  Location: [facility] ENDOSCOPY;  Service: Gastroenterology;  Laterality: N/A;   LUNG CANCER SURGERY Right 2004   removed "top  portion of right lung"    Family History  Problem Relation Age of Onset   CAD Mother        with stents   Heart disease Mother    Hypertension Mother    Heart disease Father        3 vessel CABG   Cancer Father        melanoma   Hypertension Father    Gout Father    Depression Daughter    Alzheimer's disease Maternal Grandmother    Breast cancer Cousin    Diabetes Neg Hx    Stroke Neg Hx    COPD Neg Hx     Social History Social History   Tobacco Use   Smoking status: Every Day    Packs/day: 0.50    Years: 23.00    Total pack years: 11.50    Types: Cigarettes   Smokeless tobacco: Never  Vaping Use   Vaping Use: Never used  Substance Use Topics   Alcohol use: No    Alcohol/week: 0.0 standard drinks of alcohol   Drug use: No    Allergies  Allergen Reactions   Penicillins     Current Outpatient Medications  Medication Sig Dispense Refill   albuterol (VENTOLIN HFA) 108 (90 Base) MCG/ACT inhaler Inhale 2 puffs into the  lungs every 6 (six) hours as needed.      atorvastatin (LIPITOR) 40 MG tablet TAKE 1 TABLET BY MOUTH EVERY NIGHT AT BEDTIME 7 tablet 0   cetirizine (ZYRTEC) 10 MG tablet Take 10 mg by mouth daily.     DULoxetine (CYMBALTA) 30 MG capsule Three pills by mouth daily 90 capsule 1   traMADol (ULTRAM) 50 MG tablet Take 1 tablet (50 mg total) by mouth every 6 (six) hours as needed. 20 tablet 0   vitamin B-12 (CYANOCOBALAMIN) 100 MCG tablet Take 100 mcg by mouth daily.     omeprazole (PRILOSEC) 40 MG capsule Take 1 capsule (40 mg total) by mouth daily. 30 capsule 0   No current facility-administered medications for this visit.     Review of Systems Full ROS  was asked and was negative except for the information on the HPI  Physical Exam Blood pressure 114/74, pulse 92, temperature 98.2 F (36.8 C), temperature source Oral, height 5\' 5"  (1.651 m), weight 156 lb (70.8 kg), SpO2 96 %. CONSTITUTIONAL: NAD. EYES: Pupils are equal, round,  Sclera are  non-icteric. EARS, NOSE, MOUTH AND THROAT: The oral mucosa is pink and moist. Hearing is intact to voice. LYMPH NODES:  Lymph nodes in the neck are normal. RESPIRATORY: She does have expiratory wheezes. There is normal respiratory effort, with equal breath sounds bilaterally, and without pathologic use of accessory muscles. CARDIOVASCULAR: Heart is regular without murmurs, gallops, or rubs. GI: The abdomen is  soft,tender with epigastric hernia incarcerated 3 cms   There is no hepatosplenomegaly. There are normal bowel sounds in all quadrants. GU: Rectal deferred.   MUSCULOSKELETAL: Normal muscle strength and tone. No cyanosis or edema.   SKIN: Turgor is good and there are no pathologic skin lesions or ulcers. NEUROLOGIC: Motor and sensation is grossly normal. Cranial nerves are grossly intact. PSYCH:  Oriented to person, place and time. Affect is normal.  Data Reviewed  I have personally reviewed the patient's imaging, laboratory findings and medical records.    Assessment/Plan 66 year old female with COPD who active smoker and incarcerated ventral hernia.  Options of the patient was given to include admission CT scan stat versus urgent operation tomorrow were discussed with him. Given that she had a prior cholecystectomy and prior hernia on the CT scan of the chest I feel fairly confident that this is an incarcerated ventral hernia.  I do not think that imaging modalities will change management.  She does need an urgent ventral hernia repair.  Procedure discussed with the patient in detail.  Risk, benefits and possible implications including but not limited to: Bleeding, infection, recurrence, mesh issues, chronic pain.  She understands and wished to proceed. Copy of this report was sent to the referring provider Please note that I spent greater than 60 minutes in this encounter including personally reviewing images studies, coordinating care, placing orders and performing appropriate  documentation   Caroleen Hamman, MD FACS General Surgeon 12/24/2021, 3:30 PM

## 2021-12-24 NOTE — Patient Instructions (Addendum)
Ventral Hernia  A ventral hernia is a bulge of tissue from inside the abdomen that pushes through a weak area of the muscles that form the front wall of the abdomen. The tissues inside the abdomen are inside a sac (peritoneum). These tissues include the small intestine, large intestine, and the fatty tissue that covers the intestines (omentum). Sometimes, the bulge that forms a hernia contains intestines. Other hernias contain only fat. Ventral hernias do not go away without surgical treatment. There are several types of ventral hernias. You may have: A hernia at an incision site from previous abdominal surgery (incisional hernia). A hernia just above the belly button (epigastric hernia), or at the belly button (umbilical hernia). These types of hernias can develop from heavy lifting or straining. A hernia that comes and goes (reducible hernia). It may be visible only when you lift or strain. This type of hernia can be pushed back into the abdomen (reduced). A hernia that traps abdominal tissue inside the hernia (incarcerated hernia). This type of hernia does not reduce. A hernia that cuts off blood flow to the tissues inside the hernia (strangulated hernia). The tissues can start to die if this happens. This is a very painful bulge that cannot be reduced. A strangulated hernia is a medical emergency. What are the causes? This condition is caused by abdominal tissue putting pressure on an area of weakness in the abdominal muscles. What increases the risk? The following factors may make you more likely to develop this condition: Being age 60 or older. Being overweight or obese. Having had previous abdominal surgery, especially if there was an infection after surgery. Having had an injury to the abdominal wall. Frequently lifting or pushing heavy objects. Having had several pregnancies. Having a buildup of fluid inside the abdomen (ascites). Straining to have a bowel movement or to  urinate. Having frequent coughing episodes. What are the signs or symptoms? The only symptom of a ventral hernia may be a painless bulge in the abdomen. A reducible hernia may be visible only when you strain, cough, or lift. Other symptoms may include: Dull pain. A feeling of pressure. Signs and symptoms of a strangulated hernia may include: Increasing pain. Nausea and vomiting. Pain when pressing on the hernia. The skin over the hernia turning red or purple. Constipation. Blood in the stool (feces). How is this diagnosed? This condition may be diagnosed based on: Your symptoms. Your medical history. A physical exam. You may be asked to cough or strain while standing. These actions increase the pressure inside your abdomen and force the hernia through the opening in your muscles. Your health care provider may try to reduce the hernia by gently pushing the hernia back in. Imaging studies, such as an ultrasound or CT scan. How is this treated? This condition is treated with surgery. If you have a strangulated hernia, surgery is done as soon as possible. If your hernia is small and not incarcerated, you may be asked to lose some weight before surgery. Follow these instructions at home: Follow instructions from your health care provider about eating or drinking restrictions. If you are overweight, your health care provider may recommend that you increase your activity level and eat a healthier diet. Do not lift anything that is heavier than 10 lb (4.5 kg), or the limit that you are told, until your health care provider says that it is safe. Return to your normal activities as told by your health care provider. Ask your health care provider  what activities are safe for you. You may need to avoid activities that increase pressure on your hernia. Take over-the-counter and prescription medicines only as told by your health care provider. Keep all follow-up visits. This is important. Contact a  health care provider if: Your hernia gets larger. Your hernia becomes painful. Get help right away if: Your hernia becomes increasingly painful. You have pain along with any of the following: Changes in skin color in the area of the hernia. Nausea. Vomiting. Fever. These symptoms may represent a serious problem that is an emergency. Do not wait to see if the symptoms will go away. Get medical help right away. Call your local emergency services (911 in the [place] Do not drive yourself to the hospital. Summary A ventral hernia is a bulge of tissue from inside the abdomen that pushes through a weak area of the muscles that form the front wall of the abdomen. This condition is treated with surgery, which may be urgent depending on your hernia. Do not lift anything that is heavier than 10 lb (4.5 kg), and follow activity instructions from your health care provider. This information is not intended to replace advice given to you by your health care provider. Make sure you discuss any questions you have with your health care provider. Document Revised: 10/01/2019 Document Reviewed: 10/01/2019 Elsevier Patient Education  [place]. Laparoscopic Ventral Hernia Repair Laparoscopic ventral hernia repairis a procedure to fix a bulge of tissue that pushes through a weak area of muscle in the abdomen (ventral hernia). A ventral hernia may be: Above the belly button. This is called an epigastric hernia. At the belly button. This is called an umbilical hernia. At the incision site from previous abdominal surgery. This is called an incisional hernia. You may have this procedure as emergency surgery if part of your intestine gets trapped inside the hernia and starts to lose its blood supply (strangulation). Laparoscopic surgery is done through small incisions using a thin surgical telescope with a light and camera (laparoscope). During surgery, your surgeon will use images from the laparoscope to  guide the procedure. A mesh screen will be placed in the hernia to close the opening and strengthen the abdominal wall. Tell a health care provider about: Any allergies you have. All medicines you are taking, including vitamins, herbs, eye drops, creams, and over-the-counter medicines. Any problems you or family members have had with anesthetic medicines. Any blood disorders you have. Any surgeries you have had. Any medical conditions you have. Whether you are pregnant or may be pregnant. What are the risks? Generally, this is a safe procedure. However, problems may occur, including: Infection. Bleeding. Damage to nearby structures or organs in the abdomen. Trouble urinating or having a bowel movement after surgery. Blood clots. The hernia coming back after surgery. Fluid buildup in the area of the hernia. In some cases, your health care provider may need to switch from a laparoscopic procedure to a procedure that is done through a single, larger incision in the abdomen (open procedure). You may need an open procedure if: You have a hernia that is difficult to repair. Your organs are hard to see with the laparoscope. You have bleeding problems during the laparoscopic procedure. What happens before the procedure? Staying hydrated Follow instructions from your health care provider about hydration, which may include: Up to 2 hours before the procedure - you may continue to drink clear liquids, such as water, clear fruit juice, black coffee, and plain tea.  Eating  and drinking restrictions Follow instructions from your health care provider about eating and drinking, which may include: 8 hours before the procedure - stop eating heavy meals or foods, such as meat, fried foods, or fatty foods. 6 hours before the procedure - stop eating light meals or foods, such as toast or cereal. 6 hours before the procedure - stop drinking milk or drinks that contain milk. 2 hours before the procedure -  stop drinking clear liquids. Medicines Ask your health care provider about: Changing or stopping your regular medicines. This is especially important if you are taking diabetes medicines or blood thinners. Taking medicines such as aspirin and ibuprofen. These medicines can thin your blood. Do not take these medicines unless your health care provider tells you to take them. Taking over-the-counter medicines, vitamins, herbs, and supplements. Tests You may need to have tests before the procedure, such as: Blood tests. Urine tests. Abdominal ultrasound. Chest X-ray. Electrocardiogram (ECG). General instructions You may be asked to take a laxative or do an enema to empty your bowel before surgery (bowel prep). Do not use any products that contain nicotine or tobacco for at least 4 weeks before the procedure. These products include cigarettes, chewing tobacco, and vaping devices, such as e-cigarettes. If you need help quitting, ask your health care provider. Ask your health care provider: How your surgery site will be marked. What steps will be taken to help prevent infection. These steps may include: Removing hair at the surgery site. Washing skin with a germ-killing soap. Receiving antibiotic medicine. Plan to have a responsible adult take you home from the hospital or clinic. If you will be going home right after the procedure, plan to have a responsible adult care for you for the time you are told. This is important. What happens during the procedure?  An IV will be inserted into one of your veins. You will be given one or more of the following: A medicine to help you relax (sedative). A medicine to numb the area (local anesthetic). A medicine to make you fall asleep (general anesthetic). A small incision will be made in your abdomen. A hollow metal tube (trocar) will be placed through the incision. A tube will be placed through the trocar to inflate your abdomen with carbon dioxide.  This makes it easier for your surgeon to see inside your abdomen during the repair. A laparoscope will be inserted into your abdomen through the trocar. The laparoscope will send images to a monitor in the operating room. Other trocars will be put through other small incisions in your abdomen. The surgical instruments needed for the procedure will be placed through these trocars. The tissue or intestines that make up the hernia will be moved back into place. The edges of the hernia may be stitched (sutured) together. A piece of mesh will be used to close the hernia. Sutures, clips, or staples will be used to keep the mesh in place. A bandage (dressing) or skin glue will be put over the incisions. The procedure may vary among health care providers and hospitals. What happens after the procedure? Your blood pressure, heart rate, breathing rate, and blood oxygen level will be monitored until you leave the hospital or clinic. You will continue to receive fluids and medicines through an IV. Your IV will be removed when you can drink clear fluids. You will be given pain medicine as needed. You will be encouraged to get up and walk around as soon as possible. You will be shown  how to do deep breathing exercises to help prevent a lung infection. If you were given a sedative during the procedure, it can affect you for several hours. Do not drive or operate machinery until your health care provider says that it is safe. Summary Laparoscopic ventral hernia is an operation to fix a hernia using small incisions. Tell your health care provider about other medical conditions that you have and about all the medicines that you are taking. Follow instructions from your health care provider about eating and drinking before the procedure. Plan to have a responsible adult take you home from the hospital or clinic. After the procedure, you will be encouraged to walk as soon as possible. You will also be taught how to  do deep breathing exercises. This information is not intended to replace advice given to you by your health care provider. Make sure you discuss any questions you have with your health care provider. Document Revised: 10/01/2019 Document Reviewed: 10/01/2019 Elsevier Patient Education  [place].

## 2021-12-24 NOTE — H&P (View-Only) (Signed)
Patient ID: Debra Mccann, female   DOB: 1955-03-19, 66 y.o.   MRN: [id number]  HPI Debra Mccann is a 66 y.o. female consultation at the request of Dr. Corky Downs for a ventral hernia.  She reports that she started hurting 1 weeks ago.  He has noticed a bulge about 2 months ago but more recently started having severe pain that is sharp intermittent and worsening with Valsalva.  Came to the ER 4 days ago and was given tramadol.  Tramadol is not working.  No images were done at that time.  Labs to include CBC and CMP were nml. The did have a history of prior lung cancer and also thoracotomy and lobectomy done 19 years ago at [facility].  She also had a history of laparoscopic cholecystectomy.  She did have a CT scan from 2017 that have personally reviewed showing evidence of an epigastric hernia measuring 3 cm.  This was not called by the radiologist. She Does have emphysema and uses inhalers.  She does have dyspnea on exertion. Continues to smoke a pack a day  HPI  Past Medical History:  Diagnosis Date   Asthma    Bronchitis    Cancer ([place])    lung cancer   Cervical high risk HPV (human papillomavirus) test positive 10/24/2016   COPD (chronic obstructive pulmonary disease) (HCC)    Depression    HPV (human papilloma virus) infection 10/22/2016   Evaluated by GYN [facility]   LGSIL on Pap smear of cervix 10/24/2016   With HPV(+) and mild dysplasia   Lung cancer ([place]) 2004   Lung cancer ([place])    OA (osteoarthritis) of hip    right   Osteopenia    Personal history of chemotherapy    Urticaria     Past Surgical History:  Procedure Laterality Date   ABDOMINAL HYSTERECTOMY  1997   partial, only ovaries remain; due to heavy bleeding   CHOLECYSTECTOMY  1999   COLONOSCOPY WITH PROPOFOL N/A 05/19/2017   Procedure: COLONOSCOPY WITH PROPOFOL;  Surgeon: Jonathon Bellows, MD;  [facility]: [facility] ENDOSCOPY;  Service: Gastroenterology;  Laterality: N/A;   LUNG CANCER SURGERY Right 2004   removed "top  portion of right lung"    Family History  Problem Relation Age of Onset   CAD Mother        with stents   Heart disease Mother    Hypertension Mother    Heart disease Father        3 vessel CABG   Cancer Father        melanoma   Hypertension Father    Gout Father    Depression Daughter    Alzheimer's disease Maternal Grandmother    Breast cancer Cousin    Diabetes Neg Hx    Stroke Neg Hx    COPD Neg Hx     Social History Social History   Tobacco Use   Smoking status: Every Day    Packs/day: 0.50    Years: 23.00    Total pack years: 11.50    Types: Cigarettes   Smokeless tobacco: Never  Vaping Use   Vaping Use: Never used  Substance Use Topics   Alcohol use: No    Alcohol/week: 0.0 standard drinks of alcohol   Drug use: No    Allergies  Allergen Reactions   Penicillins     Current Outpatient Medications  Medication Sig Dispense Refill   albuterol (VENTOLIN HFA) 108 (90 Base) MCG/ACT inhaler Inhale 2 puffs into the  lungs every 6 (six) hours as needed.      atorvastatin (LIPITOR) 40 MG tablet TAKE 1 TABLET BY MOUTH EVERY NIGHT AT BEDTIME 7 tablet 0   cetirizine (ZYRTEC) 10 MG tablet Take 10 mg by mouth daily.     DULoxetine (CYMBALTA) 30 MG capsule Three pills by mouth daily 90 capsule 1   traMADol (ULTRAM) 50 MG tablet Take 1 tablet (50 mg total) by mouth every 6 (six) hours as needed. 20 tablet 0   vitamin B-12 (CYANOCOBALAMIN) 100 MCG tablet Take 100 mcg by mouth daily.     omeprazole (PRILOSEC) 40 MG capsule Take 1 capsule (40 mg total) by mouth daily. 30 capsule 0   No current facility-administered medications for this visit.     Review of Systems Full ROS  was asked and was negative except for the information on the HPI  Physical Exam Blood pressure 114/74, pulse 92, temperature 98.2 F (36.8 C), temperature source Oral, height 5\' 5"  (1.651 m), weight 156 lb (70.8 kg), SpO2 96 %. CONSTITUTIONAL: NAD. EYES: Pupils are equal, round,  Sclera are  non-icteric. EARS, NOSE, MOUTH AND THROAT: The oral mucosa is pink and moist. Hearing is intact to voice. LYMPH NODES:  Lymph nodes in the neck are normal. RESPIRATORY: She does have expiratory wheezes. There is normal respiratory effort, with equal breath sounds bilaterally, and without pathologic use of accessory muscles. CARDIOVASCULAR: Heart is regular without murmurs, gallops, or rubs. GI: The abdomen is  soft,tender with epigastric hernia incarcerated 3 cms   There is no hepatosplenomegaly. There are normal bowel sounds in all quadrants. GU: Rectal deferred.   MUSCULOSKELETAL: Normal muscle strength and tone. No cyanosis or edema.   SKIN: Turgor is good and there are no pathologic skin lesions or ulcers. NEUROLOGIC: Motor and sensation is grossly normal. Cranial nerves are grossly intact. PSYCH:  Oriented to person, place and time. Affect is normal.  Data Reviewed  I have personally reviewed the patient's imaging, laboratory findings and medical records.    Assessment/Plan 66 year old female with COPD who active smoker and incarcerated ventral hernia.  Options of the patient was given to include admission CT scan stat versus urgent operation tomorrow were discussed with him. Given that she had a prior cholecystectomy and prior hernia on the CT scan of the chest I feel fairly confident that this is an incarcerated ventral hernia.  I do not think that imaging modalities will change management.  She does need an urgent ventral hernia repair.  Procedure discussed with the patient in detail.  Risk, benefits and possible implications including but not limited to: Bleeding, infection, recurrence, mesh issues, chronic pain.  She understands and wished to proceed. Copy of this report was sent to the referring provider Please note that I spent greater than 60 minutes in this encounter including personally reviewing images studies, coordinating care, placing orders and performing appropriate  documentation   Caroleen Hamman, MD FACS General Surgeon 12/24/2021, 3:30 PM

## 2021-12-24 NOTE — Telephone Encounter (Signed)
Patient has been advised of Pre-Admission date/time, and Surgery date at [facility].  Surgery Date: 12/25/21 Preadmission Testing Date: 12/25/21 patient to arrive at 9:00 am at [facility] 2nd floor.  Patient has been called informed of her surgery , she is to be NPO after midnight.  Patient verbalized understanding.

## 2021-12-25 ENCOUNTER — Encounter
Admission: RE | Disposition: A | Discharge status: Home / Self Care | Payer: Self-pay | Source: Home / Self Care | Attending: Surgery

## 2021-12-25 ENCOUNTER — Ambulatory Visit: Payer: 59 | Admitting: Certified Registered"

## 2021-12-25 ENCOUNTER — Encounter: Payer: Self-pay | Admitting: Surgery

## 2021-12-25 ENCOUNTER — Ambulatory Visit
Admission: RE | Admit: 2021-12-25 | Discharge: 2021-12-25 | Disposition: A | Discharge status: Home / Self Care | Payer: 59 | Attending: Surgery | Admitting: Surgery

## 2021-12-25 ENCOUNTER — Other Ambulatory Visit: Payer: Self-pay

## 2021-12-25 DIAGNOSIS — F17210 Nicotine dependence, cigarettes, uncomplicated: Secondary | ICD-10-CM | POA: Diagnosis not present

## 2021-12-25 DIAGNOSIS — R69 Illness, unspecified: Secondary | ICD-10-CM | POA: Diagnosis not present

## 2021-12-25 DIAGNOSIS — K439 Ventral hernia without obstruction or gangrene: Secondary | ICD-10-CM | POA: Diagnosis not present

## 2021-12-25 DIAGNOSIS — F419 Anxiety disorder, unspecified: Secondary | ICD-10-CM | POA: Diagnosis not present

## 2021-12-25 DIAGNOSIS — F32A Depression, unspecified: Secondary | ICD-10-CM | POA: Diagnosis not present

## 2021-12-25 DIAGNOSIS — Z902 Acquired absence of lung [part of]: Secondary | ICD-10-CM | POA: Insufficient documentation

## 2021-12-25 DIAGNOSIS — K219 Gastro-esophageal reflux disease without esophagitis: Secondary | ICD-10-CM | POA: Diagnosis not present

## 2021-12-25 DIAGNOSIS — J449 Chronic obstructive pulmonary disease, unspecified: Secondary | ICD-10-CM | POA: Insufficient documentation

## 2021-12-25 DIAGNOSIS — K436 Other and unspecified ventral hernia with obstruction, without gangrene: Secondary | ICD-10-CM

## 2021-12-25 DIAGNOSIS — Z01818 Encounter for other preprocedural examination: Secondary | ICD-10-CM

## 2021-12-25 DIAGNOSIS — Z85118 Personal history of other malignant neoplasm of bronchus and lung: Secondary | ICD-10-CM | POA: Diagnosis not present

## 2021-12-25 DIAGNOSIS — Z87891 Personal history of nicotine dependence: Secondary | ICD-10-CM | POA: Diagnosis not present

## 2021-12-25 HISTORY — PX: INSERTION OF MESH: SHX5868

## 2021-12-25 HISTORY — PX: VENTRAL HERNIA REPAIR: SHX424

## 2021-12-25 SURGERY — REPAIR, HERNIA, VENTRAL
Anesthesia: General

## 2021-12-25 MED ORDER — FENTANYL CITRATE (PF) 100 MCG/2ML IJ SOLN
INTRAMUSCULAR | Status: DC | PRN
  Administered 2021-12-25: 25 ug via INTRAVENOUS

## 2021-12-25 MED ORDER — IPRATROPIUM-ALBUTEROL 0.5-2.5 (3) MG/3ML IN SOLN
RESPIRATORY_TRACT | Status: AC
  Administered 2021-12-25: 3 mL via RESPIRATORY_TRACT
  Filled 2021-12-25: qty 3

## 2021-12-25 MED ORDER — PHENYLEPHRINE 80 MCG/ML (10ML) SYRINGE FOR IV PUSH (FOR BLOOD PRESSURE SUPPORT)
PREFILLED_SYRINGE | INTRAVENOUS | Status: AC
  Filled 2021-12-25: qty 10

## 2021-12-25 MED ORDER — MIDAZOLAM HCL 2 MG/2ML IJ SOLN
INTRAMUSCULAR | Status: AC
  Filled 2021-12-25: qty 2

## 2021-12-25 MED ORDER — ROCURONIUM BROMIDE 100 MG/10ML IV SOLN
INTRAVENOUS | Status: DC | PRN
  Administered 2021-12-25: 50 mg via INTRAVENOUS

## 2021-12-25 MED ORDER — BUPIVACAINE-EPINEPHRINE (PF) 0.25% -1:200000 IJ SOLN
INTRAMUSCULAR | Status: AC
  Filled 2021-12-25: qty 30

## 2021-12-25 MED ORDER — HYDROCODONE-ACETAMINOPHEN 5-325 MG PO TABS: 1.0000 | ORAL_TABLET | Freq: Four times a day (QID) | ORAL | 0 refills | Status: DC | PRN

## 2021-12-25 MED ORDER — DEXAMETHASONE SODIUM PHOSPHATE 10 MG/ML IJ SOLN
INTRAMUSCULAR | Status: DC | PRN
  Administered 2021-12-25: 10 mg via INTRAVENOUS

## 2021-12-25 MED ORDER — DEXAMETHASONE SODIUM PHOSPHATE 10 MG/ML IJ SOLN
INTRAMUSCULAR | Status: AC
  Filled 2021-12-25: qty 1

## 2021-12-25 MED ORDER — ROCURONIUM BROMIDE 10 MG/ML (PF) SYRINGE
PREFILLED_SYRINGE | INTRAVENOUS | Status: AC
  Filled 2021-12-25: qty 10

## 2021-12-25 MED ORDER — LIDOCAINE HCL (PF) 2 % IJ SOLN
INTRAMUSCULAR | Status: AC
  Filled 2021-12-25: qty 5

## 2021-12-25 MED ORDER — ACETAMINOPHEN 500 MG PO TABS: 1000.0000 mg | ORAL_TABLET | ORAL | Status: AC

## 2021-12-25 MED ORDER — BUPIVACAINE-EPINEPHRINE (PF) 0.25% -1:200000 IJ SOLN
INTRAMUSCULAR | Status: DC | PRN
  Administered 2021-12-25: 30 mL

## 2021-12-25 MED ORDER — PROPOFOL 10 MG/ML IV BOLUS
INTRAVENOUS | Status: DC | PRN
  Administered 2021-12-25: 80 mg via INTRAVENOUS

## 2021-12-25 MED ORDER — BUPIVACAINE LIPOSOME 1.3 % IJ SUSP
INTRAMUSCULAR | Status: DC | PRN
  Administered 2021-12-25: 20 mL

## 2021-12-25 MED ORDER — GABAPENTIN 300 MG PO CAPS: 300.0000 mg | ORAL_CAPSULE | ORAL | Status: AC

## 2021-12-25 MED ORDER — PROPOFOL 10 MG/ML IV BOLUS
INTRAVENOUS | Status: AC
  Filled 2021-12-25: qty 20

## 2021-12-25 MED ORDER — CHLORHEXIDINE GLUCONATE 0.12 % MT SOLN
OROMUCOSAL | Status: AC
  Administered 2021-12-25: 15 mL via OROMUCOSAL
  Filled 2021-12-25: qty 15

## 2021-12-25 MED ORDER — BUPIVACAINE LIPOSOME 1.3 % IJ SUSP: 20.0000 mL | Freq: Once | INTRAMUSCULAR | Status: DC

## 2021-12-25 MED ORDER — BUPIVACAINE LIPOSOME 1.3 % IJ SUSP
INTRAMUSCULAR | Status: AC
  Filled 2021-12-25: qty 20

## 2021-12-25 MED ORDER — PHENYLEPHRINE 80 MCG/ML (10ML) SYRINGE FOR IV PUSH (FOR BLOOD PRESSURE SUPPORT)
PREFILLED_SYRINGE | INTRAVENOUS | Status: DC | PRN
  Administered 2021-12-25 (×3): 160 ug via INTRAVENOUS

## 2021-12-25 MED ORDER — GABAPENTIN 300 MG PO CAPS
ORAL_CAPSULE | ORAL | Status: AC
  Administered 2021-12-25: 300 mg via ORAL
  Filled 2021-12-25: qty 1

## 2021-12-25 MED ORDER — ACETAMINOPHEN 500 MG PO TABS
ORAL_TABLET | ORAL | Status: AC
  Administered 2021-12-25: 1000 mg via ORAL
  Filled 2021-12-25: qty 2

## 2021-12-25 MED ORDER — ONDANSETRON HCL 4 MG/2ML IJ SOLN: 4.0000 mg | Freq: Once | INTRAMUSCULAR | Status: DC | PRN

## 2021-12-25 MED ORDER — LACTATED RINGERS IV SOLN: INTRAVENOUS | Status: DC

## 2021-12-25 MED ORDER — ONDANSETRON HCL 4 MG/2ML IJ SOLN
INTRAMUSCULAR | Status: DC | PRN
  Administered 2021-12-25: 4 mg via INTRAVENOUS

## 2021-12-25 MED ORDER — LIDOCAINE HCL (CARDIAC) PF 100 MG/5ML IV SOSY
PREFILLED_SYRINGE | INTRAVENOUS | Status: DC | PRN
  Administered 2021-12-25: 100 mg via INTRAVENOUS

## 2021-12-25 MED ORDER — CEFAZOLIN SODIUM-DEXTROSE 2-4 GM/100ML-% IV SOLN
INTRAVENOUS | Status: AC
  Filled 2021-12-25: qty 100

## 2021-12-25 MED ORDER — CHLORHEXIDINE GLUCONATE CLOTH 2 % EX PADS: 6.0000 | MEDICATED_PAD | Freq: Once | CUTANEOUS | Status: DC

## 2021-12-25 MED ORDER — ORAL CARE MOUTH RINSE: 15.0000 mL | Freq: Once | OROMUCOSAL | Status: AC

## 2021-12-25 MED ORDER — IPRATROPIUM-ALBUTEROL 0.5-2.5 (3) MG/3ML IN SOLN: 3.0000 mL | Freq: Once | RESPIRATORY_TRACT | Status: AC

## 2021-12-25 MED ORDER — CHLORHEXIDINE GLUCONATE 0.12 % MT SOLN: 15.0000 mL | Freq: Once | OROMUCOSAL | Status: AC

## 2021-12-25 MED ORDER — FENTANYL CITRATE (PF) 100 MCG/2ML IJ SOLN
INTRAMUSCULAR | Status: AC
  Filled 2021-12-25: qty 2

## 2021-12-25 MED ORDER — ONDANSETRON HCL 4 MG/2ML IJ SOLN
INTRAMUSCULAR | Status: AC
  Filled 2021-12-25: qty 2

## 2021-12-25 MED ORDER — SUGAMMADEX SODIUM 200 MG/2ML IV SOLN
INTRAVENOUS | Status: DC | PRN
  Administered 2021-12-25: 200 mg via INTRAVENOUS

## 2021-12-25 MED ORDER — CEFAZOLIN SODIUM-DEXTROSE 2-4 GM/100ML-% IV SOLN
2.0000 g | INTRAVENOUS | Status: AC
  Administered 2021-12-25: 2 g via INTRAVENOUS

## 2021-12-25 MED ORDER — FENTANYL CITRATE (PF) 100 MCG/2ML IJ SOLN: 25.0000 ug | INTRAMUSCULAR | Status: DC | PRN

## 2021-12-25 MED ORDER — CHLORHEXIDINE GLUCONATE CLOTH 2 % EX PADS
6.0000 | MEDICATED_PAD | Freq: Once | CUTANEOUS | Status: AC
  Administered 2021-12-25: 6 via TOPICAL

## 2021-12-25 SURGICAL SUPPLY — 32 items
APPLIER CLIP 11 MED OPEN (CLIP)
APPLIER CLIP 13 LRG OPEN (CLIP)
BLADE CLIPPER SURG (BLADE) ×1 IMPLANT
CHLORAPREP W/TINT 26 (MISCELLANEOUS) ×2 IMPLANT
CLIP APPLIE 11 MED OPEN (CLIP) ×1 IMPLANT
CLIP APPLIE 13 LRG OPEN (CLIP) ×1 IMPLANT
DRAPE LAPAROTOMY 100X77 ABD (DRAPES) ×2 IMPLANT
DRSG TELFA 3X8 NADH STRL (GAUZE/BANDAGES/DRESSINGS) ×1 IMPLANT
ELECT CAUTERY BLADE 6.4 (BLADE) ×2 IMPLANT
ELECT REM PT RETURN 9FT ADLT (ELECTROSURGICAL) ×2
ELECTRODE REM PT RTRN 9FT ADLT (ELECTROSURGICAL) ×2 IMPLANT
GAUZE 4X4 16PLY ~~LOC~~+RFID DBL (SPONGE) ×1 IMPLANT
GAUZE SPONGE 4X4 12PLY STRL (GAUZE/BANDAGES/DRESSINGS) ×1 IMPLANT
GLOVE BIO SURGEON STRL SZ7 (GLOVE) ×11 IMPLANT
GOWN STRL REUS W/ TWL LRG LVL3 (GOWN DISPOSABLE) ×7 IMPLANT
GOWN STRL REUS W/TWL LRG LVL3 (GOWN DISPOSABLE) ×10
MANIFOLD NEPTUNE II (INSTRUMENTS) ×2 IMPLANT
MESH VENTRALEX ST 2.5 CRC MED (Mesh General) ×1 IMPLANT
NDL HYPO 25X1 1.5 SAFETY (NEEDLE) ×1 IMPLANT
NEEDLE HYPO 22GX1.5 SAFETY (NEEDLE) ×2 IMPLANT
NEEDLE HYPO 25X1 1.5 SAFETY (NEEDLE) ×2 IMPLANT
PACK BASIN MINOR ARMC (MISCELLANEOUS) ×2 IMPLANT
SPONGE T-LAP 18X18 ~~LOC~~+RFID (SPONGE) ×2 IMPLANT
STAPLER SKIN PROX 35W (STAPLE) ×1 IMPLANT
SUT ETHIBOND 0 MO6 C/R (SUTURE) ×3 IMPLANT
SUT VIC AB 2-0 SH 27 (SUTURE)
SUT VIC AB 2-0 SH 27XBRD (SUTURE) ×2 IMPLANT
SYR 20ML LL LF (SYRINGE) ×2 IMPLANT
TAPE MICROFOAM 4IN (TAPE) ×1 IMPLANT
TRAP FLUID SMOKE EVACUATOR (MISCELLANEOUS) ×2 IMPLANT
WATER STERILE IRR 1000ML POUR (IV SOLUTION) ×1 IMPLANT
WATER STERILE IRR 500ML POUR (IV SOLUTION) ×1 IMPLANT

## 2021-12-25 NOTE — Progress Notes (Signed)
Patient O2 saturation at baseline in Pre-op was 88%. [place] applied at 2L and she is now 94% Surgeon and Anesthesiologist were made aware.

## 2021-12-25 NOTE — Op Note (Signed)
Abdominal Hernia Repair incarcerated 3 cms with Ventralex mesh 6.4 cms  Pre-operative Diagnosis: incarcerated epigastric hernia 3 cms  Post-operative Diagnosis: same  Surgeon: Caroleen Hamman, MD FACS  Anesthesia: Gen. with endotracheal tube  Findings: Incarcerated [facility] 3 cm w omentum  Estimated Blood Loss: 10cc              Specimens: Hernia sac          Complications: none              Procedure Details  The patient was seen again in the Holding Room. The benefits, complications, treatment options, and expected outcomes were discussed with the patient. The risks of bleeding, infection, recurrence of symptoms, failure to resolve symptoms, bowel injury, mesh placement, mesh infection, any of which could require further surgery were reviewed with the patient. The likelihood of improving the patient's symptoms with return to their baseline status is good.  The patient and/or family concurred with the proposed plan, giving informed consent.  The patient was taken to Operating Room, identified as  Sink and the procedure verified.  A Time Out was held and the above information confirmed.  Prior to the induction of general anesthesia, antibiotic prophylaxis was administered. VTE prophylaxis was in place. General endotracheal anesthesia was then administered and tolerated well. After the induction, the abdomen was prepped with Chloraprep and draped in the sterile fashion. The patient was positioned in the supine position.  Incision was created with a scalpel over the hernia defect. Electrocautery was used to dissect through subcutaneous tissue, the hernia sac was opened and rises of adhesion was performed with Metzenbaum's scissors. Hernia sac was excised, chronically incarcerated omentum resected, no evidence of bowel compromise. The hernia was measured and the mesh was selected.   The mesh was placed in an underlay fashion. 4 trans-fascial ethibond sutures were placed and used to anchor  the mesh. The mesh lay really nicely again significant abdominal wall.  I closed the hernia defect with interrupted 0 Ethibond sutures.   Incisions were closed in a 2 layer fashion with 3-0 Vicryl and 4-0 Monocryl. Dermabond was used to coat the skin. Liposomal Marcaine was used to inject all the incision sites. Patient tolerated procedure well and there were no immediate complications. Needle and laparotomy counts were correct   Caroleen Hamman, MD, FACS

## 2021-12-25 NOTE — Anesthesia Preprocedure Evaluation (Addendum)
Anesthesia Evaluation  Patient identified by MRN, date of birth, ID band Patient awake    Reviewed: Allergy & Precautions, H&P , NPO status , reviewed documented beta blocker date and time   History of Anesthesia Complications Negative for: history of anesthetic complications  Airway Mallampati: II  TM Distance: >3 FB     Dental  (+) Chipped, Caps, Dental Advidsory Given, Missing, Poor Dentition   Pulmonary shortness of breath and with exertion, asthma , neg sleep apnea, COPD, neg recent URI, Current Smoker and Patient abstained from smoking.,    Pulmonary exam normal        Cardiovascular negative cardio ROS Normal cardiovascular exam     Neuro/Psych PSYCHIATRIC DISORDERS Anxiety Depression    GI/Hepatic Neg liver ROS, GERD  Controlled,  Endo/Other    Renal/GU negative Renal ROS     Musculoskeletal  (+) Arthritis , Osteoarthritis,    Abdominal   Peds  Hematology   Anesthesia Other Findings Past Medical History: No date: Asthma No date: Bronchitis No date: Cancer [facility])     Comment:  lung cancer 10/24/2016: Cervical high risk HPV (human papillomavirus) test positive No date: COPD (chronic obstructive pulmonary disease) ([place]) No date: Depression 10/22/2016: HPV (human papilloma virus) infection     Comment:  Evaluated by GYN [facility] 10/24/2016: LGSIL on Pap smear of cervix     Comment:  With HPV(+) and mild dysplasia 2004: Lung cancer ([place]) No date: Lung cancer ([place]) No date: OA (osteoarthritis) of hip     Comment:  right No date: Osteopenia No date: Personal history of chemotherapy No date: Urticaria   Reproductive/Obstetrics                             Anesthesia Physical  Anesthesia Plan  ASA: 3  Anesthesia Plan: General   Post-op Pain Management:    Induction: Intravenous  PONV Risk Score and Plan: 2 and Ondansetron, Dexamethasone and Treatment may vary due to age  or medical condition  Airway Management Planned: Oral ETT  Additional Equipment:   Intra-op Plan:   Post-operative Plan: Extubation in OR and Possible Post-op intubation/ventilation  Informed Consent: I have reviewed the patients History and Physical, chart, labs and discussed the procedure including the risks, benefits and alternatives for the proposed anesthesia with the patient or authorized representative who has indicated his/her understanding and acceptance.     Dental Advisory Given  Plan Discussed with: CRNA  Anesthesia Plan Comments:        Anesthesia Quick Evaluation

## 2021-12-25 NOTE — Anesthesia Procedure Notes (Signed)
Procedure Name: Intubation Date/Time: 12/25/2021 11:39 AM  Performed by: Cammie Sickle, CRNAPre-anesthesia Checklist: Patient identified, Patient being monitored, Timeout performed, Emergency Drugs available and Suction available Patient Re-evaluated:Patient Re-evaluated prior to induction Oxygen Delivery Method: Circle system utilized Preoxygenation: Pre-oxygenation with 100% oxygen Induction Type: IV induction Ventilation: Mask ventilation without difficulty Laryngoscope Size: 3 and McGraph Grade View: Grade I Tube type: Oral Tube size: 7.0 mm Number of attempts: 1 Airway Equipment and Method: Stylet Placement Confirmation: ETT inserted through vocal cords under direct vision, positive ETCO2 and breath sounds checked- equal and bilateral Secured at: 21 cm Tube secured with: Tape Dental Injury: Teeth and Oropharynx as per pre-operative assessment

## 2021-12-25 NOTE — Transfer of Care (Signed)
Immediate Anesthesia Transfer of Care Note  Patient: Debra Mccann  Procedure(s) Performed: HERNIA REPAIR VENTRAL ADULT, open INSERTION OF MESH  Patient Location: PACU  Anesthesia Type:General  Level of Consciousness: awake  Airway & Oxygen Therapy: Patient Spontanous Breathing and Patient connected to face mask oxygen  Post-op Assessment: Report given to RN and Post -op Vital signs reviewed and stable  Post vital signs: Reviewed and stable  Last Vitals:  Vitals Value Taken Time  BP 155/86 12/25/21 1250  Temp 36.1 C 12/25/21 1250  Pulse 91 12/25/21 1258  Resp 19 12/25/21 1258  SpO2 99 % 12/25/21 1258  Vitals shown include unvalidated device data.  Last Pain:  Vitals:   12/25/21 0933  TempSrc: Oral  PainSc: 0-No pain         Complications: No notable events documented.

## 2021-12-25 NOTE — Interval H&P Note (Signed)
History and Physical Interval Note:  12/25/2021 11:01 AM  [place]  has presented today for surgery, with the diagnosis of Incarcerated ventral hernia 3 cm.  The various methods of treatment have been discussed with the patient and family. After consideration of risks, benefits and other options for treatment, the patient has consented to  Procedure(s): HERNIA REPAIR VENTRAL ADULT, open (N/A) as a surgical intervention.  The patient's history has been reviewed, patient examined, no change in status, stable for surgery.  I have reviewed the patient's chart and labs.  Questions were answered to the patient's satisfaction.   She does have chronic COPD but w crescendo symptoms, moreover I can nor rule out bowel incarceration., I do think she needs a prompt operation before her pain interferes even further with respiratory mechanics. I do not think we have time to further optimize her pulmonary condition. If we do not act promptly and she has bowel incarceration delayed her surgical care will have disastrous complications. I do not think this is an elective case but rather urgent. I am planning on doing it open so we can avoid pneumoperitoneum that will further interfere with pulmonary mechanics   Taiwan Millon F Tamir Wallman

## 2021-12-25 NOTE — Discharge Instructions (Addendum)
Open Hernia Repair, Adult, Care After What can I expect after the procedure? After the procedure, it is common to have: Mild discomfort. Slight bruising. Mild swelling. Pain in the belly (abdomen). A small amount of blood from the cut from surgery (incision). Follow these instructions at home: Your doctor may give you more specific instructions. If you have problems, call your doctor. Medicines Take over-the-counter and prescription medicines only as told by your doctor. If told, take steps to prevent problems with pooping (constipation). You may need to: Drink enough fluid to keep your pee (urine) pale yellow. Take medicines. You will be told what medicines to take. Eat foods that are high in fiber. These include beans, whole grains, and fresh fruits and vegetables. Limit foods that are high in fat and sugar. These include fried or sweet foods. Ask your doctor if you should avoid driving or using machines while you are taking your medicine. Incision care  Follow instructions from your doctor about how to take care of your incision. Make sure you: Wash your hands with soap and water for at least 20 seconds before and after you change your bandage (dressing). If you cannot use soap and water, use hand sanitizer. Change your bandage. Leave stitches or skin glue in place for at least 2 weeks. Leave tape strips alone unless you are told to take them off. You may trim the edges of the tape strips if they curl up. Check your incision every day for signs of infection. Check for: More redness, swelling, or pain. More fluid or blood. Warmth. Pus or a bad smell. Wear loose, soft clothing while your incision heals. Activity  Rest as told by your doctor. Do not lift anything that is heavier than 10 lb (4.5 kg), or the limit that you are told. Do not play contact sports until your doctor says that this is safe. If you were given a sedative during your procedure, do not drive or use machines  until your doctor says that it is safe. A sedative is a medicine that helps you relax. Return to your normal activities when your doctor says that it is safe. General instructions Do not take baths, swim, or use a hot tub. Ask your doctor about taking showers or sponge baths. Hold a pillow over your belly when you cough or sneeze. This helps with pain. Do not smoke or use any products that contain nicotine or tobacco. If you need help quitting, ask your doctor. Keep all follow-up visits. Contact a doctor if: You have any of these signs of infection in or around your incision: More redness, swelling, or pain. More fluid or blood. Warmth. Pus. A bad smell. You have a fever or chills. You have blood in your poop (stool). You have not pooped (had a bowel movement) in 2-3 days. Medicine does not help your pain. Get help right away if: You have chest pain, or you are short of breath. You feel faint or light-headed. You have very bad pain. You vomit and your pain is worse. You have pain, swelling, or redness in a leg. These symptoms may be an emergency. Get help right away. Call your local emergency services (911 in the [place] Do not wait to see if the symptoms will go away. Do not drive yourself to the hospital. Summary After this procedure, it is common to have mild discomfort, slight bruising, and mild swelling. Follow instructions from your doctor about how to take care of your cut from surgery (incision). Check every  day for signs of infection. Do not lift heavy objects or play contact sports until your doctor says it is safe. Return to your normal activities as told by your doctor. This information is not intended to replace advice given to you by your health care provider. Make sure you discuss any questions you have with your health care provider. Document Revised: 09/27/2019 Document Reviewed: 09/27/2019 Elsevier Patient Education  [place]   The drugs that you were given will stay in your system until tomorrow so for the next 24 hours you should not:  Drive an automobile Make any legal decisions Drink any alcoholic beverage   You may resume regular meals tomorrow.  Today it is better to start with liquids and gradually work up to solid foods.  You may eat anything you prefer, but it is better to start with liquids, then soup and crackers, and gradually work up to solid foods.   Please notify your doctor immediately if you have any unusual bleeding, trouble breathing, redness and pain at the surgery site, drainage, fever, or pain not relieved by medication.    Additional Instructions:        Please contact your physician with any problems or Same Day Surgery at [phone], Monday through Friday 6 am to 4 pm, or [place] at [facility] number at [phone].

## 2021-12-26 ENCOUNTER — Encounter: Payer: Self-pay | Admitting: Surgery

## 2021-12-26 LAB — SURGICAL PATHOLOGY

## 2021-12-27 ENCOUNTER — Ambulatory Visit: Payer: 59 | Admitting: Obstetrics and Gynecology

## 2021-12-31 ENCOUNTER — Emergency Department: Payer: 59

## 2021-12-31 ENCOUNTER — Inpatient Hospital Stay
Admission: EM | Admit: 2021-12-31 | Discharge: 2022-01-03 | DRG: 191 | Disposition: A | Discharge status: Home / Self Care | Payer: 59 | Attending: Internal Medicine | Admitting: Internal Medicine

## 2021-12-31 DIAGNOSIS — R0602 Shortness of breath: Secondary | ICD-10-CM | POA: Diagnosis not present

## 2021-12-31 DIAGNOSIS — Z1152 Encounter for screening for COVID-19: Secondary | ICD-10-CM

## 2021-12-31 DIAGNOSIS — E785 Hyperlipidemia, unspecified: Secondary | ICD-10-CM | POA: Diagnosis present

## 2021-12-31 DIAGNOSIS — K219 Gastro-esophageal reflux disease without esophagitis: Secondary | ICD-10-CM | POA: Diagnosis not present

## 2021-12-31 DIAGNOSIS — F419 Anxiety disorder, unspecified: Secondary | ICD-10-CM | POA: Diagnosis not present

## 2021-12-31 DIAGNOSIS — J9601 Acute respiratory failure with hypoxia: Secondary | ICD-10-CM | POA: Diagnosis present

## 2021-12-31 DIAGNOSIS — F17200 Nicotine dependence, unspecified, uncomplicated: Secondary | ICD-10-CM | POA: Diagnosis present

## 2021-12-31 DIAGNOSIS — Z79899 Other long term (current) drug therapy: Secondary | ICD-10-CM | POA: Diagnosis not present

## 2021-12-31 DIAGNOSIS — G43909 Migraine, unspecified, not intractable, without status migrainosus: Secondary | ICD-10-CM | POA: Diagnosis not present

## 2021-12-31 DIAGNOSIS — E872 Acidosis, unspecified: Secondary | ICD-10-CM | POA: Diagnosis present

## 2021-12-31 DIAGNOSIS — Z0389 Encounter for observation for other suspected diseases and conditions ruled out: Secondary | ICD-10-CM | POA: Diagnosis not present

## 2021-12-31 DIAGNOSIS — Z902 Acquired absence of lung [part of]: Secondary | ICD-10-CM

## 2021-12-31 DIAGNOSIS — Z743 Need for continuous supervision: Secondary | ICD-10-CM | POA: Diagnosis not present

## 2021-12-31 DIAGNOSIS — Z85118 Personal history of other malignant neoplasm of bronchus and lung: Secondary | ICD-10-CM | POA: Diagnosis not present

## 2021-12-31 DIAGNOSIS — Z9889 Other specified postprocedural states: Secondary | ICD-10-CM | POA: Diagnosis not present

## 2021-12-31 DIAGNOSIS — J449 Chronic obstructive pulmonary disease, unspecified: Secondary | ICD-10-CM | POA: Diagnosis present

## 2021-12-31 DIAGNOSIS — Z818 Family history of other mental and behavioral disorders: Secondary | ICD-10-CM | POA: Diagnosis not present

## 2021-12-31 DIAGNOSIS — F32A Depression, unspecified: Secondary | ICD-10-CM | POA: Diagnosis not present

## 2021-12-31 DIAGNOSIS — A419 Sepsis, unspecified organism: Secondary | ICD-10-CM | POA: Diagnosis present

## 2021-12-31 DIAGNOSIS — Z9221 Personal history of antineoplastic chemotherapy: Secondary | ICD-10-CM

## 2021-12-31 DIAGNOSIS — R062 Wheezing: Secondary | ICD-10-CM | POA: Diagnosis not present

## 2021-12-31 DIAGNOSIS — Z880 Allergy status to penicillin: Secondary | ICD-10-CM | POA: Diagnosis not present

## 2021-12-31 DIAGNOSIS — F17210 Nicotine dependence, cigarettes, uncomplicated: Secondary | ICD-10-CM | POA: Diagnosis not present

## 2021-12-31 DIAGNOSIS — J441 Chronic obstructive pulmonary disease with (acute) exacerbation: Secondary | ICD-10-CM | POA: Diagnosis not present

## 2021-12-31 DIAGNOSIS — R69 Illness, unspecified: Secondary | ICD-10-CM | POA: Diagnosis not present

## 2021-12-31 LAB — CBC WITH DIFFERENTIAL/PLATELET
Abs Immature Granulocytes: 0.02 10*3/uL (ref 0.00–0.07)
Basophils Absolute: 0.1 10*3/uL (ref 0.0–0.1)
Basophils Relative: 1 %
Eosinophils Absolute: 0.5 10*3/uL (ref 0.0–0.5)
Eosinophils Relative: 7 %
HCT: 44.3 % (ref 36.0–46.0)
Hemoglobin: 14.5 g/dL (ref 12.0–15.0)
Immature Granulocytes: 0 %
Lymphocytes Relative: 18 %
Lymphs Abs: 1.2 10*3/uL (ref 0.7–4.0)
MCH: 28.3 pg (ref 26.0–34.0)
MCHC: 32.7 g/dL (ref 30.0–36.0)
MCV: 86.5 fL (ref 80.0–100.0)
Monocytes Absolute: 0.5 10*3/uL (ref 0.1–1.0)
Monocytes Relative: 7 %
Neutro Abs: 4.6 10*3/uL (ref 1.7–7.7)
Neutrophils Relative %: 67 %
Platelets: 265 10*3/uL (ref 150–400)
RBC: 5.12 MIL/uL — ABNORMAL HIGH (ref 3.87–5.11)
RDW: 14.4 % (ref 11.5–15.5)
WBC: 6.8 10*3/uL (ref 4.0–10.5)
nRBC: 0 % (ref 0.0–0.2)

## 2021-12-31 LAB — COMPREHENSIVE METABOLIC PANEL
ALT: 10 U/L (ref 0–44)
AST: 15 U/L (ref 15–41)
Albumin: 3.5 g/dL (ref 3.5–5.0)
Alkaline Phosphatase: 40 U/L (ref 38–126)
Anion gap: 8 (ref 5–15)
BUN: 7 mg/dL — ABNORMAL LOW (ref 8–23)
CO2: 28 mmol/L (ref 22–32)
Calcium: 8.9 mg/dL (ref 8.9–10.3)
Chloride: 106 mmol/L (ref 98–111)
Creatinine, Ser: 0.69 mg/dL (ref 0.44–1.00)
GFR, Estimated: >60 mL/min (ref >60)
Glucose, Bld: 127 mg/dL — ABNORMAL HIGH (ref 70–99)
Potassium: 3.8 mmol/L (ref 3.5–5.1)
Sodium: 142 mmol/L (ref 135–145)
Total Bilirubin: 0.6 mg/dL (ref 0.3–1.2)
Total Protein: 6.3 g/dL — ABNORMAL LOW (ref 6.5–8.1)

## 2021-12-31 LAB — PROCALCITONIN: Procalcitonin: <0.1 ng/mL

## 2021-12-31 LAB — TROPONIN I (HIGH SENSITIVITY)
Troponin I (High Sensitivity): 8 ng/L (ref <18)
Troponin I (High Sensitivity): 7 ng/L (ref <18)

## 2021-12-31 LAB — SARS CORONAVIRUS 2 BY RT PCR: SARS Coronavirus 2 by RT PCR: NEGATIVE

## 2021-12-31 MED ORDER — ENOXAPARIN SODIUM 40 MG/0.4ML IJ SOSY
40.0000 mg | PREFILLED_SYRINGE | INTRAMUSCULAR | Status: DC
  Administered 2021-12-31 – 2022-01-02 (×3): 40 mg via SUBCUTANEOUS
  Filled 2021-12-31 (×3): qty 0.4

## 2021-12-31 MED ORDER — ONDANSETRON HCL 4 MG/2ML IJ SOLN: 4.0000 mg | Freq: Three times a day (TID) | INTRAMUSCULAR | Status: DC | PRN

## 2021-12-31 MED ORDER — DULOXETINE HCL 30 MG PO CPEP
30.0000 mg | ORAL_CAPSULE | Freq: Every day | ORAL | Status: DC
  Administered 2021-12-31 – 2022-01-03 (×4): 30 mg via ORAL
  Filled 2021-12-31 (×5): qty 1

## 2021-12-31 MED ORDER — IPRATROPIUM-ALBUTEROL 0.5-2.5 (3) MG/3ML IN SOLN
3.0000 mL | RESPIRATORY_TRACT | Status: DC
  Administered 2021-12-31 – 2022-01-01 (×6): 3 mL via RESPIRATORY_TRACT
  Filled 2021-12-31 (×6): qty 3

## 2021-12-31 MED ORDER — VITAMIN B-12 100 MCG PO TABS
100.0000 ug | ORAL_TABLET | Freq: Every day | ORAL | Status: DC
  Administered 2021-12-31 – 2022-01-03 (×4): 100 ug via ORAL
  Filled 2021-12-31 (×5): qty 1

## 2021-12-31 MED ORDER — VITAMIN D3 25 MCG (1000 UNIT) PO TABS
500.0000 [IU] | ORAL_TABLET | Freq: Every day | ORAL | Status: DC
  Administered 2021-12-31 – 2022-01-03 (×4): 500 [IU] via ORAL
  Filled 2021-12-31: qty 0.5
  Filled 2021-12-31 (×2): qty 1
  Filled 2021-12-31: qty 0.5
  Filled 2021-12-31 (×2): qty 1
  Filled 2021-12-31 (×2): qty 0.5

## 2021-12-31 MED ORDER — ATORVASTATIN CALCIUM 20 MG PO TABS
40.0000 mg | ORAL_TABLET | Freq: Every day | ORAL | Status: DC
  Administered 2021-12-31 – 2022-01-02 (×3): 40 mg via ORAL
  Filled 2021-12-31 (×3): qty 2

## 2021-12-31 MED ORDER — AZITHROMYCIN 500 MG PO TABS
250.0000 mg | ORAL_TABLET | Freq: Every day | ORAL | Status: DC
  Administered 2022-01-01 – 2022-01-03 (×3): 250 mg via ORAL
  Filled 2021-12-31 (×3): qty 1

## 2021-12-31 MED ORDER — NICOTINE 21 MG/24HR TD PT24
21.0000 mg | MEDICATED_PATCH | Freq: Every day | TRANSDERMAL | Status: DC
  Administered 2021-12-31 – 2022-01-03 (×4): 21 mg via TRANSDERMAL
  Filled 2021-12-31 (×4): qty 1

## 2021-12-31 MED ORDER — IOHEXOL 350 MG/ML SOLN
75.0000 mL | Freq: Once | INTRAVENOUS | Status: AC | PRN
  Administered 2021-12-31: 75 mL via INTRAVENOUS

## 2021-12-31 MED ORDER — ALBUTEROL SULFATE (2.5 MG/3ML) 0.083% IN NEBU
2.5000 mg | INHALATION_SOLUTION | RESPIRATORY_TRACT | Status: DC | PRN
  Administered 2022-01-02: 2.5 mg via RESPIRATORY_TRACT
  Filled 2021-12-31: qty 3

## 2021-12-31 MED ORDER — ACETAMINOPHEN 325 MG PO TABS
650.0000 mg | ORAL_TABLET | Freq: Four times a day (QID) | ORAL | Status: DC | PRN
  Administered 2021-12-31 – 2022-01-01 (×2): 650 mg via ORAL
  Filled 2021-12-31 (×2): qty 2

## 2021-12-31 MED ORDER — IPRATROPIUM-ALBUTEROL 0.5-2.5 (3) MG/3ML IN SOLN
3.0000 mL | Freq: Once | RESPIRATORY_TRACT | Status: AC
  Administered 2021-12-31: 3 mL via RESPIRATORY_TRACT
  Filled 2021-12-31: qty 3

## 2021-12-31 MED ORDER — SUMATRIPTAN SUCCINATE 50 MG PO TABS: 50.0000 mg | ORAL_TABLET | ORAL | Status: DC | PRN

## 2021-12-31 MED ORDER — LORATADINE 10 MG PO TABS
10.0000 mg | ORAL_TABLET | Freq: Every day | ORAL | Status: DC
  Administered 2021-12-31 – 2022-01-03 (×4): 10 mg via ORAL
  Filled 2021-12-31 (×4): qty 1

## 2021-12-31 MED ORDER — PANTOPRAZOLE SODIUM 40 MG PO TBEC
40.0000 mg | DELAYED_RELEASE_TABLET | Freq: Every day | ORAL | Status: DC
  Administered 2021-12-31 – 2022-01-03 (×4): 40 mg via ORAL
  Filled 2021-12-31 (×4): qty 1

## 2021-12-31 MED ORDER — ACETAMINOPHEN 500 MG PO TABS
1000.0000 mg | ORAL_TABLET | Freq: Once | ORAL | Status: AC
  Administered 2021-12-31: 1000 mg via ORAL
  Filled 2021-12-31: qty 2

## 2021-12-31 MED ORDER — AZITHROMYCIN 500 MG PO TABS
500.0000 mg | ORAL_TABLET | Freq: Every day | ORAL | Status: AC
  Administered 2021-12-31: 500 mg via ORAL
  Filled 2021-12-31: qty 1

## 2021-12-31 MED ORDER — DM-GUAIFENESIN ER 30-600 MG PO TB12: 1.0000 | ORAL_TABLET | Freq: Two times a day (BID) | ORAL | Status: DC | PRN

## 2021-12-31 MED ORDER — TRAMADOL HCL 50 MG PO TABS
50.0000 mg | ORAL_TABLET | Freq: Four times a day (QID) | ORAL | Status: DC | PRN
  Administered 2021-12-31 – 2022-01-02 (×3): 50 mg via ORAL
  Filled 2021-12-31 (×3): qty 1

## 2021-12-31 MED ORDER — METHYLPREDNISOLONE SODIUM SUCC 40 MG IJ SOLR
80.0000 mg | INTRAMUSCULAR | Status: DC
  Administered 2021-12-31 – 2022-01-02 (×3): 80 mg via INTRAVENOUS
  Filled 2021-12-31 (×3): qty 2

## 2021-12-31 NOTE — ED Provider Notes (Signed)
[facility] Provider Note    Event Date/Time   First MD Initiated Contact with Patient 12/31/21 1352     (approximate)   History   Respiratory Distress   HPI  Debra Mccann is a 66 y.o. female  with pmh COPD, non-small cell carcinoma of the right lung status post right upper lung resection tobacco use disorder, anxiety, depression, recent ventral hernia repair who presents with shortness of breath.  Patient had ventral hernia repair on 10/31.  She notes that she was having some difficulty breathing in the hospital during surgery and since coming home has continued to feel short of breath.  She initially had cough but this is resolved.  Denies fevers or chills.  She does have some right upper back pain but no frank chest pain denies lower extremity swelling or pain.  Apparently her pulse ox was low at home.  She does not wear oxygen normally.  Patient does or endorse a typical migraine headache this morning as well as decreased appetite.  No diarrhea.  Patient was satting in the 41s.  She got albuterol neb DuoNeb 125 mg of Solu-Medrol and 1 g of IV mag.     Past Medical History:  Diagnosis Date   Asthma    Bronchitis    Cancer ([place])    lung cancer   Cervical high risk HPV (human papillomavirus) test positive 10/24/2016   COPD (chronic obstructive pulmonary disease) (HCC)    Depression    HPV (human papilloma virus) infection 10/22/2016   Evaluated by GYN [facility]   LGSIL on Pap smear of cervix 10/24/2016   With HPV(+) and mild dysplasia   Lung cancer ([place]) 2004   Lung cancer ([place])    OA (osteoarthritis) of hip    right   Osteopenia    Personal history of chemotherapy    Urticaria     Patient Active Problem List   Diagnosis Date Noted   Incarcerated ventral hernia    SOB (shortness of breath)    COPD exacerbation ([place]) 05/11/2021   COPD with acute bronchitis ([place]) 05/10/2021   Acute respiratory failure with hypoxia ([place]) 05/10/2021    Anxiety and depression 05/10/2021   Ganglion cyst of volar aspect of left wrist 08/23/2020   Dyslipidemia 10/24/2016   LGSIL on Pap smear of cervix 10/24/2016   Cervical high risk HPV (human papillomavirus) test positive 10/24/2016   HPV (human papilloma virus) infection 10/22/2016   Preventative health care 05/23/2016   Obesity, Class II, BMI 35-39.9, no comorbidity 05/23/2016   Odynophagia 02/22/2016   Food sticks on swallowing 02/22/2016   Weight gain 01/30/2016   Fatigue 01/30/2016   Erythrocytosis 06/15/2015   Snoring 06/15/2015   Calcium oxalate crystals in urine 06/14/2015   Elevated red blood cell count 06/14/2015   Emphysema/COPD ([place]) 01/30/2015   Night sweats 01/30/2015   Cough 01/30/2015   Tobacco use disorder 12/09/2014   Vitamin D deficiency 11/18/2014   Major depressive disorder, recurrent episode, moderate with anxious distress (HCC)    COPD (chronic obstructive pulmonary disease) ([place])    Urticaria    OA (osteoarthritis) of hip    History of primary non-small cell carcinoma of right lung s/p RUL resection 2004    Osteopenia 08/08/2014     Physical Exam  Triage Vital Signs: ED Triage Vitals  Enc Vitals Group     BP      Pulse      Resp      Temp  Temp src      SpO2      Weight      Height      Head Circumference      Peak Flow      Pain Score      Pain Loc      Pain Edu?      Excl. in [place]?     Most recent vital signs: Vitals:   12/31/21 1400 12/31/21 1403  BP: 131/65   Pulse: 95   Resp: 15   Temp:  98.9 F (37.2 C)  SpO2: 99% 98%     General: Awake, no distress.  CV:  Good peripheral perfusion.  No peripheral edema Resp:  Tachypneic, expiratory wheezing throughout moderately decreased air movement Abd:  No distention.  Midline incision does have some mild surrounding erythema no drainage Neuro:             Awake, Alert, Oriented x 3  Other:     ED Results / Procedures / Treatments  Labs (all labs ordered are listed, but only  abnormal results are displayed) Labs Reviewed  COMPREHENSIVE METABOLIC PANEL - Abnormal; Notable for the following components:      Result Value   Glucose, Bld 127 (*)    BUN 7 (*)    Total Protein 6.3 (*)    All other components within normal limits  CBC WITH DIFFERENTIAL/PLATELET - Abnormal; Notable for the following components:   RBC 5.12 (*)    All other components within normal limits  SARS CORONAVIRUS 2 BY RT PCR  TROPONIN I (HIGH SENSITIVITY)     EKG  EKG interpretation performed by myself: NSR, nml axis, nml intervals, no acute ischemic changes    RADIOLOGY I reviewed and interpreted the CXR which does not show any acute cardiopulmonary process    PROCEDURES:  Critical Care performed: Yes, see critical care procedure note(s)  .Critical Care  Performed by: Rada Hay, MD Authorized by: Rada Hay, MD   Critical care provider statement:    Critical care time (minutes):  30   Critical care was time spent personally by me on the following activities:  Development of treatment plan with patient or surrogate, discussions with consultants, evaluation of patient's response to treatment, examination of patient, ordering and review of laboratory studies, ordering and review of radiographic studies, ordering and performing treatments and interventions, pulse oximetry, re-evaluation of patient's condition and review of old charts   The patient is on the cardiac monitor to evaluate for evidence of arrhythmia and/or significant heart rate changes.   MEDICATIONS ORDERED IN ED: Medications  ipratropium-albuterol (DUONEB) 0.5-2.5 (3) MG/3ML nebulizer solution 3 mL (3 mLs Nebulization Given 12/31/21 1412)     IMPRESSION / MDM / ASSESSMENT AND PLAN / ED COURSE  I reviewed the triage vital signs and the nursing notes.                              Patient's presentation is most consistent with acute presentation with potential threat to life or bodily  function.  Differential diagnosis includes, but is not limited to, COPD exacerbation, atelectasis, pneumonia, viral illness including COVID-19, influenza, pulmonary embolism  The patient is a 66 year old female with underlying COPD and recent ventral hernia repair presents with shortness of breath.  She is hypoxic and for EMS received DuoNebs Solu-Medrol and mag.  Arrival to ED she is getting a DuoNeb.  Does appear slightly dyspneic  but is able to speak in full sentences she is wheezing throughout does not have any lower extremity swelling.  Tells me she has been short of breath really since she had the surgery nothing acutely changed today but apparently pulse ox was low at home.  She is not having any cough currently no hemoptysis has no history of DVT/PE.  Plan to obtain labs chest x-ray and will obtain a CTA given the recent surgery he is at high risk for for PE.  Will give additional DuoNeb  Patient's chest x-ray is clear.  Labs including troponin are reassuring.  She is on 2 L satting about 93%.  CTA is pending at the time of signout.  Patient will require admission given her respiratory failure with hypoxia.     FINAL CLINICAL IMPRESSION(S) / ED DIAGNOSES   Final diagnoses:  Acute respiratory failure with hypoxia ([place])     Rx / DC Orders   ED Discharge Orders     None        Note:  This document was prepared using Dragon voice recognition software and may include unintentional dictation errors.   Rada Hay, MD 12/31/21 1504

## 2021-12-31 NOTE — ED Provider Notes (Signed)
Patient care assumed at 3 PM.  Briefly, patient has a history of COPD and recent surgery here with acute on chronic shortness of breath and hypoxia.  Patient given multiple DuoNebs, steroids, with persistent hypoxia.  CT angio obtained and is pending at time of signout.  This was reviewed and shows no evidence of acute abnormality.  Will plan to admit for acute hypoxia with COPD.  Patient in agreement with this plan.   Duffy Bruce, MD 12/31/21 1555

## 2021-12-31 NOTE — H&P (Signed)
History and Physical    Debra Mccann:[id number] DOB: [birth year] DOA: 12/31/2021  Referring MD/NP/PA:   PCP: Danelle Berry, NP   Patient coming from:  The patient is coming from home.  At baseline, pt is independent for most of ADL.        Chief Complaint: Shortness of breath  HPI: Debra Mccann is a 66 y.o. female with medical history significant of COPD not on oxygen normally, hyperlipidemia, asthma, GERD, depression with anxiety, tobacco abuse, migraine headaches, lung cancer of RUL 2004 (s/p of lobectomy, chemotherapy), recent surgery for incarcerated hernia on 10/31, who presents with shortness breath.  Patient states that she has shortness breath for more than 2 weeks, which has been progressively worsening in the past several days.  Patient has productive cough with thick sputum production.  Denies chest pain, fever or chills.  Patient also reports migraine headache which is associated with nausea and few times of vomiting.  Patient does not have diarrhea.  She has some pain in the recent surgical sites in the abdomen, but denies deep abdominal pain.  No symptoms of UTI.  Patient received 125 mg of Solu-Medrol and 1 g of magnesium sulfate by EMS.  Data reviewed independently and ED Course: pt was found to have WBC 6.8, troponin level 8, negative COVID PCR, GFR> 60, temperature normal, blood pressure 118/55, heart rate 95, RR 22, oxygen saturation 80% on room air, which improved to 92-99% on 1 L oxygen.  Chest x-ray negative.  CTA negative for PE patient is admitted to telemetry bed as inpatient      EKG: I have personally reviewed.  Sinus rhythm, QTc 464, low voltage, nonspecific T wave change.   Review of Systems:   General: no fevers, chills, no body weight gain, has poor appetite, has fatigue HEENT: no blurry vision, hearing changes or sore throat. Has HA Respiratory: has dyspnea, coughing, no wheezing CV: no chest pain, no palpitations GI: has nausea,  vomiting, no abdominal pain, diarrhea, constipation GU: no dysuria, burning on urination, increased urinary frequency, hematuria  Ext: no leg edema Neuro: no unilateral weakness, numbness, or tingling, no vision change or hearing loss Skin: no rash, no skin tear. MSK: No muscle spasm, no deformity, no limitation of range of movement in spin Heme: No easy bruising.  Travel history: No recent long distant travel.   Allergy:  Allergies  Allergen Reactions   Penicillins Hives    Past Medical History:  Diagnosis Date   Asthma    Bronchitis    Cancer ([place])    lung cancer   Cervical high risk HPV (human papillomavirus) test positive 10/24/2016   COPD (chronic obstructive pulmonary disease) (HCC)    Depression    HPV (human papilloma virus) infection 10/22/2016   Evaluated by GYN [facility]   LGSIL on Pap smear of cervix 10/24/2016   With HPV(+) and mild dysplasia   Lung cancer ([place]) 2004   Lung cancer ([place])    OA (osteoarthritis) of hip    right   Osteopenia    Personal history of chemotherapy    Urticaria     Past Surgical History:  Procedure Laterality Date   ABDOMINAL HYSTERECTOMY  1997   partial, only ovaries remain; due to heavy bleeding   CHOLECYSTECTOMY  1999   COLONOSCOPY WITH PROPOFOL N/A 05/19/2017   Procedure: COLONOSCOPY WITH PROPOFOL;  Surgeon: Jonathon Bellows, MD;  Location: [facility] ENDOSCOPY;  Service: Gastroenterology;  Laterality: N/A;   INSERTION OF MESH  12/25/2021   Procedure: INSERTION OF MESH;  Surgeon: Jules Husbands, MD;  Location: [facility] ORS;  Service: General;;   LUNG CANCER SURGERY Right 2004   removed "top portion of right lung"   [place] N/A 12/25/2021   Procedure: HERNIA REPAIR VENTRAL ADULT, open;  Surgeon: Jules Husbands, MD;  Location: [facility] ORS;  Service: General;  Laterality: N/A;    Social History:  reports that she has been smoking cigarettes. She has a 11.50 pack-year smoking history. She has never used smokeless tobacco. She  reports that she does not drink alcohol and does not use drugs.  Family History:  Family History  Problem Relation Age of Onset   CAD Mother        with stents   Heart disease Mother    Hypertension Mother    Heart disease Father        3 vessel CABG   Cancer Father        melanoma   Hypertension Father    Gout Father    Depression Daughter    Alzheimer's disease Maternal Grandmother    Breast cancer Cousin    Diabetes Neg Hx    Stroke Neg Hx    COPD Neg Hx      Prior to Admission medications   Medication Sig Start Date End Date Taking? Authorizing Provider  atorvastatin (LIPITOR) 40 MG tablet TAKE 1 TABLET BY MOUTH EVERY NIGHT AT BEDTIME 09/15/17  Yes Lada, Satira Anis, MD  cetirizine (ZYRTEC) 10 MG tablet Take 10 mg by mouth daily.   Yes [provider]  cholecalciferol (VITAMIN D3) 10 MCG (400 UNIT) TABS tablet Take 400 Units by mouth daily.   Yes [provider]  DULoxetine (CYMBALTA) 30 MG capsule Three pills by mouth daily 08/07/17  Yes Lada, Satira Anis, MD  ipratropium-albuterol (DUONEB) 0.5-2.5 (3) MG/3ML SOLN Inhale 3 mLs into the lungs every 6 (six) hours as needed. 12/20/21  Yes [provider]  omeprazole (PRILOSEC) 40 MG capsule Take 1 capsule (40 mg total) by mouth daily. 03/20/17 12/31/21 Yes Lada, Satira Anis, MD  vitamin B-12 (CYANOCOBALAMIN) 100 MCG tablet Take 100 mcg by mouth daily.   Yes [provider]  albuterol (VENTOLIN HFA) 108 (90 Base) MCG/ACT inhaler Inhale 2 puffs into the lungs every 6 (six) hours as needed.  06/17/15   [provider]  HYDROcodone-acetaminophen (NORCO/VICODIN) 5-325 MG tablet Take 1 tablet by mouth every 6 (six) hours as needed for moderate pain. Patient not taking: Reported on 12/31/2021 12/25/21   Jules Husbands, MD  traMADol (ULTRAM) 50 MG tablet Take 1 tablet (50 mg total) by mouth every 6 (six) hours as needed. 12/20/21 12/20/22  Lavonia Drafts, MD    Physical Exam: Vitals:   12/31/21 1404  12/31/21 1430 12/31/21 1500 12/31/21 1600  BP:  (!) 117/59 (!) 117/52 (!) 118/55  Pulse:  88 89 85  Resp:  18 (!) 22 14  Temp:      TempSrc:      SpO2:  99% 92% 92%  Weight: 70.3 kg      General: Not in acute distress HEENT:       Eyes: PERRL, EOMI, no scleral icterus.       ENT: No discharge from the ears and nose, no pharynx injection, no tonsillar enlargement.        Neck: No JVD, no bruit, no mass felt. Heme: No neck lymph node enlargement. Cardiac: S1/S2, RRR, No murmurs, No gallops or  rubs. Respiratory: Has decreased air movement bilaterally, no wheezing or rhonchi. GI: Soft, nondistended, nontender, no rebound pain, no organomegaly, BS present.  Surgical sites healing well. GU: No hematuria Ext: No pitting leg edema bilaterally. 1+DP/PT pulse bilaterally. Musculoskeletal: No joint deformities, No joint redness or warmth, no limitation of ROM in spin. Skin: No rashes.  Neuro: Alert, oriented X3, cranial nerves II-XII grossly intact, moves all extremities normally.  Psych: Patient is not psychotic, no suicidal or hemocidal ideation.  Labs on Admission: I have personally reviewed following labs and imaging studies  CBC: Recent Labs  Lab 12/31/21 1407  WBC 6.8  NEUTROABS 4.6  HGB 14.5  HCT 44.3  MCV 86.5  PLT 323   Basic Metabolic Panel: Recent Labs  Lab 12/31/21 1407  NA 142  K 3.8  CL 106  CO2 28  GLUCOSE 127*  BUN 7*  CREATININE 0.69  CALCIUM 8.9   GFR: Estimated Creatinine Clearance: 69.9 mL/min (by C-G formula based on SCr of 0.69 mg/dL). Liver Function Tests: Recent Labs  Lab 12/31/21 1407  AST 15  ALT 10  ALKPHOS 40  BILITOT 0.6  PROT 6.3*  ALBUMIN 3.5   No results for input(s): "LIPASE", "AMYLASE" in the last 168 hours. No results for input(s): "AMMONIA" in the last 168 hours. Coagulation Profile: No results for input(s): "INR", "PROTIME" in the last 168 hours. Cardiac Enzymes: No results for input(s): "CKTOTAL", "CKMB", "CKMBINDEX",  "TROPONINI" in the last 168 hours. BNP (last 3 results) No results for input(s): "PROBNP" in the last 8760 hours. HbA1C: No results for input(s): "HGBA1C" in the last 72 hours. CBG: No results for input(s): "GLUCAP" in the last 168 hours. Lipid Profile: No results for input(s): "CHOL", "HDL", "LDLCALC", "TRIG", "CHOLHDL", "LDLDIRECT" in the last 72 hours. Thyroid Function Tests: No results for input(s): "TSH", "T4TOTAL", "FREET4", "T3FREE", "THYROIDAB" in the last 72 hours. Anemia Panel: No results for input(s): "VITAMINB12", "FOLATE", "FERRITIN", "TIBC", "IRON", "RETICCTPCT" in the last 72 hours. Urine analysis:    Component Value Date/Time   COLORURINE YELLOW (A) 12/19/2021 1351   APPEARANCEUR HAZY (A) 12/19/2021 1351   APPEARANCEUR Cloudy (A) 06/08/2015 0941   LABSPEC 1.025 12/19/2021 1351   PHURINE 5.0 12/19/2021 1351   GLUCOSEU NEGATIVE 12/19/2021 1351   HGBUR NEGATIVE 12/19/2021 1351   BILIRUBINUR NEGATIVE 12/19/2021 1351   BILIRUBINUR Negative 06/08/2015 0941   KETONESUR 5 (A) 12/19/2021 1351   PROTEINUR 30 (A) 12/19/2021 1351   NITRITE NEGATIVE 12/19/2021 1351   LEUKOCYTESUR NEGATIVE 12/19/2021 1351   Sepsis Labs: @LABRCNTIP (procalcitonin:4,lacticidven:4) ) Recent Results (from the past 240 hour(s))  SARS Coronavirus 2 by RT PCR (hospital order, performed in [place] hospital lab) *cepheid single result test* Anterior Nasal Swab     Status: None   Collection Time: 12/31/21  2:07 PM   Specimen: Anterior Nasal Swab  Result Value Ref Range Status   SARS Coronavirus 2 by RT PCR NEGATIVE NEGATIVE Final    Comment: (NOTE) SARS-CoV-2 target nucleic acids are NOT DETECTED.  The SARS-CoV-2 RNA is generally detectable in upper and lower respiratory specimens during the acute phase of infection. The lowest concentration of SARS-CoV-2 viral copies this assay can detect is 250 copies / mL. A negative result does not preclude SARS-CoV-2 infection and should not be used as  the sole basis for treatment or other patient management decisions.  A negative result may occur with improper specimen collection / handling, submission of specimen other than nasopharyngeal swab, presence of viral mutation(s) within the areas  targeted by this assay, and inadequate number of viral copies (<250 copies / mL). A negative result must be combined with clinical observations, patient history, and epidemiological information.  Fact Sheet for Patients:   https://www.patel.info/  Fact Sheet for Healthcare Providers: https://hall.com/  This test is not yet approved or  cleared by the [place] FDA and has been authorized for detection and/or diagnosis of SARS-CoV-2 by FDA under an Emergency Use Authorization (EUA).  This EUA will remain in effect (meaning this test can be used) for the duration of the COVID-19 declaration under Section 564(b)(1) of the Act, 21 U.S.C. section 360bbb-3(b)(1), unless the authorization is terminated or revoked sooner.  Performed at [facility], [address] [place], [place] [address]      Radiological Exams on Admission: CT Angio Chest PE W and/or Wo Contrast  Result Date: 12/31/2021 CLINICAL DATA:  Pulmonary embolism (PE) suspected, high prob EXAM: CT ANGIOGRAPHY CHEST WITH CONTRAST TECHNIQUE: Multidetector CT imaging of the chest was performed using the standard protocol during bolus administration of intravenous contrast. Multiplanar CT image reconstructions and MIPs were obtained to evaluate the vascular anatomy. RADIATION DOSE REDUCTION: This exam was performed according to the departmental dose-optimization program which includes automated exposure control, adjustment of the mA and/or kV according to patient size and/or use of iterative reconstruction technique. CONTRAST:  40mL OMNIPAQUE IOHEXOL 350 MG/ML SOLN COMPARISON:  CT chest 02/13/2015. FINDINGS: Cardiovascular: No evidence of  pulmonary embolism to the segmental level. More distal evaluation is limited by motion Mediastinum/Nodes: No enlarged mediastinal, hilar, or axillary lymph nodes. Thyroid gland, trachea, and esophagus demonstrate no significant findings. Lungs/Pleura: Postsurgical changes of prior right upper lobectomy. No consolidation. No pleural effusions or pneumothorax. Upper Abdomen: No acute abnormality. Musculoskeletal: No chest wall abnormality. No acute or significant osseous findings. Review of the MIP images confirms the above findings. IMPRESSION: 1. No evidence of pulmonary embolism to the segmental level. More distal evaluation limited by motion. 2. Right upper lobectomy.  Clear lungs. Electronically Signed   By: Margaretha Sheffield M.D.   On: 12/31/2021 15:41   DG Chest Portable 1 View  Result Date: 12/31/2021 CLINICAL DATA:  Shortness of breath EXAM: PORTABLE CHEST 1 VIEW COMPARISON:  Chest x-ray May 10, 2021 FINDINGS: The cardiomediastinal silhouette is unchanged in contour. Stable postsurgical changes in the right upper lung. No focal pulmonary opacity. No pleural effusion or pneumothorax. Visualized upper abdomen is unremarkable. No acute osseous abnormality. IMPRESSION: Stable postsurgical changes in the right upper lung. No acute cardiopulmonary abnormality. Electronically Signed   By: Beryle Flock M.D.   On: 12/31/2021 14:45      Assessment/Plan Principal Problem:   COPD exacerbation (HCC) Active Problems:   Sepsis ([place])   Dyslipidemia   Tobacco use disorder   Anxiety and depression   Migraine headache   Assessment and Plan:  COPD exacerbation and sepsis due to COPD exacerbation: Chest x-ray negative.  CTA negative for PE.  Patient does not have wheezing or rhonchi on auscultation, but patient has productive cough, decreased air movement on auscultation, indicating possible COPD exacerbation.  Patient has 1 L new oxygen requirement.  Patient meets criteria for sepsis with heart rate  95, RR 22.  Pending lactic acid level.  Negative COVID PCR.   - will admit to tele bed as inpatient -Bronchodilators -Solu-Medrol 40 mg IV bid -Z pak  -Doxycycline 100 mg twice daily -Mucinex for cough  -Incentive spirometry -sputum culture -Nasal cannula oxygen as needed to maintain O2 saturation  93% or greater -will get Procalcitonin and trend lactic acid levels per sepsis protocol. -IVF: if lactic acid is elevated, will give normal saline bolus.  Dyslipidemia -lipitor  Tobacco use disorder -Counseling about importance of quitting smoking -Nicotine patch  Anxiety and depression - Continue home medications  Migraine headache: CT-head negative -As needed Tylenol and sumatriptan        DVT ppx: SQ Lovenox  Code Status: Full code  Family Communication: Yes, patient's husband and daughter   at bed side.      Disposition Plan:  Anticipate discharge back to previous environment  Consults called:  none  Admission status and Level of care: Telemetry Medical:  as inpt        Dispo: The patient is from: Home              Anticipated d/c is to: Home              Anticipated d/c date is: 2 days              Patient currently is not medically stable to d/c.    Severity of Illness:  The appropriate patient status for this patient is INPATIENT. Inpatient status is judged to be reasonable and necessary in order to provide the required intensity of service to ensure the patient's safety. The patient's presenting symptoms, physical exam findings, and initial radiographic and laboratory data in the context of their chronic comorbidities is felt to place them at high risk for further clinical deterioration. Furthermore, it is not anticipated that the patient will be medically stable for discharge from the hospital within 2 midnights of admission.   * I certify that at the point of admission it is my clinical judgment that the patient will require inpatient hospital care  spanning beyond 2 midnights from the point of admission due to high intensity of service, high risk for further deterioration and high frequency of surveillance required.*       Date of Service 12/31/2021    Ivor Costa [facility]   If 7PM-7AM, please contact night-coverage [email or web address] 12/31/2021, 5:19 PM

## 2021-12-31 NOTE — ED Triage Notes (Signed)
Pt arrives via EMS from home with resp distress. Pt has been feeling SOB for the last week. Pt sts that her home neb tx are not working. Pt did have a recent surgery than her increased SOB started.

## 2022-01-01 ENCOUNTER — Other Ambulatory Visit: Payer: Self-pay

## 2022-01-01 ENCOUNTER — Encounter: Payer: Self-pay | Admitting: Internal Medicine

## 2022-01-01 DIAGNOSIS — J441 Chronic obstructive pulmonary disease with (acute) exacerbation: Secondary | ICD-10-CM | POA: Diagnosis not present

## 2022-01-01 DIAGNOSIS — J9601 Acute respiratory failure with hypoxia: Secondary | ICD-10-CM | POA: Diagnosis not present

## 2022-01-01 DIAGNOSIS — F17200 Nicotine dependence, unspecified, uncomplicated: Secondary | ICD-10-CM | POA: Diagnosis not present

## 2022-01-01 LAB — LACTIC ACID, PLASMA
Lactic Acid, Venous: 2.1 mmol/L (ref 0.5–1.9)
Lactic Acid, Venous: 1.1 mmol/L (ref 0.5–1.9)

## 2022-01-01 LAB — RESPIRATORY PANEL BY PCR

## 2022-01-01 MED ORDER — SODIUM CHLORIDE 0.9 % IV SOLN: INTRAVENOUS | Status: DC

## 2022-01-01 MED ORDER — FLUTICASONE FUROATE-VILANTEROL 200-25 MCG/ACT IN AEPB
1.0000 | INHALATION_SPRAY | Freq: Every day | RESPIRATORY_TRACT | Status: DC
  Administered 2022-01-01 – 2022-01-03 (×3): 1 via RESPIRATORY_TRACT
  Filled 2022-01-01: qty 28

## 2022-01-01 MED ORDER — SODIUM CHLORIDE 0.9 % IV BOLUS
1000.0000 mL | Freq: Once | INTRAVENOUS | Status: AC
  Administered 2022-01-01: 1000 mL via INTRAVENOUS

## 2022-01-01 MED ORDER — IPRATROPIUM-ALBUTEROL 0.5-2.5 (3) MG/3ML IN SOLN
3.0000 mL | Freq: Two times a day (BID) | RESPIRATORY_TRACT | Status: DC
  Administered 2022-01-02 – 2022-01-03 (×3): 3 mL via RESPIRATORY_TRACT
  Filled 2022-01-01 (×3): qty 3

## 2022-01-01 NOTE — Progress Notes (Signed)
  Progress Note   Patient: Debra Mccann HQR:[id number] DOB: [birth year] DOA: 12/31/2021     1 DOS: the patient was seen and examined on 01/01/2022   Brief hospital course: Debra Mccann is a 66 y.o. female with medical history significant of COPD not on oxygen normally, hyperlipidemia, asthma, GERD, depression with anxiety, tobacco abuse, migraine headaches, lung cancer of RUL 2004 (s/p of lobectomy, chemotherapy), recent surgery for incarcerated hernia on 10/31, who presents with shortness breath.  She is diagnosed with COPD the patient was given IV steroids.  Assessment and Plan: COPD exacerbation. Acute hypoxemic respiratory failure. Tobacco abuse. Based on history, patient symptoms started about 10 to 14 days ago, when she had a worsening shortness of breath and cough with white mucus.  She did not have paroxysmal nocturnal dyspnea or orthopnea.  She she not have any weight gain.  No leg edema. Examination showed a significant decreased breathing sounds with air of little air movement.  Condition is consistent with COPD exacerbation.  Her oxygen saturation dropped down to 89%, she was placed on 2 L oxygen. We will check respiratory pathogen panel. At this point, we will continue steroids. Patient is strongly encouraged to quit smoking.  Continue nicotine patch.  Migraine headache.   Anxiety depression. Continue home medicines.     Subjective:  Patient feels better today, still has significant cough with large amount of white mucus.  Shortness of breath with exertion.  Physical Exam: Vitals:   01/01/22 0743 01/01/22 0800 01/01/22 1000 01/01/22 1227  BP: (!) 155/70 124/60 118/61 114/62  Pulse: 92 89 80 79  Resp: 18 18 18 16   Temp:  97.9 F (36.6 C)  98 F (36.7 C)  TempSrc:  Oral  Oral  SpO2: 97% 97% 96% 97%  Weight:      Height:       General exam: Appears calm and comfortable  Respiratory system: Significant decreased breathing sounds with little air  movement. Cardiovascular system: S1 & S2 heard, RRR. No JVD, murmurs, rubs, gallops or clicks. No pedal edema. Gastrointestinal system: Abdomen is nondistended, soft and nontender. No organomegaly or masses felt. Normal bowel sounds heard. Central nervous system: Alert and oriented. No focal neurological deficits. Extremities: Symmetric 5 x 5 power. Skin: No rashes, lesions or ulcers Psychiatry: Judgement and insight appear normal. Mood & affect appropriate.   Data Reviewed:  Reviewed CT scan, chest x-ray.  Family Communication: Friend at the bedside.  Disposition: Status is: Inpatient Remains inpatient appropriate because: Severity of disease, IV steroids.  Planned Discharge Destination: Home    Time spent: 35 minutes  Author: Sharen Hones, MD 01/01/2022 3:00 PM  For on call review [email or web address].

## 2022-01-01 NOTE — Hospital Course (Signed)
Debra Mccann is a 66 y.o. female with medical history significant of COPD not on oxygen normally, hyperlipidemia, asthma, GERD, depression with anxiety, tobacco abuse, migraine headaches, lung cancer of RUL 2004 (s/p of lobectomy, chemotherapy), recent surgery for incarcerated hernia on 10/31, who presents with shortness breath.  She is diagnosed with COPD the patient was given IV steroids.

## 2022-01-01 NOTE — Anesthesia Postprocedure Evaluation (Signed)
Anesthesia Post Note  Patient: Debra Mccann  Procedure(s) Performed: HERNIA REPAIR VENTRAL ADULT, open INSERTION OF MESH  Patient location during evaluation: PACU Anesthesia Type: General Level of consciousness: awake and alert Pain management: pain level controlled Vital Signs Assessment: post-procedure vital signs reviewed and stable Respiratory status: spontaneous breathing, nonlabored ventilation, respiratory function stable and patient connected to nasal cannula oxygen Cardiovascular status: blood pressure returned to baseline and stable Postop Assessment: no apparent nausea or vomiting Anesthetic complications: no   No notable events documented.   Last Vitals:  Vitals:   12/25/21 1420 12/25/21 1433  BP: 130/67 133/61  Pulse: 84 80  Resp: 16 18  Temp:  36.4 C  SpO2: 92% 92%    Last Pain:  Vitals:   12/25/21 1433  TempSrc: Temporal  PainSc: 0-No pain                 Martha Clan

## 2022-01-02 DIAGNOSIS — J441 Chronic obstructive pulmonary disease with (acute) exacerbation: Secondary | ICD-10-CM | POA: Diagnosis not present

## 2022-01-02 LAB — EXPECTORATED SPUTUM ASSESSMENT W GRAM STAIN, RFLX TO RESP C

## 2022-01-02 NOTE — Progress Notes (Signed)
PROGRESS NOTE Debra Mccann  WPY:[id number] DOB: [birth year] DOA: 12/31/2021 PCP: Danelle Berry, NP   Brief Narrative/Hospital Course: 66 y.o. female with medical history significant of COPD not on oxygen normally, hyperlipidemia, asthma, GERD, depression with anxiety, tobacco abuse, migraine headaches, lung cancer of RUL 2004 (s/p of lobectomy, chemotherapy), recent surgery for incarcerated hernia on 10/31, who presents with shortness breath.  Chest x-ray postsurgical changes, CT angio negative for PE.  Labs with mild lactic acidosis, admitted for acute respiratory failure with COPD exacerbation    Subjective: Seen and examined. Mild cough Was on oxygen at 2l  Assessment and Plan: Principal Problem:   COPD exacerbation ([place]) Active Problems:   Acute hypoxemic respiratory failure (HCC)   History of primary non-small cell carcinoma of right lung s/p RUL resection 2004   Dyslipidemia   Tobacco use disorder   Anxiety and depression   Migraine headache   Acute COPD exacerbation. Acute hypoxemic respiratory failure. Tobacco abuse: Symptom onset 10 to 14 days.  CT angio chest no acute finding.  Continue with Solu-Medrol IV, Zithromax, Breo Ellipta, DuoNeb, Claritin nicotine patch.  Continue supplemental oxygen and wean as tolerated encourage PT OT. Fu RVP  Lactic acidosis in the setting of hypoxia resolved.  Sepsis rule out HLD continue Lipitor  Migraine headache   Anxiety depression: Continue home Cymbalta, B12  DVT prophylaxis: enoxaparin (LOVENOX) injection 40 mg Start: 12/31/21 1700 Code Status:   Code Status: Full Code Family Communication: plan of care discussed with patient at bedside. Patient status is: Inpatient because of COPD exacerbation Level of care: Med-Surg   Dispo: The patient is from: home            Anticipated disposition: home tomorrow likely Objective: Vitals last 24 hrs: Vitals:   01/02/22 0114 01/02/22 0508 01/02/22 0742 01/02/22 0753  BP:  119/63 (!) 149/76  (!) 125/57  Pulse: 72 73  74  Resp: 20 18  18   Temp: 97.8 F (36.6 C) 98 F (36.7 C)  97.9 F (36.6 C)  TempSrc:  Oral  Oral  SpO2: 97% 95% 95% 98%  Weight:      Height:       Weight change:   Physical Examination: General exam: alert awake, older than stated age HEENT:Oral mucosa moist, Ear/Nose WNL grossly Respiratory system: bilaterally diminished BS, no use of accessory muscle Cardiovascular system: S1 & S2 +, No JVD. Gastrointestinal system: Abdomen soft,NT,ND, BS+ Nervous System:Alert, awake, moving extremities. Extremities: LE edema neg,distal peripheral pulses palpable.  Skin: No rashes,no icterus. MSK: Normal muscle bulk,tone, power  Medications reviewed:  Scheduled Meds:  atorvastatin  40 mg Oral QHS   azithromycin  250 mg Oral Daily   cholecalciferol  500 Units Oral Daily   DULoxetine  30 mg Oral Daily   enoxaparin (LOVENOX) injection  40 mg Subcutaneous Q24H   fluticasone furoate-vilanterol  1 puff Inhalation Daily   ipratropium-albuterol  3 mL Nebulization BID   loratadine  10 mg Oral Daily   methylPREDNISolone (SOLU-MEDROL) injection  80 mg Intravenous Q24H   nicotine  21 mg Transdermal Daily   pantoprazole  40 mg Oral Daily   vitamin B-12  100 mcg Oral Daily  Continuous Infusions:   Diet Order             Diet Heart Room service appropriate? Yes; Fluid consistency: Thin  Diet effective now                  Intake/Output Summary (Last 24 hours)  at 01/02/2022 1123 Last data filed at 01/01/2022 1548 Gross per 24 hour  Intake 473.75 ml  Output --  Net 473.75 ml   Net IO Since Admission: 473.75 mL [01/02/22 1123]  Wt Readings from Last 3 Encounters:  12/31/21 70.3 kg  12/25/21 69.9 kg  12/24/21 70.8 kg     Unresulted Labs (From admission, onward)     Start     Ordered   01/01/22 1756  Expectorated Sputum Assessment w Gram Stain, Rflx to Resp Cult  Once,   R        01/01/22 1755          Data Reviewed: I have  personally reviewed following labs and imaging studies CBC: Recent Labs  Lab 12/31/21 1407  WBC 6.8  NEUTROABS 4.6  HGB 14.5  HCT 44.3  MCV 86.5  PLT 694   Basic Metabolic Panel: Recent Labs  Lab 12/31/21 1407  NA 142  K 3.8  CL 106  CO2 28  GLUCOSE 127*  BUN 7*  CREATININE 0.69  CALCIUM 8.9   GFR: Estimated Creatinine Clearance: 69.9 mL/min (by C-G formula based on SCr of 0.69 mg/dL). Liver Function Tests: Recent Results (from the past 240 hour(s))  SARS Coronavirus 2 by RT PCR (hospital order, performed in [facility] hospital lab) *cepheid single result test* Anterior Nasal Swab     Status: None   Collection Time: 12/31/21  2:07 PM   Specimen: Anterior Nasal Swab  Result Value Ref Range Status   SARS Coronavirus 2 by RT PCR NEGATIVE NEGATIVE Final    Comment: (NOTE) SARS-CoV-2 target nucleic acids are NOT DETECTED.  The SARS-CoV-2 RNA is generally detectable in upper and lower respiratory specimens during the acute phase of infection. The lowest concentration of SARS-CoV-2 viral copies this assay can detect is 250 copies / mL. A negative result does not preclude SARS-CoV-2 infection and should not be used as the sole basis for treatment or other patient management decisions.  A negative result may occur with improper specimen collection / handling, submission of specimen other than nasopharyngeal swab, presence of viral mutation(s) within the areas targeted by this assay, and inadequate number of viral copies (<250 copies / mL). A negative result must be combined with clinical observations, patient history, and epidemiological information.  Fact Sheet for Patients:   https://www.patel.info/  Fact Sheet for Healthcare Providers: https://hall.com/  This test is not yet approved or  cleared by the [place] FDA and has been authorized for detection and/or diagnosis of SARS-CoV-2 by FDA under an Emergency Use  Authorization (EUA).  This EUA will remain in effect (meaning this test can be used) for the duration of the COVID-19 declaration under Section 564(b)(1) of the Act, 21 U.S.C. section 360bbb-3(b)(1), unless the authorization is terminated or revoked sooner.  Performed at [facility], [place], [place] [address]   Respiratory (~20 pathogens) panel by PCR     Status: None   Collection Time: 01/01/22  3:00 PM   Specimen: Nasopharyngeal Swab; Respiratory  Result Value Ref Range Status   Adenovirus NOT DETECTED NOT DETECTED Final   Coronavirus 229E NOT DETECTED NOT DETECTED Final    Comment: (NOTE) The Coronavirus on the Respiratory Panel, DOES NOT test for the novel  Coronavirus (2019 nCoV)    Coronavirus HKU1 NOT DETECTED NOT DETECTED Final   Coronavirus NL63 NOT DETECTED NOT DETECTED Final   Coronavirus OC43 NOT DETECTED NOT DETECTED Final   Metapneumovirus NOT DETECTED NOT DETECTED  Final   Rhinovirus / Enterovirus NOT DETECTED NOT DETECTED Final   Influenza A NOT DETECTED NOT DETECTED Final   Influenza B NOT DETECTED NOT DETECTED Final   Parainfluenza Virus 1 NOT DETECTED NOT DETECTED Final   Parainfluenza Virus 2 NOT DETECTED NOT DETECTED Final   Parainfluenza Virus 3 NOT DETECTED NOT DETECTED Final   Parainfluenza Virus 4 NOT DETECTED NOT DETECTED Final   Respiratory Syncytial Virus NOT DETECTED NOT DETECTED Final   Bordetella pertussis NOT DETECTED NOT DETECTED Final   Bordetella Parapertussis NOT DETECTED NOT DETECTED Final   Chlamydophila pneumoniae NOT DETECTED NOT DETECTED Final   Mycoplasma pneumoniae NOT DETECTED NOT DETECTED Final    Comment: Performed at [facility], [place] [address] [place], [place] [address]    Antimicrobials: Anti-infectives (From admission, onward)    Start     Dose/Rate Route Frequency Ordered Stop   01/01/22 1000  azithromycin (ZITHROMAX) tablet 250 mg       See Hyperspace for full Linked Orders Report.   250  mg Oral Daily 12/31/21 1646 01/05/22 0959   12/31/21 1700  azithromycin (ZITHROMAX) tablet 500 mg       See Hyperspace for full Linked Orders Report.   500 mg Oral Daily 12/31/21 1646 12/31/21 1758      Culture/Microbiology No results found for: "SDES", "SPECREQUEST", "CULT", "REPTSTATUS"  Other culture-see note  Radiology Studies: CT Angio Chest PE W and/or Wo Contrast  Result Date: 12/31/2021 CLINICAL DATA:  Pulmonary embolism (PE) suspected, high prob EXAM: CT ANGIOGRAPHY CHEST WITH CONTRAST TECHNIQUE: Multidetector CT imaging of the chest was performed using the standard protocol during bolus administration of intravenous contrast. Multiplanar CT image reconstructions and MIPs were obtained to evaluate the vascular anatomy. RADIATION DOSE REDUCTION: This exam was performed according to the departmental dose-optimization program which includes automated exposure control, adjustment of the mA and/or kV according to patient size and/or use of iterative reconstruction technique. CONTRAST:  44mL OMNIPAQUE IOHEXOL 350 MG/ML SOLN COMPARISON:  CT chest 02/13/2015. FINDINGS: Cardiovascular: No evidence of pulmonary embolism to the segmental level. More distal evaluation is limited by motion Mediastinum/Nodes: No enlarged mediastinal, hilar, or axillary lymph nodes. Thyroid gland, trachea, and esophagus demonstrate no significant findings. Lungs/Pleura: Postsurgical changes of prior right upper lobectomy. No consolidation. No pleural effusions or pneumothorax. Upper Abdomen: No acute abnormality. Musculoskeletal: No chest wall abnormality. No acute or significant osseous findings. Review of the MIP images confirms the above findings. IMPRESSION: 1. No evidence of pulmonary embolism to the segmental level. More distal evaluation limited by motion. 2. Right upper lobectomy.  Clear lungs. Electronically Signed   By: Margaretha Sheffield M.D.   On: 12/31/2021 15:41   DG Chest Portable 1 View  Result Date:  12/31/2021 CLINICAL DATA:  Shortness of breath EXAM: PORTABLE CHEST 1 VIEW COMPARISON:  Chest x-ray May 10, 2021 FINDINGS: The cardiomediastinal silhouette is unchanged in contour. Stable postsurgical changes in the right upper lung. No focal pulmonary opacity. No pleural effusion or pneumothorax. Visualized upper abdomen is unremarkable. No acute osseous abnormality. IMPRESSION: Stable postsurgical changes in the right upper lung. No acute cardiopulmonary abnormality. Electronically Signed   By: Beryle Flock M.D.   On: 12/31/2021 14:45     LOS: 2 days   Antonieta Pert, MD [facility]  01/02/2022, 11:23 AM

## 2022-01-02 NOTE — TOC Initial Note (Signed)
Transition of Care [facility]) - Initial/Assessment Note    Patient Details  Name: Debra Mccann MRN: [id number] Date of Birth: February 19, 1956  Transition of Care [facility]) CM/SW Contact:    Laurena Slimmer, RN Phone Number: 01/02/2022, 11:32 AM  Clinical Narrative:                  Transition of Care [facility]) Screening Note   Patient Details  Name: Debra Mccann Date of Birth: 02-14-1956   Transition of Care [facility]) CM/SW Contact:    Laurena Slimmer, RN Phone Number: 01/02/2022, 11:32 AM    Transition of Care Department [facility]) has reviewed patient and no TOC needs have been identified at this time. We will continue to monitor patient advancement through interdisciplinary progression rounds. If new patient transition needs arise, please place a TOC consult.          Patient Goals and CMS Choice        Expected Discharge Plan and Services                                                Prior Living Arrangements/Services                       Activities of Daily Living Home Assistive Devices/Equipment: Eyeglasses ADL Screening (condition at time of admission) Patient's cognitive ability adequate to safely complete daily activities?: Yes Is the patient deaf or have difficulty hearing?: No Does the patient have difficulty seeing, even when wearing glasses/contacts?: No Does the patient have difficulty concentrating, remembering, or making decisions?: No Patient able to express need for assistance with ADLs?: Yes Does the patient have difficulty dressing or bathing?: No Independently performs ADLs?: Yes (appropriate for developmental age) Does the patient have difficulty walking or climbing stairs?: No Weakness of Legs: None Weakness of Arms/Hands: None  Permission Sought/Granted                  Emotional Assessment              Admission diagnosis:  COPD exacerbation ([place]) [J44.1] Acute respiratory failure with hypoxia ([place])  [J96.01] Patient Active Problem List   Diagnosis Date Noted   Migraine headache 12/31/2021   Incarcerated ventral hernia    SOB (shortness of breath)    COPD exacerbation ([place]) 05/11/2021   COPD with acute bronchitis ([place]) 05/10/2021   Acute hypoxemic respiratory failure ([place]) 05/10/2021   Anxiety and depression 05/10/2021   Ganglion cyst of volar aspect of left wrist 08/23/2020   Dyslipidemia 10/24/2016   LGSIL on Pap smear of cervix 10/24/2016   Cervical high risk HPV (human papillomavirus) test positive 10/24/2016   HPV (human papilloma virus) infection 10/22/2016   Preventative health care 05/23/2016   Obesity, Class II, BMI 35-39.9, no comorbidity 05/23/2016   Odynophagia 02/22/2016   Food sticks on swallowing 02/22/2016   Weight gain 01/30/2016   Fatigue 01/30/2016   Erythrocytosis 06/15/2015   Snoring 06/15/2015   Calcium oxalate crystals in urine 06/14/2015   Elevated red blood cell count 06/14/2015   Emphysema/COPD ([place]) 01/30/2015   Night sweats 01/30/2015   Cough 01/30/2015   Tobacco use disorder 12/09/2014   Vitamin D deficiency 11/18/2014   Major depressive disorder, recurrent episode, moderate with anxious distress ([place])    COPD (chronic obstructive pulmonary disease) ([place])  Urticaria    OA (osteoarthritis) of hip    History of primary non-small cell carcinoma of right lung s/p RUL resection 2004    Osteopenia 08/08/2014   PCP:  Danelle Berry, NP Pharmacy:   [id number] [address], [place] [address] Phone: [phone] Fax: [phone]  [id number] - [place], [place] - [address] [place] [address] Phone: [phone] Fax: [phone]     Social Determinants of Health (SDOH) Interventions    Readmission Risk Interventions     No data to display

## 2022-01-03 DIAGNOSIS — J441 Chronic obstructive pulmonary disease with (acute) exacerbation: Secondary | ICD-10-CM | POA: Diagnosis not present

## 2022-01-03 MED ORDER — AZITHROMYCIN 250 MG PO TABS: 250.0000 mg | ORAL_TABLET | Freq: Every day | ORAL | 0 refills | Status: AC

## 2022-01-03 MED ORDER — INCRUSE ELLIPTA 62.5 MCG/ACT IN AEPB: 1.0000 | INHALATION_SPRAY | Freq: Every day | RESPIRATORY_TRACT | 0 refills | Status: AC

## 2022-01-03 MED ORDER — FLUTICASONE FUROATE-VILANTEROL 200-25 MCG/ACT IN AEPB: 1.0000 | INHALATION_SPRAY | Freq: Every day | RESPIRATORY_TRACT | 0 refills | Status: AC

## 2022-01-03 MED ORDER — PREDNISONE 10 MG PO TABS: ORAL_TABLET | ORAL | 0 refills | Status: DC

## 2022-01-03 NOTE — Discharge Summary (Signed)
Physician Discharge Summary  Debra Mccann GGY:[id number] DOB: October 07, 1955 DOA: 12/31/2021  PCP: Debra Berry, NP  Admit date: 12/31/2021 Discharge date: 01/03/2022 Recommendations for Outpatient Follow-up:  Follow up with PCP in 1 weeks-call for appointment Follow-up with Dr. Raul Mccann from pulmonary Please obtain BMP/CBC in one week  Discharge Dispo: home Discharge Condition: Stable Code Status:   Code Status: Full Code Diet recommendation:  Diet Order             Diet Heart Room service appropriate? Yes; Fluid consistency: Thin  Diet effective now                    Brief/Interim Summary: 66 y.o. female with medical history significant of COPD not on oxygen normally, hyperlipidemia, asthma, GERD, depression with anxiety, tobacco abuse, migraine headaches, lung cancer of RUL 2004 (s/p of lobectomy, chemotherapy), recent surgery for incarcerated hernia on 10/31, who presents with shortness breath.  Chest x-ray postsurgical changes, CT angio negative for PE.  Labs with mild lactic acidosis, admitted for acute respiratory failure with COPD exacerbation. Patient is clinically improved with systemic steroids bronchodilators.  Able to wean off oxygen and on room air. She feels much improved and feels ready for discharge home today .   Discharge Diagnoses:  Principal Problem:   COPD exacerbation ([place]) Active Problems:   Acute hypoxemic respiratory failure (HCC)   History of primary non-small cell carcinoma of right lung s/p RUL resection 2004   Dyslipidemia   Tobacco use disorder   Anxiety and depression   Migraine headache  Acute COPD exacerbation. Acute hypoxemic respiratory failure. Tobacco abuse: Symptom onset 10 to 14 days.  CT angio chest no acute finding.  Treated with Solu-Medrol IV, Zithromax, Breo Ellipta, DuoNeb, Claritin nicotine patch.  Clinically improved off oxygen.  Will discharge on oral steroid along with Breo/Incruse, advised to follow-up with Dr.  Raul Mccann soon.   Lactic acidosis in the setting of hypoxia resolved.  Sepsis rule out HLD continue Lipitor Migraine headache   Anxiety depression: Continue home Cymbalta, B12  Consults: None Subjective: Alert awake, resting well, on RA. No new complaints Feels improved and ready for discharge home today  Discharge Exam: Vitals:   01/03/22 0742 01/03/22 0854  BP:  135/72  Pulse:  67  Resp:  16  Temp:  97.7 F (36.5 C)  SpO2: 93% 92%   General: Pt is alert, awake, not in acute distress Cardiovascular: RRR, S1/S2 +, no rubs, no gallops Respiratory: CTA bilaterally, no wheezing, no rhonchi Abdominal: Soft, NT, ND, bowel sounds + Extremities: no edema, no cyanosis  Discharge Instructions  Discharge Instructions     Discharge instructions   Complete by: As directed    Please call call MD or return to ER for similar or worsening recurring problem that brought you to hospital or if any fever,nausea/vomiting,abdominal pain, uncontrolled pain, chest pain,  shortness of breath or any other alarming symptoms.  Please follow-up your doctor as instructed in a week time and call the office for appointment.  Please avoid alcohol, smoking, or any other illicit substance and maintain healthy habits including taking your regular medications as prescribed.  You were cared for by a hospitalist during your hospital stay. If you have any questions about your discharge medications or the care you received while you were in the hospital after you are discharged, you can call the unit and ask to speak with the hospitalist on call if the hospitalist that took care of you is not  available.  Once you are discharged, your primary care physician will handle any further medical issues. Please note that NO REFILLS for any discharge medications will be authorized once you are discharged, as it is imperative that you return to your primary care physician (or establish a relationship with a primary care  physician if you do not have one) for your aftercare needs so that they can reassess your need for medications and monitor your lab values   Increase activity slowly   Complete by: As directed    No wound care   Complete by: As directed       Allergies as of 01/03/2022       Reactions   Penicillins Hives        Medication List     STOP taking these medications    HYDROcodone-acetaminophen 5-325 MG tablet Commonly known as: NORCO/VICODIN       TAKE these medications    albuterol 108 (90 Base) MCG/ACT inhaler Commonly known as: VENTOLIN HFA Inhale 2 puffs into the lungs every 6 (six) hours as needed.   atorvastatin 40 MG tablet Commonly known as: LIPITOR TAKE 1 TABLET BY MOUTH EVERY NIGHT AT BEDTIME   azithromycin 250 MG tablet Commonly known as: ZITHROMAX Take 1 tablet (250 mg total) by mouth daily for 3 days.   cetirizine 10 MG tablet Commonly known as: ZYRTEC Take 10 mg by mouth daily.   cholecalciferol 10 MCG (400 UNIT) Tabs tablet Commonly known as: VITAMIN D3 Take 400 Units by mouth daily.   DULoxetine 30 MG capsule Commonly known as: CYMBALTA Three pills by mouth daily   fluticasone furoate-vilanterol 200-25 MCG/ACT Aepb Commonly known as: BREO ELLIPTA Inhale 1 puff into the lungs daily.   Incruse Ellipta 62.5 MCG/ACT Aepb Generic drug: umeclidinium bromide Inhale 1 puff into the lungs daily.   ipratropium-albuterol 0.5-2.5 (3) MG/3ML Soln Commonly known as: DUONEB Inhale 3 mLs into the lungs every 6 (six) hours as needed.   omeprazole 40 MG capsule Commonly known as: PriLOSEC Take 1 capsule (40 mg total) by mouth daily.   predniSONE 10 MG tablet Commonly known as: DELTASONE Take PO 4 tabs daily x 2 days,3 tabs daily x 2 days,2 tabs daily x 2 days,1 tab daily x 2 days then stop.   traMADol 50 MG tablet Commonly known as: Ultram Take 1 tablet (50 mg total) by mouth every 6 (six) hours as needed.   vitamin B-12 100 MCG tablet Commonly  known as: CYANOCOBALAMIN Take 100 mcg by mouth daily.        Follow-up Information     Debra Berry, NP Follow up in 1 week(s).   Specialty: Nurse Practitioner Contact information: [place] [address] [phone]         Debra Pian, MD Follow up in 1 week(s).   Specialty: Specialist Contact information: [place] [address] [phone]                Allergies  Allergen Reactions   Penicillins Hives    The results of significant diagnostics from this hospitalization (including imaging, microbiology, ancillary and laboratory) are listed below for reference.    Microbiology: Recent Results (from the past 240 hour(s))  SARS Coronavirus 2 by RT PCR (hospital order, performed in [facility] hospital lab) *cepheid single result test* Anterior Nasal Swab     Status: None   Collection Time: 12/31/21  2:07 PM   Specimen:  Anterior Nasal Swab  Result Value Ref Range Status   SARS Coronavirus 2 by RT PCR NEGATIVE NEGATIVE Final    Comment: (NOTE) SARS-CoV-2 target nucleic acids are NOT DETECTED.  The SARS-CoV-2 RNA is generally detectable in upper and lower respiratory specimens during the acute phase of infection. The lowest concentration of SARS-CoV-2 viral copies this assay can detect is 250 copies / mL. A negative result does not preclude SARS-CoV-2 infection and should not be used as the sole basis for treatment or other patient management decisions.  A negative result may occur with improper specimen collection / handling, submission of specimen other than nasopharyngeal swab, presence of viral mutation(s) within the areas targeted by this assay, and inadequate number of viral copies (<250 copies / mL). A negative result must be combined with clinical observations, patient history, and epidemiological information.  Fact Sheet for Patients:    https://www.patel.info/  Fact Sheet for Healthcare Providers: https://hall.com/  This test is not yet approved or  cleared by the [place] FDA and has been authorized for detection and/or diagnosis of SARS-CoV-2 by FDA under an Emergency Use Authorization (EUA).  This EUA will remain in effect (meaning this test can be used) for the duration of the COVID-19 declaration under Section 564(b)(1) of the Act, 21 U.S.C. section 360bbb-3(b)(1), unless the authorization is terminated or revoked sooner.  Performed at [facility], [place], [place] [address]   Respiratory (~20 pathogens) panel by PCR     Status: None   Collection Time: 01/01/22  3:00 PM   Specimen: Nasopharyngeal Swab; Respiratory  Result Value Ref Range Status   Adenovirus NOT DETECTED NOT DETECTED Final   Coronavirus 229E NOT DETECTED NOT DETECTED Final    Comment: (NOTE) The Coronavirus on the Respiratory Panel, DOES NOT test for the novel  Coronavirus (2019 nCoV)    Coronavirus HKU1 NOT DETECTED NOT DETECTED Final   Coronavirus NL63 NOT DETECTED NOT DETECTED Final   Coronavirus OC43 NOT DETECTED NOT DETECTED Final   Metapneumovirus NOT DETECTED NOT DETECTED Final   Rhinovirus / Enterovirus NOT DETECTED NOT DETECTED Final   Influenza A NOT DETECTED NOT DETECTED Final   Influenza B NOT DETECTED NOT DETECTED Final   Parainfluenza Virus 1 NOT DETECTED NOT DETECTED Final   Parainfluenza Virus 2 NOT DETECTED NOT DETECTED Final   Parainfluenza Virus 3 NOT DETECTED NOT DETECTED Final   Parainfluenza Virus 4 NOT DETECTED NOT DETECTED Final   Respiratory Syncytial Virus NOT DETECTED NOT DETECTED Final   Bordetella pertussis NOT DETECTED NOT DETECTED Final   Bordetella Parapertussis NOT DETECTED NOT DETECTED Final   Chlamydophila pneumoniae NOT DETECTED NOT DETECTED Final   Mycoplasma pneumoniae NOT DETECTED NOT DETECTED Final    Comment: Performed at  [facility], [place]. [address], [place], [place] [address]  Expectorated Sputum Assessment w Gram Stain, Rflx to Resp Cult     Status: None   Collection Time: 01/02/22  2:00 PM   Specimen: Sputum  Result Value Ref Range Status   Specimen Description SPUTUM  Final   Special Requests EXPSU  Final   Sputum evaluation   Final    Sputum specimen not acceptable for testing.  Please recollect.   RESULT CALLED TO, READ BACK BY AND VERIFIED WITH: Westley Gambles 01/02/22 1453 MW Performed at [facility], [address] [place],  [address]    Report Status 01/02/2022 FINAL  Final    Procedures/Studies: CT Angio Chest PE W and/or  Wo Contrast  Result Date: 12/31/2021 CLINICAL DATA:  Pulmonary embolism (PE) suspected, high prob EXAM: CT ANGIOGRAPHY CHEST WITH CONTRAST TECHNIQUE: Multidetector CT imaging of the chest was performed using the standard protocol during bolus administration of intravenous contrast. Multiplanar CT image reconstructions and MIPs were obtained to evaluate the vascular anatomy. RADIATION DOSE REDUCTION: This exam was performed according to the departmental dose-optimization program which includes automated exposure control, adjustment of the mA and/or kV according to patient size and/or use of iterative reconstruction technique. CONTRAST:  65mL OMNIPAQUE IOHEXOL 350 MG/ML SOLN COMPARISON:  CT chest 02/13/2015. FINDINGS: Cardiovascular: No evidence of pulmonary embolism to the segmental level. More distal evaluation is limited by motion Mediastinum/Nodes: No enlarged mediastinal, hilar, or axillary lymph nodes. Thyroid gland, trachea, and esophagus demonstrate no significant findings. Lungs/Pleura: Postsurgical changes of prior right upper lobectomy. No consolidation. No pleural effusions or pneumothorax. Upper Abdomen: No acute abnormality. Musculoskeletal: No chest wall abnormality. No acute or significant osseous findings. Review of the MIP images confirms the above  findings. IMPRESSION: 1. No evidence of pulmonary embolism to the segmental level. More distal evaluation limited by motion. 2. Right upper lobectomy.  Clear lungs. Electronically Signed   By: Margaretha Sheffield M.D.   On: 12/31/2021 15:41   DG Chest Portable 1 View  Result Date: 12/31/2021 CLINICAL DATA:  Shortness of breath EXAM: PORTABLE CHEST 1 VIEW COMPARISON:  Chest x-ray May 10, 2021 FINDINGS: The cardiomediastinal silhouette is unchanged in contour. Stable postsurgical changes in the right upper lung. No focal pulmonary opacity. No pleural effusion or pneumothorax. Visualized upper abdomen is unremarkable. No acute osseous abnormality. IMPRESSION: Stable postsurgical changes in the right upper lung. No acute cardiopulmonary abnormality. Electronically Signed   By: Beryle Flock M.D.   On: 12/31/2021 14:45    Labs: BNP (last 3 results) No results for input(s): "BNP" in the last 8760 hours. Basic Metabolic Panel: Recent Labs  Lab 12/31/21 1407  NA 142  K 3.8  CL 106  CO2 28  GLUCOSE 127*  BUN 7*  CREATININE 0.69  CALCIUM 8.9   Liver Function Tests: Recent Labs  Lab 12/31/21 1407  AST 15  ALT 10  ALKPHOS 40  BILITOT 0.6  PROT 6.3*  ALBUMIN 3.5   No results for input(s): "LIPASE", "AMYLASE" in the last 168 hours. No results for input(s): "AMMONIA" in the last 168 hours. CBC: Recent Labs  Lab 12/31/21 1407  WBC 6.8  NEUTROABS 4.6  HGB 14.5  HCT 44.3  MCV 86.5  PLT 265   Cardiac Enzymes: No results for input(s): "CKTOTAL", "CKMB", "CKMBINDEX", "TROPONINI" in the last 168 hours. BNP: Invalid input(s): "POCBNP" CBG: No results for input(s): "GLUCAP" in the last 168 hours. D-Dimer No results for input(s): "DDIMER" in the last 72 hours. Hgb A1c No results for input(s): "HGBA1C" in the last 72 hours. Lipid Profile No results for input(s): "CHOL", "HDL", "LDLCALC", "TRIG", "CHOLHDL", "LDLDIRECT" in the last 72 hours. Thyroid function studies No results for  input(s): "TSH", "T4TOTAL", "T3FREE", "THYROIDAB" in the last 72 hours.  Invalid input(s): "FREET3" Anemia work up No results for input(s): "VITAMINB12", "FOLATE", "FERRITIN", "TIBC", "IRON", "RETICCTPCT" in the last 72 hours. Urinalysis    Component Value Date/Time   COLORURINE YELLOW (A) 12/19/2021 1351   APPEARANCEUR HAZY (A) 12/19/2021 1351   APPEARANCEUR Cloudy (A) 06/08/2015 0941   LABSPEC 1.025 12/19/2021 1351   PHURINE 5.0 12/19/2021 1351   GLUCOSEU NEGATIVE 12/19/2021 1351   HGBUR NEGATIVE 12/19/2021 1351   BILIRUBINUR NEGATIVE  12/19/2021 1351   BILIRUBINUR Negative 06/08/2015 0941   KETONESUR 5 (A) 12/19/2021 1351   PROTEINUR 30 (A) 12/19/2021 1351   NITRITE NEGATIVE 12/19/2021 1351   LEUKOCYTESUR NEGATIVE 12/19/2021 1351   Sepsis Labs Recent Labs  Lab 12/31/21 1407  WBC 6.8   Microbiology Recent Results (from the past 240 hour(s))  SARS Coronavirus 2 by RT PCR (hospital order, performed in [facility] hospital lab) *cepheid single result test* Anterior Nasal Swab     Status: None   Collection Time: 12/31/21  2:07 PM   Specimen: Anterior Nasal Swab  Result Value Ref Range Status   SARS Coronavirus 2 by RT PCR NEGATIVE NEGATIVE Final    Comment: (NOTE) SARS-CoV-2 target nucleic acids are NOT DETECTED.  The SARS-CoV-2 RNA is generally detectable in upper and lower respiratory specimens during the acute phase of infection. The lowest concentration of SARS-CoV-2 viral copies this assay can detect is 250 copies / mL. A negative result does not preclude SARS-CoV-2 infection and should not be used as the sole basis for treatment or other patient management decisions.  A negative result may occur with improper specimen collection / handling, submission of specimen other than nasopharyngeal swab, presence of viral mutation(s) within the areas targeted by this assay, and inadequate number of viral copies (<250 copies / mL). A negative result must be combined with  clinical observations, patient history, and epidemiological information.  Fact Sheet for Patients:   https://www.patel.info/  Fact Sheet for Healthcare Providers: https://hall.com/  This test is not yet approved or  cleared by the [place] FDA and has been authorized for detection and/or diagnosis of SARS-CoV-2 by FDA under an Emergency Use Authorization (EUA).  This EUA will remain in effect (meaning this test can be used) for the duration of the COVID-19 declaration under Section 564(b)(1) of the Act, 21 U.S.C. section 360bbb-3(b)(1), unless the authorization is terminated or revoked sooner.  Performed at [facility], [place], [place] [address]   Respiratory (~20 pathogens) panel by PCR     Status: None   Collection Time: 01/01/22  3:00 PM   Specimen: Nasopharyngeal Swab; Respiratory  Result Value Ref Range Status   Adenovirus NOT DETECTED NOT DETECTED Final   Coronavirus 229E NOT DETECTED NOT DETECTED Final    Comment: (NOTE) The Coronavirus on the Respiratory Panel, DOES NOT test for the novel  Coronavirus (2019 nCoV)    Coronavirus HKU1 NOT DETECTED NOT DETECTED Final   Coronavirus NL63 NOT DETECTED NOT DETECTED Final   Coronavirus OC43 NOT DETECTED NOT DETECTED Final   Metapneumovirus NOT DETECTED NOT DETECTED Final   Rhinovirus / Enterovirus NOT DETECTED NOT DETECTED Final   Influenza A NOT DETECTED NOT DETECTED Final   Influenza B NOT DETECTED NOT DETECTED Final   Parainfluenza Virus 1 NOT DETECTED NOT DETECTED Final   Parainfluenza Virus 2 NOT DETECTED NOT DETECTED Final   Parainfluenza Virus 3 NOT DETECTED NOT DETECTED Final   Parainfluenza Virus 4 NOT DETECTED NOT DETECTED Final   Respiratory Syncytial Virus NOT DETECTED NOT DETECTED Final   Bordetella pertussis NOT DETECTED NOT DETECTED Final   Bordetella Parapertussis NOT DETECTED NOT DETECTED Final   Chlamydophila pneumoniae NOT  DETECTED NOT DETECTED Final   Mycoplasma pneumoniae NOT DETECTED NOT DETECTED Final    Comment: Performed at [facility], [place]. [address], [place], [place] [address]  Expectorated Sputum Assessment w Gram Stain, Rflx to Resp Cult     Status: None  Collection Time: 01/02/22  2:00 PM   Specimen: Sputum  Result Value Ref Range Status   Specimen Description SPUTUM  Final   Special Requests EXPSU  Final   Sputum evaluation   Final    Sputum specimen not acceptable for testing.  Please recollect.   RESULT CALLED TO, READ BACK BY AND VERIFIED WITH: Westley Gambles 01/02/22 1453 MW Performed at [facility], [address], [place], [place] [address]    Report Status 01/02/2022 FINAL  Final   Time coordinating discharge: 25 minutes  SIGNED: Antonieta Pert, MD  [facility] 01/03/2022, 10:09 AM  If 7PM-7AM, please contact night-coverage [email or web address]

## 2022-01-03 NOTE — Plan of Care (Signed)

## 2022-01-04 DIAGNOSIS — Z85118 Personal history of other malignant neoplasm of bronchus and lung: Secondary | ICD-10-CM | POA: Diagnosis not present

## 2022-01-04 DIAGNOSIS — Z7952 Long term (current) use of systemic steroids: Secondary | ICD-10-CM | POA: Diagnosis not present

## 2022-01-04 DIAGNOSIS — I10 Essential (primary) hypertension: Secondary | ICD-10-CM | POA: Diagnosis not present

## 2022-01-04 DIAGNOSIS — Z7951 Long term (current) use of inhaled steroids: Secondary | ICD-10-CM | POA: Diagnosis not present

## 2022-01-04 DIAGNOSIS — K219 Gastro-esophageal reflux disease without esophagitis: Secondary | ICD-10-CM | POA: Diagnosis not present

## 2022-01-04 DIAGNOSIS — Z809 Family history of malignant neoplasm, unspecified: Secondary | ICD-10-CM | POA: Diagnosis not present

## 2022-01-04 DIAGNOSIS — J449 Chronic obstructive pulmonary disease, unspecified: Secondary | ICD-10-CM | POA: Diagnosis not present

## 2022-01-04 DIAGNOSIS — Z8249 Family history of ischemic heart disease and other diseases of the circulatory system: Secondary | ICD-10-CM | POA: Diagnosis not present

## 2022-01-04 DIAGNOSIS — R69 Illness, unspecified: Secondary | ICD-10-CM | POA: Diagnosis not present

## 2022-01-04 DIAGNOSIS — F3342 Major depressive disorder, recurrent, in full remission: Secondary | ICD-10-CM | POA: Diagnosis not present

## 2022-01-04 DIAGNOSIS — E785 Hyperlipidemia, unspecified: Secondary | ICD-10-CM | POA: Diagnosis not present

## 2022-01-09 ENCOUNTER — Encounter: Payer: Self-pay | Admitting: Obstetrics and Gynecology

## 2022-01-09 ENCOUNTER — Other Ambulatory Visit: Payer: Self-pay | Admitting: Nurse Practitioner

## 2022-01-09 DIAGNOSIS — Z1231 Encounter for screening mammogram for malignant neoplasm of breast: Secondary | ICD-10-CM

## 2022-01-16 ENCOUNTER — Encounter: Payer: Self-pay | Admitting: Surgery

## 2022-01-16 ENCOUNTER — Other Ambulatory Visit: Payer: Self-pay

## 2022-01-16 ENCOUNTER — Ambulatory Visit (INDEPENDENT_AMBULATORY_CARE_PROVIDER_SITE_OTHER): Payer: 59 | Admitting: Surgery

## 2022-01-16 VITALS — BP 125/80 | HR 86 | Temp 98.1°F | Ht 65.0 in | Wt 158.0 lb

## 2022-01-16 DIAGNOSIS — K436 Other and unspecified ventral hernia with obstruction, without gangrene: Secondary | ICD-10-CM

## 2022-01-16 DIAGNOSIS — E782 Mixed hyperlipidemia: Secondary | ICD-10-CM | POA: Diagnosis not present

## 2022-01-16 DIAGNOSIS — Z09 Encounter for follow-up examination after completed treatment for conditions other than malignant neoplasm: Secondary | ICD-10-CM

## 2022-01-16 DIAGNOSIS — I10 Essential (primary) hypertension: Secondary | ICD-10-CM | POA: Diagnosis not present

## 2022-01-16 DIAGNOSIS — R5383 Other fatigue: Secondary | ICD-10-CM | POA: Diagnosis not present

## 2022-01-16 NOTE — Progress Notes (Signed)
Outpatient Surgical Follow Up  01/16/2022  Debra Mccann is an 66 y.o. female.   Chief Complaint  Patient presents with   Routine Post Op    Hernia repair     HPI: Debra Mccann is 3 weeks out after ventral hernia repair open.  She does have severe COPD and had a recent hospitalization due to hypoxia related to COPD exacerbation.  She has been doing otherwise okay no fevers no chills she continues to smoke once again I reemphasized importance of avoiding smoking for improving outcomes and proving longevity of the repair as well as decreasing complications  Past Medical History:  Diagnosis Date   Asthma    Bronchitis    Cancer ([place])    lung cancer   Cervical high risk HPV (human papillomavirus) test positive 10/24/2016   COPD (chronic obstructive pulmonary disease) (HCC)    Depression    HPV (human papilloma virus) infection 10/22/2016   Evaluated by GYN [facility]   LGSIL on Pap smear of cervix 10/24/2016   With HPV(+) and mild dysplasia   Lung cancer ([place]) 2004   Lung cancer ([place])    OA (osteoarthritis) of hip    right   Osteopenia    Personal history of chemotherapy    Urticaria     Past Surgical History:  Procedure Laterality Date   ABDOMINAL HYSTERECTOMY  1997   partial, only ovaries remain; due to heavy bleeding   CHOLECYSTECTOMY  1999   COLONOSCOPY WITH PROPOFOL N/A 05/19/2017   Procedure: COLONOSCOPY WITH PROPOFOL;  Surgeon: Jonathon Bellows, MD;  Location[facility] ENDOSCOPY;  Service: Gastroenterology;  Laterality: N/A;   INSERTION OF MESH  12/25/2021   Procedure: INSERTION OF MESH;  Surgeon: Jules Husbands, MD;  Location: [facility] ORS;  Service: General;;   LUNG CANCER SURGERY Right 2004   removed "top portion of right lung"   [place] N/A 12/25/2021   Procedure: HERNIA REPAIR VENTRAL ADULT, open;  Surgeon: Jules Husbands, MD;  Location: [facility] ORS;  Service: General;  Laterality: N/A;    Family History  Problem Relation Age of Onset   CAD Mother        with  stents   Heart disease Mother    Hypertension Mother    Heart disease Father        3 vessel CABG   Cancer Father        melanoma   Hypertension Father    Gout Father    Depression Daughter    Alzheimer's disease Maternal Grandmother    Breast cancer Cousin    Diabetes Neg Hx    Stroke Neg Hx    COPD Neg Hx     Social History:  reports that she has been smoking cigarettes. She has a 11.50 pack-year smoking history. She has never used smokeless tobacco. She reports that she does not drink alcohol and does not use drugs.  Allergies:  Allergies  Allergen Reactions   Penicillins Hives    Medications reviewed.    ROS Full ROS performed and is otherwise negative other than what is stated in HPI   BP 125/80   Pulse 86   Temp 98.1 F (36.7 C) (Oral)   Ht 5\' 5"  (1.651 m)   Wt 158 lb (71.7 kg)   SpO2 96%   BMI 26.29 kg/m   Physical Exam Vitals and nursing note reviewed.  Constitutional:      General: She is not in acute distress.    Appearance: Normal appearance.  She is normal weight.  Pulmonary:     Effort: Pulmonary effort is normal. No respiratory distress.     Breath sounds: Normal breath sounds. No stridor.  Abdominal:     General: Abdomen is flat. There is no distension.     Palpations: Abdomen is soft. There is no mass.     Tenderness: There is no abdominal tenderness.     Hernia: No hernia is present.     Comments: Incision healing well without evidence of infection.  There is only small very superficial dehiscence in the lower portion of the wound measuring about 4 mm.  Skin:    General: Skin is warm and dry.     Capillary Refill: Capillary refill takes less than 2 seconds.  Neurological:     General: No focal deficit present.     Mental Status: She is alert and oriented to person, place, and time.  Psychiatric:        Mood and Affect: Mood normal.        Behavior: Behavior normal.        Thought Content: Thought content normal.        Judgment:  Judgment normal.       Assessment/Plan: 66 yo 3 weeks after ventral hernia repair.  No evidence of complications.  There is a very superficial skin dehiscence on the lower aspect.  At this point no need for any dressing other than daily Band-Aid.  It is not infected there is no exposure of mesh or anything like that.  I will see her back again in a month or so for another wound check Please note that I spent 20 minutes in this encounter including personally reviewing imaging studies, coordinating her care, placing orders, counseling the patient and family and performing appropriate documentation    Caroleen Hamman, MD [facility]

## 2022-01-16 NOTE — Patient Instructions (Signed)

## 2022-01-21 DIAGNOSIS — R5383 Other fatigue: Secondary | ICD-10-CM | POA: Diagnosis not present

## 2022-01-21 DIAGNOSIS — I10 Essential (primary) hypertension: Secondary | ICD-10-CM | POA: Diagnosis not present

## 2022-01-21 DIAGNOSIS — R0600 Dyspnea, unspecified: Secondary | ICD-10-CM | POA: Diagnosis not present

## 2022-01-21 DIAGNOSIS — R69 Illness, unspecified: Secondary | ICD-10-CM | POA: Diagnosis not present

## 2022-01-21 DIAGNOSIS — E782 Mixed hyperlipidemia: Secondary | ICD-10-CM | POA: Diagnosis not present

## 2022-02-11 ENCOUNTER — Encounter: Payer: Self-pay | Admitting: Surgery

## 2022-02-11 ENCOUNTER — Ambulatory Visit (INDEPENDENT_AMBULATORY_CARE_PROVIDER_SITE_OTHER): Payer: Medicare HMO | Admitting: Surgery

## 2022-02-11 ENCOUNTER — Other Ambulatory Visit: Payer: Self-pay

## 2022-02-11 VITALS — BP 137/78 | HR 71 | Temp 98.2°F | Ht 65.0 in | Wt 161.0 lb

## 2022-02-11 DIAGNOSIS — Z09 Encounter for follow-up examination after completed treatment for conditions other than malignant neoplasm: Secondary | ICD-10-CM | POA: Diagnosis not present

## 2022-02-11 DIAGNOSIS — K436 Other and unspecified ventral hernia with obstruction, without gangrene: Secondary | ICD-10-CM | POA: Diagnosis not present

## 2022-02-11 NOTE — Patient Instructions (Signed)

## 2022-02-12 NOTE — Progress Notes (Signed)
Outpatient Surgical Follow Up  02/12/2022  Debra Mccann is an 66 y.o. female.   Chief Complaint  Patient presents with   Routine Post Op    Ventarl hernia    HPI: Debra Mccann is 6 weeks out after ventral hernia repair open.  She does have severe COPD and had a recent hospitalization due to hypoxia related to COPD exacerbation . She just stopped smking a few weeks ago and shortly after her wound healed. She is doing better form overall health and pulm perspective  Past Medical History:  Diagnosis Date   Asthma    Bronchitis    Cancer ([place])    lung cancer   Cervical high risk HPV (human papillomavirus) test positive 10/24/2016   COPD (chronic obstructive pulmonary disease) (HCC)    Depression    HPV (human papilloma virus) infection 10/22/2016   Evaluated by GYN [facility]   LGSIL on Pap smear of cervix 10/24/2016   With HPV(+) and mild dysplasia   Lung cancer ([place]) 2004   Lung cancer ([place])    OA (osteoarthritis) of hip    right   Osteopenia    Personal history of chemotherapy    Urticaria     Past Surgical History:  Procedure Laterality Date   ABDOMINAL HYSTERECTOMY  1997   partial, only ovaries remain; due to heavy bleeding   CHOLECYSTECTOMY  1999   COLONOSCOPY WITH PROPOFOL N/A 05/19/2017   Procedure: COLONOSCOPY WITH PROPOFOL;  Surgeon: Jonathon Bellows, MD;  Location: [facility] ENDOSCOPY;  Service: Gastroenterology;  Laterality: N/A;   INSERTION OF MESH  12/25/2021   Procedure: INSERTION OF MESH;  Surgeon: Jules Husbands, MD;  Location: [facility] ORS;  Service: General;;   LUNG CANCER SURGERY Right 2004   removed "top portion of right lung"   [place] N/A 12/25/2021   Procedure: HERNIA REPAIR VENTRAL ADULT, open;  Surgeon: Jules Husbands, MD;  Location: [facility] ORS;  Service: General;  Laterality: N/A;    Family History  Problem Relation Age of Onset   CAD Mother        with stents   Heart disease Mother    Hypertension Mother    Heart disease Father        3  vessel CABG   Cancer Father        melanoma   Hypertension Father    Gout Father    Depression Daughter    Alzheimer's disease Maternal Grandmother    Breast cancer Cousin    Diabetes Neg Hx    Stroke Neg Hx    COPD Neg Hx     Social History:  reports that she has been smoking cigarettes. She has a 11.50 pack-year smoking history. She has never used smokeless tobacco. She reports that she does not drink alcohol and does not use drugs.  Allergies:  Allergies  Allergen Reactions   Penicillins Hives    Medications reviewed.    ROS Full ROS performed and is otherwise negative other than what is stated in HPI   BP 137/78   Pulse 71   Temp 98.2 F (36.8 C) (Oral)   Ht 5\' 5"  (1.651 m)   Wt 161 lb (73 kg)   SpO2 97%   BMI 26.79 kg/m   Physical Exam   Physical Exam Vitals and nursing note reviewed.  Constitutional:      General: She is not in acute distress.    Appearance: Normal appearance. She is normal weight.  Pulmonary:  Effort: Pulmonary effort is normal. No respiratory distress.     Breath sounds: Normal breath sounds. No stridor.  Abdominal:     General: Abdomen is flat. There is no distension.     Palpations: Abdomen is soft. There is no mass.     Tenderness: There is no abdominal tenderness.     Hernia: No hernia is present.     Comments: Incision healing well without evidence of infection.  no open wounds. Two small eschars Skin:    General: Skin is warm and dry.     Capillary Refill: Capillary refill takes less than 2 seconds.  Neurological:     General: No focal deficit present.     Mental Status: She is alert and oriented to person, place, and time.  Psychiatric:        Mood and Affect: Mood normal.        Behavior: Behavior normal.        Thought Content: Thought content normal.        Judgment: Judgment normal.          Assessment/Plan: 66 yo 6 weeks after ventral hernia repair.  No evidence of complications.  Please note that I spent  20 minutes in this encounter including personally reviewing imaging studies, coordinating her care, placing orders, counseling the patient and family and performing appropriate documentation  Caroleen Hamman, MD [place]

## 2022-04-01 ENCOUNTER — Other Ambulatory Visit: Payer: Self-pay | Admitting: Nurse Practitioner

## 2022-09-14 ENCOUNTER — Other Ambulatory Visit: Payer: Self-pay | Admitting: Nurse Practitioner

## 2022-09-16 ENCOUNTER — Other Ambulatory Visit: Payer: Self-pay | Admitting: Family

## 2022-09-17 ENCOUNTER — Ambulatory Visit: Payer: Medicare HMO | Admitting: Family

## 2022-09-17 VITALS — BP 150/84 | HR 80 | Ht 65.0 in | Wt 178.4 lb

## 2022-09-17 DIAGNOSIS — R7303 Prediabetes: Secondary | ICD-10-CM | POA: Diagnosis not present

## 2022-09-17 DIAGNOSIS — E785 Hyperlipidemia, unspecified: Secondary | ICD-10-CM | POA: Diagnosis not present

## 2022-09-17 DIAGNOSIS — E538 Deficiency of other specified B group vitamins: Secondary | ICD-10-CM

## 2022-09-17 DIAGNOSIS — R5383 Other fatigue: Secondary | ICD-10-CM | POA: Diagnosis not present

## 2022-09-17 DIAGNOSIS — J441 Chronic obstructive pulmonary disease with (acute) exacerbation: Secondary | ICD-10-CM | POA: Diagnosis not present

## 2022-09-17 DIAGNOSIS — E559 Vitamin D deficiency, unspecified: Secondary | ICD-10-CM | POA: Diagnosis not present

## 2022-09-17 DIAGNOSIS — E669 Obesity, unspecified: Secondary | ICD-10-CM | POA: Diagnosis not present

## 2022-09-17 DIAGNOSIS — F331 Major depressive disorder, recurrent, moderate: Secondary | ICD-10-CM | POA: Diagnosis not present

## 2022-09-17 NOTE — Progress Notes (Signed)
Established Patient Office Visit  Subjective:  Patient ID: Debra Mccann, female    DOB: 1955/12/05  Age: 67 y.o. MRN: [id number]  Chief Complaint  Patient presents with   Follow-up    Patient is here today for her 3 months follow up.  She has been feeling fairly well since last appointment.   She does have additional concerns to discuss today.   She has gained some weight due to stress eating. Husband has had multiple operations in the last few months.  Usually doesn't eat but one meal a day, but has been eating more. Does say that she is trying to watch what she is eating much more carefully.  Says she is sleeping too much, has no energy    Labs are due today. She needs refills.   I have reviewed her active problem list, medication list, allergies, notes from last encounter, lab results for her appointment today.    No other concerns at this time.   Past Medical History:  Diagnosis Date   Asthma    Bronchitis    Cancer (HCC)    lung cancer   Cervical high risk HPV (human papillomavirus) test positive 10/24/2016   COPD (chronic obstructive pulmonary disease) (HCC)    Depression    History of primary non-small cell carcinoma of right lung s/p RUL resection 2004    HPV (human papilloma virus) infection 10/22/2016   Evaluated by GYN [facility]   LGSIL on Pap smear of cervix 10/24/2016   With HPV(+) and mild dysplasia   Lung cancer (HCC) 2004   Lung cancer (HCC)    OA (osteoarthritis) of hip    right   Osteopenia    Personal history of chemotherapy    Urticaria     Past Surgical History:  Procedure Laterality Date   ABDOMINAL HYSTERECTOMY  1997   partial, only ovaries remain; due to heavy bleeding   CHOLECYSTECTOMY  1999   COLONOSCOPY WITH PROPOFOL N/A 05/19/2017   Procedure: COLONOSCOPY WITH PROPOFOL;  Surgeon: Wyline Mood, MD;  Location: [facility] ENDOSCOPY;  Service: Gastroenterology;  Laterality: N/A;   INSERTION OF MESH  12/25/2021   Procedure:  INSERTION OF MESH;  Surgeon: Leafy Ro, MD;  Location: [facility] ORS;  Service: General;;   LUNG CANCER SURGERY Right 2004   removed "top portion of right lung"   VENTRAL HERNIA REPAIR N/A 12/25/2021   Procedure: HERNIA REPAIR VENTRAL ADULT, open;  Surgeon: Leafy Ro, MD;  Location: [facility] ORS;  Service: General;  Laterality: N/A;    Social History   Socioeconomic History   Marital status: Married    Spouse name: Not on file   Number of children: Not on file   Years of education: Not on file   Highest education level: Not on file  Occupational History   Not on file  Tobacco Use   Smoking status: Every Day    Current packs/day: 0.50    Average packs/day: 0.5 packs/day for 23.0 years (11.5 ttl pk-yrs)    Types: Cigarettes   Smokeless tobacco: Never  Vaping Use   Vaping status: Never Used  Substance and Sexual Activity   Alcohol use: No    Alcohol/week: 0.0 standard drinks of alcohol   Drug use: No   Sexual activity: Yes  Other Topics Concern   Not on file  Social History Narrative   Not on file   Social Determinants of Health   Financial Resource Strain: Not on file  Food Insecurity: No  Food Insecurity (01/01/2022)   Hunger Vital Sign    Worried About Running Out of Food in the Last Year: Never true    Ran Out of Food in the Last Year: Never true  Transportation Needs: No Transportation Needs (01/01/2022)   PRAPARE - Administrator, Civil Service (Medical): No    Lack of Transportation (Non-Medical): No  Physical Activity: Not on file  Stress: Not on file  Social Connections: Not on file  Intimate Partner Violence: Not At Risk (01/01/2022)   Humiliation, Afraid, Rape, and Kick questionnaire    Fear of Current or Ex-Partner: No    Emotionally Abused: No    Physically Abused: No    Sexually Abused: No    Family History  Problem Relation Age of Onset   CAD Mother        with stents   Heart disease Mother    Hypertension Mother    Heart disease  Father        3 vessel CABG   Cancer Father        melanoma   Hypertension Father    Gout Father    Depression Daughter    Alzheimer's disease Maternal Grandmother    Breast cancer Cousin    Diabetes Neg Hx    Stroke Neg Hx    COPD Neg Hx     Allergies  Allergen Reactions   Penicillins Hives    Review of Systems  All other systems reviewed and are negative.      Objective:   BP (!) 150/84   Pulse 80   Ht 5\' 5"  (1.651 m)   Wt 178 lb 6.4 oz (80.9 kg)   SpO2 95%   BMI 29.69 kg/m   Vitals:   09/17/22 1341  BP: (!) 150/84  Pulse: 80  Height: 5\' 5"  (1.651 m)  Weight: 178 lb 6.4 oz (80.9 kg)  SpO2: 95%  BMI (Calculated): 29.69    Physical Exam Vitals and nursing note reviewed.  Constitutional:      Appearance: Normal appearance. She is normal weight.  HENT:     Head: Normocephalic.  Eyes:     Extraocular Movements: Extraocular movements intact.     Conjunctiva/sclera: Conjunctivae normal.     Pupils: Pupils are equal, round, and reactive to light.  Cardiovascular:     Rate and Rhythm: Normal rate.  Pulmonary:     Effort: Pulmonary effort is normal.  Neurological:     General: No focal deficit present.     Mental Status: She is alert and oriented to person, place, and time. Mental status is at baseline.  Psychiatric:        Mood and Affect: Mood normal.        Behavior: Behavior normal.        Thought Content: Thought content normal.        Judgment: Judgment normal.      Results for orders placed or performed in visit on 09/17/22  Lipid panel  Result Value Ref Range   Cholesterol, Total 154 100 - 199 mg/dL   Triglycerides 161 0 - 149 mg/dL   HDL 77 >09 mg/dL   VLDL Cholesterol Cal 20 5 - 40 mg/dL   LDL Chol Calc (NIH) 57 0 - 99 mg/dL   Chol/HDL Ratio 2.0 0.0 - 4.4 ratio  VITAMIN D 25 Hydroxy (Vit-D Deficiency, Fractures)  Result Value Ref Range   Vit D, 25-Hydroxy 58.3 30.0 - 100.0 ng/mL  CBC With Differential  Result Value Ref Range   WBC  6.8 3.4 - 10.8 x10E3/uL   RBC 5.49 (H) 3.77 - 5.28 x10E6/uL   Hemoglobin 15.6 11.1 - 15.9 g/dL   Hematocrit 56.3 (H) 87.5 - 46.6 %   MCV 85 79 - 97 fL   MCH 28.4 26.6 - 33.0 pg   MCHC 33.4 31.5 - 35.7 g/dL   RDW 64.3 32.9 - 51.8 %   Neutrophils 58 Not Estab. %   Lymphs 29 Not Estab. %   Monocytes 8 Not Estab. %   Eos 4 Not Estab. %   Basos 1 Not Estab. %   Neutrophils Absolute 3.9 1.4 - 7.0 x10E3/uL   Lymphocytes Absolute 2.0 0.7 - 3.1 x10E3/uL   Monocytes Absolute 0.5 0.1 - 0.9 x10E3/uL   EOS (ABSOLUTE) 0.3 0.0 - 0.4 x10E3/uL   Basophils Absolute 0.1 0.0 - 0.2 x10E3/uL   Immature Granulocytes 0 Not Estab. %   Immature Grans (Abs) 0.0 0.0 - 0.1 x10E3/uL  CMP14+EGFR  Result Value Ref Range   Glucose 89 70 - 99 mg/dL   BUN 7 (L) 8 - 27 mg/dL   Creatinine, Ser 8.41 0.57 - 1.00 mg/dL   eGFR 74 >66 AY/TKZ/6.01   BUN/Creatinine Ratio 8 (L) 12 - 28   Sodium 140 134 - 144 mmol/L   Potassium 4.4 3.5 - 5.2 mmol/L   Chloride 102 96 - 106 mmol/L   CO2 23 20 - 29 mmol/L   Calcium 9.7 8.7 - 10.3 mg/dL   Total Protein 6.3 6.0 - 8.5 g/dL   Albumin 4.4 3.9 - 4.9 g/dL   Globulin, Total 1.9 1.5 - 4.5 g/dL   Bilirubin Total 0.7 0.0 - 1.2 mg/dL   Alkaline Phosphatase 54 44 - 121 IU/L   AST 11 0 - 40 IU/L   ALT 10 0 - 32 IU/L  TSH  Result Value Ref Range   TSH 1.730 0.450 - 4.500 uIU/mL  Hemoglobin A1c  Result Value Ref Range   Hgb A1c MFr Bld 5.6 4.8 - 5.6 %   Est. average glucose Bld gHb Est-mCnc 114 mg/dL  Vitamin U93  Result Value Ref Range   Vitamin B-12 1,041 232 - 1,245 pg/mL  Iron, TIBC and Ferritin Panel  Result Value Ref Range   Total Iron Binding Capacity 397 250 - 450 ug/dL   UIBC 235 573 - 220 ug/dL   Iron 97 27 - 254 ug/dL   Iron Saturation 24 15 - 55 %   Ferritin 35 15 - 150 ng/mL    Recent Results (from the past 2160 hour(s))  Lipid panel     Status: None   Collection Time: 09/19/22 11:33 AM  Result Value Ref Range   Cholesterol, Total 154 100 - 199 mg/dL    Triglycerides 270 0 - 149 mg/dL   HDL 77 >62 mg/dL   VLDL Cholesterol Cal 20 5 - 40 mg/dL   LDL Chol Calc (NIH) 57 0 - 99 mg/dL   Chol/HDL Ratio 2.0 0.0 - 4.4 ratio    Comment:                                   T. Chol/HDL Ratio  Men  Women                               1/2 Avg.Risk  3.4    3.3                                   Avg.Risk  5.0    4.4                                2X Avg.Risk  9.6    7.1                                3X Avg.Risk 23.4   11.0   VITAMIN D 25 Hydroxy (Vit-D Deficiency, Fractures)     Status: None   Collection Time: 09/19/22 11:33 AM  Result Value Ref Range   Vit D, 25-Hydroxy 58.3 30.0 - 100.0 ng/mL    Comment: Vitamin D deficiency has been defined by the Institute of Medicine and an Endocrine Society practice guideline as a level of serum 25-OH vitamin D less than 20 ng/mL (1,2). The Endocrine Society went on to further define vitamin D insufficiency as a level between 21 and 29 ng/mL (2). 1. IOM (Institute of Medicine). 2010. Dietary reference    intakes for calcium and D. [place]: The    [employer]. 2. Holick MF, Binkley Peavine, Bischoff-Ferrari HA, et al.    Evaluation, treatment, and prevention of vitamin D    deficiency: an Endocrine Society clinical practice    guideline. JCEM. 2011 Jul; 96(7):1911-30.   CBC With Differential     Status: Abnormal   Collection Time: 09/19/22 11:33 AM  Result Value Ref Range   WBC 6.8 3.4 - 10.8 x10E3/uL   RBC 5.49 (H) 3.77 - 5.28 x10E6/uL   Hemoglobin 15.6 11.1 - 15.9 g/dL   Hematocrit 95.6 (H) 38.7 - 46.6 %   MCV 85 79 - 97 fL   MCH 28.4 26.6 - 33.0 pg   MCHC 33.4 31.5 - 35.7 g/dL   RDW 56.4 33.2 - 95.1 %   Neutrophils 58 Not Estab. %   Lymphs 29 Not Estab. %   Monocytes 8 Not Estab. %   Eos 4 Not Estab. %   Basos 1 Not Estab. %   Neutrophils Absolute 3.9 1.4 - 7.0 x10E3/uL   Lymphocytes Absolute 2.0 0.7 - 3.1 x10E3/uL   Monocytes Absolute 0.5  0.1 - 0.9 x10E3/uL   EOS (ABSOLUTE) 0.3 0.0 - 0.4 x10E3/uL   Basophils Absolute 0.1 0.0 - 0.2 x10E3/uL   Immature Granulocytes 0 Not Estab. %   Immature Grans (Abs) 0.0 0.0 - 0.1 x10E3/uL    Comment: **Effective September 23, 2022, profile [id number] CBC/Differential**   (No Platelet) will be made non-orderable. [facility] Offers:   [id number] CBC With Differential/Platelet   CMP14+EGFR     Status: Abnormal   Collection Time: 09/19/22 11:33 AM  Result Value Ref Range   Glucose 89 70 - 99 mg/dL   BUN 7 (L) 8 - 27 mg/dL   Creatinine, Ser 0.63 0.57 - 1.00 mg/dL   eGFR 74 >01 SW/FUX/3.23   BUN/Creatinine Ratio 8 (L) 12 - 28   Sodium 140 134 - 144 mmol/L   Potassium 4.4 3.5 -  5.2 mmol/L   Chloride 102 96 - 106 mmol/L   CO2 23 20 - 29 mmol/L   Calcium 9.7 8.7 - 10.3 mg/dL   Total Protein 6.3 6.0 - 8.5 g/dL   Albumin 4.4 3.9 - 4.9 g/dL   Globulin, Total 1.9 1.5 - 4.5 g/dL   Bilirubin Total 0.7 0.0 - 1.2 mg/dL   Alkaline Phosphatase 54 44 - 121 IU/L   AST 11 0 - 40 IU/L   ALT 10 0 - 32 IU/L  TSH     Status: None   Collection Time: 09/19/22 11:33 AM  Result Value Ref Range   TSH 1.730 0.450 - 4.500 uIU/mL  Hemoglobin A1c     Status: None   Collection Time: 09/19/22 11:33 AM  Result Value Ref Range   Hgb A1c MFr Bld 5.6 4.8 - 5.6 %    Comment:          Prediabetes: 5.7 - 6.4          Diabetes: >6.4          Glycemic control for adults with diabetes: <7.0    Est. average glucose Bld gHb Est-mCnc 114 mg/dL  Vitamin Z61     Status: None   Collection Time: 09/19/22 11:33 AM  Result Value Ref Range   Vitamin B-12 1,041 232 - 1,245 pg/mL  Iron, TIBC and Ferritin Panel     Status: None   Collection Time: 09/19/22 11:33 AM  Result Value Ref Range   Total Iron Binding Capacity 397 250 - 450 ug/dL   UIBC 096 045 - 409 ug/dL   Iron 97 27 - 811 ug/dL   Iron Saturation 24 15 - 55 %   Ferritin 35 15 - 150 ng/mL       Assessment & Plan:   Problem List Items Addressed This Visit       Active  Problems   Major depressive disorder, recurrent episode, moderate with anxious distress (HCC)   Relevant Medications   citalopram (CELEXA) 20 MG tablet   hydrOXYzine (VISTARIL) 25 MG capsule   Other Relevant Orders   CBC With Differential (Completed)   CMP14+EGFR (Completed)   Vitamin D deficiency - Primary    Checking labs today.  Will continue supplements as needed.       Relevant Orders   VITAMIN D 25 Hydroxy (Vit-D Deficiency, Fractures) (Completed)   CBC With Differential (Completed)   CMP14+EGFR (Completed)   Fatigue   Relevant Orders   CBC With Differential (Completed)   CMP14+EGFR (Completed)   TSH (Completed)   Iron, TIBC and Ferritin Panel (Completed)   Obesity, Class II, BMI 35-39.9, no comorbidity    Continue current meds.  Will adjust as needed based on results.  The patient is asked to make an attempt to improve diet and exercise patterns to aid in medical management of this problem. Addressed importance of increasing and maintaining water intake.      Dyslipidemia    Checking labs today.  Continue current therapy for lipid control. Will modify as needed based on labwork results.       Relevant Orders   Lipid panel (Completed)   CBC With Differential (Completed)   CMP14+EGFR (Completed)   Other Visit Diagnoses     Chronic obstructive pulmonary disease with (acute) exacerbation (HCC)   (Chronic)     Relevant Medications   fluticasone furoate-vilanterol (BREO ELLIPTA) 200-25 MCG/ACT AEPB   Other Relevant Orders   CBC With Differential (Completed)   CMP14+EGFR (Completed)  Prediabetes       Checking labs today Patient counseled on dietary choices and verbalized understanding.   Relevant Orders   CBC With Differential (Completed)   CMP14+EGFR (Completed)   Hemoglobin A1c (Completed)   B12 deficiency due to diet       Relevant Orders   Vitamin B12 (Completed)       Return in about 4 months (around 01/18/2023).   Total time spent: 20  minutes  Miki Kins, FNP  09/17/2022   This document may have been prepared by [facility] Voice Recognition software and as such may include unintentional dictation errors.

## 2022-09-19 ENCOUNTER — Other Ambulatory Visit: Payer: Medicare HMO

## 2022-09-19 DIAGNOSIS — R7303 Prediabetes: Secondary | ICD-10-CM | POA: Diagnosis not present

## 2022-09-19 DIAGNOSIS — E785 Hyperlipidemia, unspecified: Secondary | ICD-10-CM | POA: Diagnosis not present

## 2022-09-19 DIAGNOSIS — J441 Chronic obstructive pulmonary disease with (acute) exacerbation: Secondary | ICD-10-CM | POA: Diagnosis not present

## 2022-09-19 DIAGNOSIS — R5383 Other fatigue: Secondary | ICD-10-CM | POA: Diagnosis not present

## 2022-09-19 DIAGNOSIS — E538 Deficiency of other specified B group vitamins: Secondary | ICD-10-CM | POA: Diagnosis not present

## 2022-09-19 DIAGNOSIS — F331 Major depressive disorder, recurrent, moderate: Secondary | ICD-10-CM | POA: Diagnosis not present

## 2022-09-19 DIAGNOSIS — E559 Vitamin D deficiency, unspecified: Secondary | ICD-10-CM | POA: Diagnosis not present

## 2022-09-20 LAB — CBC WITH DIFFERENTIAL: Neutrophils Absolute: 3.9 10*3/uL (ref 1.4–7.0)

## 2022-09-23 ENCOUNTER — Telehealth: Payer: Self-pay | Admitting: Nurse Practitioner

## 2022-09-23 NOTE — Telephone Encounter (Signed)
Patient wants you to review her labs please.

## 2022-09-28 ENCOUNTER — Encounter: Payer: Self-pay | Admitting: Family

## 2022-09-28 NOTE — Assessment & Plan Note (Signed)
Checking labs today.  Continue current therapy for lipid control. Will modify as needed based on labwork results.  

## 2022-09-28 NOTE — Assessment & Plan Note (Signed)
Checking labs today.  Will continue supplements as needed.  

## 2022-09-28 NOTE — Assessment & Plan Note (Signed)
Continue current meds.  Will adjust as needed based on results.  The patient is asked to make an attempt to improve diet and exercise patterns to aid in medical management of this problem. Addressed importance of increasing and maintaining water intake.   

## 2022-10-04 ENCOUNTER — Other Ambulatory Visit: Payer: Self-pay | Admitting: Nurse Practitioner

## 2022-10-05 ENCOUNTER — Other Ambulatory Visit: Payer: Self-pay | Admitting: Nurse Practitioner

## 2022-10-07 ENCOUNTER — Other Ambulatory Visit: Payer: Self-pay

## 2022-10-08 MED ORDER — CITALOPRAM HYDROBROMIDE 20 MG PO TABS: 20.0000 mg | ORAL_TABLET | Freq: Every day | ORAL | 1 refills | Status: DC

## 2022-10-09 ENCOUNTER — Other Ambulatory Visit: Payer: Self-pay | Admitting: Family

## 2022-10-14 ENCOUNTER — Other Ambulatory Visit: Payer: Self-pay

## 2022-10-17 ENCOUNTER — Other Ambulatory Visit: Payer: Self-pay

## 2022-10-23 ENCOUNTER — Other Ambulatory Visit: Payer: Self-pay

## 2022-11-18 DIAGNOSIS — Z20822 Contact with and (suspected) exposure to covid-19: Secondary | ICD-10-CM | POA: Diagnosis not present

## 2022-11-18 DIAGNOSIS — H6591 Unspecified nonsuppurative otitis media, right ear: Secondary | ICD-10-CM | POA: Diagnosis not present

## 2022-11-18 DIAGNOSIS — J069 Acute upper respiratory infection, unspecified: Secondary | ICD-10-CM | POA: Diagnosis not present

## 2022-11-25 ENCOUNTER — Other Ambulatory Visit: Payer: Self-pay | Admitting: Cardiology

## 2022-11-25 MED ORDER — ROSUVASTATIN CALCIUM 10 MG PO TABS: 10.0000 mg | ORAL_TABLET | Freq: Every evening | ORAL | 1 refills | Status: DC

## 2022-12-02 DIAGNOSIS — R490 Dysphonia: Secondary | ICD-10-CM | POA: Diagnosis not present

## 2022-12-02 DIAGNOSIS — F17200 Nicotine dependence, unspecified, uncomplicated: Secondary | ICD-10-CM | POA: Diagnosis not present

## 2022-12-09 ENCOUNTER — Ambulatory Visit: Payer: Medicare HMO | Attending: Unknown Physician Specialty

## 2022-12-09 DIAGNOSIS — R490 Dysphonia: Secondary | ICD-10-CM | POA: Diagnosis not present

## 2022-12-09 NOTE — Therapy (Signed)
OUTPATIENT SPEECH LANGUAGE PATHOLOGY  VOICE EVALUATION   Patient Name: Debra Mccann MRN: [id number] DOB:11-01-1955, 67 y.o., female Today's Date: 12/09/2022  PCP: Leata Mouse, NP REFERRING PROVIDER: Linus Salmons, MD   End of Session - 12/09/22 1443     Visit Number 1    Number of Visits 25    Date for SLP Re-Evaluation 03/03/23    Authorization Type [employer]    SLP Start Time 1358    SLP Stop Time  1455    SLP Time Calculation (min) 57 min    Activity Tolerance Patient tolerated treatment well             Past Medical History:  Diagnosis Date   Asthma    Bronchitis    Cancer (HCC)    lung cancer   Cervical high risk HPV (human papillomavirus) test positive 10/24/2016   COPD (chronic obstructive pulmonary disease) (HCC)    Depression    History of primary non-small cell carcinoma of right lung s/p RUL resection 2004    HPV (human papilloma virus) infection 10/22/2016   Evaluated by GYN [facility]   LGSIL on Pap smear of cervix 10/24/2016   With HPV(+) and mild dysplasia   Lung cancer (HCC) 2004   Lung cancer (HCC)    OA (osteoarthritis) of hip    right   Osteopenia    Personal history of chemotherapy    Urticaria    Past Surgical History:  Procedure Laterality Date   ABDOMINAL HYSTERECTOMY  1997   partial, only ovaries remain; due to heavy bleeding   CHOLECYSTECTOMY  1999   COLONOSCOPY WITH PROPOFOL N/A 05/19/2017   Procedure: COLONOSCOPY WITH PROPOFOL;  Surgeon: Wyline Mood, MD;  Location: [facility] ENDOSCOPY;  Service: Gastroenterology;  Laterality: N/A;   INSERTION OF MESH  12/25/2021   Procedure: INSERTION OF MESH;  Surgeon: Leafy Ro, MD;  Location: [facility] ORS;  Service: General;;   LUNG CANCER SURGERY Right 2004   removed "top portion of right lung"   VENTRAL HERNIA REPAIR N/A 12/25/2021   Procedure: HERNIA REPAIR VENTRAL ADULT, open;  Surgeon: Leafy Ro, MD;  Location: [facility] ORS;  Service: General;  Laterality: N/A;    Patient Active Problem List   Diagnosis Date Noted   Migraine headache 12/31/2021   Incarcerated ventral hernia    SOB (shortness of breath)    Anxiety and depression 05/10/2021   Ganglion cyst of volar aspect of left wrist 08/23/2020   Dyslipidemia 10/24/2016   LGSIL on Pap smear of cervix 10/24/2016   HPV (human papilloma virus) infection 10/22/2016   Preventative health care 05/23/2016   Obesity, Class II, BMI 35-39.9, no comorbidity 05/23/2016   Odynophagia 02/22/2016   Food sticks on swallowing 02/22/2016   Weight gain 01/30/2016   Fatigue 01/30/2016   Snoring 06/15/2015   Calcium oxalate crystals in urine 06/14/2015   Erythrocytosis 06/14/2015   COPD (chronic obstructive pulmonary disease) (HCC) 01/30/2015   Night sweats 01/30/2015   Persistent cough 01/30/2015   Tobacco use disorder 12/09/2014   Vitamin D deficiency 11/18/2014   Major depressive disorder, recurrent episode, moderate with anxious distress (HCC)    Urticaria    OA (osteoarthritis) of hip    Osteopenia 08/08/2014    ONSET DATE: ~4 weeks ago; referral date 12/02/22  REFERRING DIAG: hoarseness  THERAPY DIAG:  Hoarseness  Dysphonia  Dysphonia plicae ventricularis  Rationale for Evaluation and Treatment Rehabilitation  SUBJECTIVE:   SUBJECTIVE STATEMENT: "I have no voice." Pt accompanied  by: self  PERTINENT HISTORY: hx of non-small cell carcinoma of R lung s/p RUL resection in 2004, GERD, COPD, tobacco use disoder, anxiety and depression  DIAGNOSTIC FINDINGS: Laryngoscopy 10/7 "normal mobility, polypoid changes to both cords, using false cords to speak, and severe erythema particularly of the L false cord"  PAIN:  Are you having pain? No; some soreness in throat   FALLS: Has patient fallen in last 6 months? 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 Occupational risks: Other: as above Misuse: Hyperfunction, Strain, Tension, Speaks without adequate breath support, and Speaks on residual capacity Phonotraumatic behaviors: Speaks in noise, Performs or speaks to large groups without amplification, Excessive voice use, Screams or shouts frequently, and Excessive and/or habitual throat clearing  PERCEPTUAL VOICE ASSESSMENT: Voice quality: hoarse, strained, low vocal intensity, vocal fatigue, and aphonic Vocal abuse: habitual throat clearing, abnormal breathing pattern, and excessive voice use Resonance: normal Respiratory function: clavicular breathing and speaking on residual capacity  OBJECTIVE VOICE ASSESSMENT: Objective Voice Measurements Initial Evaluation     Maximum phonation time for sustained "ah" 7 sec    Average fundamental frequency during sustained "ah" 218 Hz  (average of 244 Hz +/- 27 for gender)     Habitual pitch 222 Hz    Highest dynamic pitch in conversational speech 324 Hz    Lowest dynamic pitch in conversational speech 147 Hz    Average dB in conversation 79 dB    /s/ :/z/ ratio(suggestive of dysfunction > 1.0) .92     ORAL MOTOR EXAMINATION Cough: WFL Voice: Hoarse, Strained   PATIENT REPORTED OUTCOME MEASURES (PROM):  VOICE HANDICAP INDEX (VHI)  The Voice Handicap Index is comprised of a series of questions to assess the patient's perception of their voice. It is designed to evaluate the emotional, physical and functional components of the voice problem.  Functional: 13/40 Physical: 22/40 Emotional: 15/40 Total: 50/120 moderate   TODAY'S TREATMENT:  Voice treatment initiated including  stimulability assessment. Pt stimulable to use of clear speech and resonant voice therapy techniques.  Pt stimulable for all voice tx techniques with marked improvement in vocal quality. Pt with fair-good awareness of how speech sounds and feels when strategies are utilized. Pt educated re: role of SLP, rationale for voice therapy, results of assessment, and SLP POC. Introduced Building control surveyor and concept of vocal naps. Pt verbalized understanding/agreement. Reinforcement of content needed.   PATIENT EDUCATION: Education details: as above Person educated: Patient Education method: Explanation Education comprehension: verbalized understanding; needs further education   HOME EXERCISE PROGRAM: 1) modifications to caffeine intake 2) use of humidifier in sunroom (where she spends most of the day) and bedroom 3) use of clear speech in conversations     GOALS: Goals reviewed with patient? Yes  SHORT TERM GOALS: Target date: 10 sessions     The patient will demonstrate abdominal breathing patterns and steady release of breath on exhalation to optimize efficiency of voicing and decrease laryngeal hyperfunction.   Baseline: Goal status: INITIAL   2.  Patient will ID x3 strategies to improve vocal quality, vocal hygiene, voice projection, and prevent vocal fatigue.  Baseline:  Goal status: INITIAL   3.  The patient will utilize a forward tone focused/resonant voice to decrease vocal hyperfunction and improve voice quality and vocal projection.   Baseline:  Goal status: INITIAL   4.  The patient will participate in 5-8 minutes conversation, maintaining average loudness of 75 dB and loud, good quality voice with min cues.       Baseline:  Goal status: INITIAL   5.  The patient will decrease laryngeal and articulatory muscle tension by independently completing relaxation/stretching exercises with min cueing.    Baseline:  Goal status: INITIAL  6.  The patient will reduce  phonotraumatic behaviors such as chronic throat clearing, by substituting non-traumatic methods to clear mucus.   Baseline:  Goal status: INITIAL  LONG TERM GOALS: Target date: 12 weeks   Patient will report improved communication effectiveness as measured by PROM  Baseline: VHI 50/120 Goal status: INITIAL   2.  The patient will participate in 15-20 minutes conversation, maintaining average loudness of 75 dB and loud, good quality voice.   Baseline:  Goal status: INITIAL   3.  The patient will decrease laryngeal and articulatory muscle tension by independently completing relaxation/stretching exercises independently.  Baseline:  Goal status: INITIAL      ASSESSMENT:  CLINICAL IMPRESSION: Patient is a 67 y.o. female who was seen today for voice evaluation given dysphonia and hoarseness in setting of "plica ventricularis" (per ENT).  Pt with reports of sudden and persistent hoarseness for about a month. Laryngoscopy 12/02/22, "normal mobility, polypoid changes to both cords, using false cords to speak, and severe erythema particularly of the L false cord." Pt's voice today is c/b strained/hoarse vocal quality with pitch breaks and periods of aphonia.  Pt with tendency to utilize clavicular breathing with phonation/effort. Pt with some phonotraumatic behaviors (e.g. throat clearing, coughing, yelling) as well as tobacco use and moderate caffeine intake. 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 Pt stimulable to both clear speech and resonant voice therapy. Pt is motivated to improve vocal quality. I recommend skilled ST to improve vocal quality, endurance, and vocal hygiene to meet other vocal demands of work and home.   OBJECTIVE IMPAIRMENTS include voice disorder. These impairments are limiting patient from return to work, ADLs/IADLs, and effectively communicating at home and in community. Factors  affecting potential to achieve goals and functional outcome are severity of impairments. Patient will benefit from skilled SLP services to address above impairments and improve overall function.  REHAB POTENTIAL: Good  PLAN: SLP FREQUENCY: 1-2x/week  SLP DURATION: 12 weeks  PLANNED INTERVENTIONS: Functional tasks, SLP instruction and feedback, Compensatory strategies, and Patient/family education    Clyde Canterbury, M.S., CCC-SLP Speech-Language Pathologist [place] [facility] [phone] Arnette Felts)  [facility] Out[facility] at [facility] [address] [place], [place], [address] Phone: [phone]   Fax:  [phone]

## 2022-12-12 ENCOUNTER — Ambulatory Visit: Payer: Medicare HMO

## 2022-12-12 DIAGNOSIS — R490 Dysphonia: Secondary | ICD-10-CM | POA: Diagnosis not present

## 2022-12-12 NOTE — Therapy (Signed)
OUTPATIENT SPEECH LANGUAGE PATHOLOGY  VOICE TREATMENT   Patient Name: Debra Mccann MRN: [id number] DOB:November 01, 1955, 67 y.o., female Today's Date: 12/12/2022  PCP: Leata Mouse, NP REFERRING PROVIDER: Linus Salmons, MD   End of Session - 12/12/22 1625     Visit Number 2    Number of Visits 25    Date for SLP Re-Evaluation 03/03/23             Past Medical History:  Diagnosis Date   Asthma    Bronchitis    Cancer (HCC)    lung cancer   Cervical high risk HPV (human papillomavirus) test positive 10/24/2016   COPD (chronic obstructive pulmonary disease) (HCC)    Depression    History of primary non-small cell carcinoma of right lung s/p RUL resection 2004    HPV (human papilloma virus) infection 10/22/2016   Evaluated by GYN [facility]   LGSIL on Pap smear of cervix 10/24/2016   With HPV(+) and mild dysplasia   Lung cancer (HCC) 2004   Lung cancer (HCC)    OA (osteoarthritis) of hip    right   Osteopenia    Personal history of chemotherapy    Urticaria    Past Surgical History:  Procedure Laterality Date   ABDOMINAL HYSTERECTOMY  1997   partial, only ovaries remain; due to heavy bleeding   CHOLECYSTECTOMY  1999   COLONOSCOPY WITH PROPOFOL N/A 05/19/2017   Procedure: COLONOSCOPY WITH PROPOFOL;  Surgeon: Wyline Mood, MD;  Location: [facility] ENDOSCOPY;  Service: Gastroenterology;  Laterality: N/A;   INSERTION OF MESH  12/25/2021   Procedure: INSERTION OF MESH;  Surgeon: Leafy Ro, MD;  Location: [facility] ORS;  Service: General;;   LUNG CANCER SURGERY Right 2004   removed "top portion of right lung"   VENTRAL HERNIA REPAIR N/A 12/25/2021   Procedure: HERNIA REPAIR VENTRAL ADULT, open;  Surgeon: Leafy Ro, MD;  Location: [facility] ORS;  Service: General;  Laterality: N/A;   Patient Active Problem List   Diagnosis Date Noted   Migraine headache 12/31/2021   Incarcerated ventral hernia    SOB (shortness of breath)    Anxiety and depression 05/10/2021    Ganglion cyst of volar aspect of left wrist 08/23/2020   Dyslipidemia 10/24/2016   LGSIL on Pap smear of cervix 10/24/2016   HPV (human papilloma virus) infection 10/22/2016   Preventative health care 05/23/2016   Obesity, Class II, BMI 35-39.9, no comorbidity 05/23/2016   Odynophagia 02/22/2016   Food sticks on swallowing 02/22/2016   Weight gain 01/30/2016   Fatigue 01/30/2016   Snoring 06/15/2015   Calcium oxalate crystals in urine 06/14/2015   Erythrocytosis 06/14/2015   COPD (chronic obstructive pulmonary disease) (HCC) 01/30/2015   Night sweats 01/30/2015   Persistent cough 01/30/2015   Tobacco use disorder 12/09/2014   Vitamin D deficiency 11/18/2014   Major depressive disorder, recurrent episode, moderate with anxious distress (HCC)    Urticaria    OA (osteoarthritis) of hip    Osteopenia 08/08/2014    ONSET DATE: ~4 weeks ago; referral date 12/02/22  REFERRING DIAG: hoarseness  THERAPY DIAG:  Hoarseness  Dysphonia  Dysphonia plicae ventricularis  Rationale for Evaluation and Treatment Rehabilitation  SUBJECTIVE:   SUBJECTIVE STATEMENT: "My Mom said my voice sounds better." Pt accompanied by: self  PERTINENT HISTORY: hx of non-small cell carcinoma of R lung s/p RUL resection in 2004, GERD, COPD, tobacco use disoder, anxiety and depression  DIAGNOSTIC FINDINGS: Laryngoscopy 10/7 "normal mobility, polypoid changes to both  cords, using false cords to speak, and severe erythema particularly of the L false cord"  PAIN:  Are you having pain? No; some soreness in throat   FALLS: Has patient fallen in last 6 months? No  LIVING ENVIRONMENT: Lives with: lives with their spouse Lives in: House/apartment  PLOF: Independent  PATIENT GOALS    for voice to improve  OBJECTIVE:   TODAY'S TREATMENT:  Pt completed resonant voice therapy at the word, phrase, level. Min/mod verbal cues needed. Marked difficulty with voice onset after initial phonation as well as  increased vocal hoarseness with lower intensity. Strained vocal quality noted. Improvement noted with brief circumlaryngeal massage and neck ROM/stretching. ?component of muscle tension dysphonia. During structured and unstructured conversation, moderate hoarseness appreciated. Pt benefited from cues to utilize clear speech (e.g. slow, loud, overarticulate)    Noted pt with occasional throat clearing and   cough. Educated provided re: phonotraumatic     nature of throat clearing, coughing, and   yelling. Offered replacement behavior of    swallow and exhalation for throat clearing.      Reviewed vocal hygiene including   adequate hydration, diet modifications,   environmental modifications, and vocal naps. Will   continue to address in upcoming sessions.       PATIENT EDUCATION: Education details: as above Person educated: Patient Education method: Designer, jewellery Education comprehension: verbalized understanding; needs further education   HOME EXERCISE PROGRAM: 1) modifications to caffeine intake 2) use of clear speech in conversations 3) resonant voice HEP     GOALS: Goals reviewed with patient? Yes  SHORT TERM GOALS: Target date: 10 sessions     The patient will demonstrate abdominal breathing patterns and steady release of breath on exhalation to optimize efficiency of voicing and decrease laryngeal hyperfunction.   Baseline: Goal status: INITIAL   2.  Patient will ID x3 strategies to improve vocal quality, vocal hygiene, voice projection, and prevent vocal fatigue.    Baseline:  Goal status: INITIAL   3.  The patient will utilize a forward tone focused/resonant voice to decrease vocal hyperfunction and improve voice quality and vocal projection.   Baseline:  Goal status: INITIAL   4.  The patient will participate in 5-8 minutes conversation, maintaining average loudness of 75 dB and loud, good quality voice with min cues.       Baseline:  Goal status:  INITIAL   5.  The patient will decrease laryngeal and articulatory muscle tension by independently completing relaxation/stretching exercises with min cueing.    Baseline:  Goal status: INITIAL  6.  The patient will reduce phonotraumatic behaviors such as chronic throat clearing, by substituting non-traumatic methods to clear mucus.   Baseline:  Goal status: INITIAL  LONG TERM GOALS: Target date: 12 weeks  Patient will report improved communication effectiveness as measured by PROM  Baseline: VHI 50/120 Goal status: INITIAL   2.  The patient will participate in 15-20 minutes conversation, maintaining average loudness of 75 dB and loud, good quality voice.   Baseline:  Goal status: INITIAL   3.  The patient will decrease laryngeal and articulatory muscle tension by independently completing relaxation/stretching exercises independently.  Baseline:  Goal status: INITIAL      ASSESSMENT:  CLINICAL IMPRESSION: Patient is a 67 y.o. female who was seen today for voice evaluation given dysphonia and hoarseness in setting of "plica ventricularis" (per ENT).  Pt with reports of sudden and persistent hoarseness for about a month. Laryngoscopy 12/02/22, "normal mobility, polypoid changes  to both cords, using false cords to speak, and severe erythema particularly of the L false cord." Pt's voice today is c/b strained/hoarse vocal quality with pitch breaks and periods of aphonia.  Pt with tendency to utilize clavicular breathing with phonation/effort. Pt with some phonotraumatic behaviors (e.g. throat clearing, coughing, yelling) as well as tobacco use and moderate caffeine intake. 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 Pt stimulable to both clear speech and resonant voice therapy. Pt is motivated to improve vocal quality. See details of tx session as above. I recommend skilled ST to improve vocal quality,  endurance, and vocal hygiene to meet other vocal demands of work and home.   OBJECTIVE IMPAIRMENTS include voice disorder. These impairments are limiting patient from return to work, ADLs/IADLs, and effectively communicating at home and in community. Factors affecting potential to achieve goals and functional outcome are severity of impairments. Patient will benefit from skilled SLP services to address above impairments and improve overall function.  REHAB POTENTIAL: Good  PLAN: SLP FREQUENCY: 1-2x/week  SLP DURATION: 12 weeks  PLANNED INTERVENTIONS: Functional tasks, SLP instruction and feedback, Compensatory strategies, and Patient/family education    Clyde Canterbury, M.S., CCC-SLP Speech-Language Pathologist [place] [facility] [phone] Arnette Felts)  [facility] Outpatient Rehabilitation at [facility] [address] [place], [place], [address] Phone: [phone]   Fax:  [phone]

## 2022-12-16 ENCOUNTER — Ambulatory Visit: Payer: Medicare HMO

## 2022-12-17 ENCOUNTER — Ambulatory Visit: Payer: Medicare HMO

## 2022-12-17 ENCOUNTER — Other Ambulatory Visit: Payer: Self-pay

## 2022-12-17 DIAGNOSIS — R490 Dysphonia: Secondary | ICD-10-CM

## 2022-12-17 MED ORDER — CITALOPRAM HYDROBROMIDE 20 MG PO TABS: 20.0000 mg | ORAL_TABLET | Freq: Every day | ORAL | 1 refills | Status: DC

## 2022-12-17 MED ORDER — ROSUVASTATIN CALCIUM 10 MG PO TABS: 10.0000 mg | ORAL_TABLET | Freq: Every evening | ORAL | 1 refills | Status: DC

## 2022-12-17 NOTE — Therapy (Signed)
OUTPATIENT SPEECH LANGUAGE PATHOLOGY  VOICE TREATMENT   Patient Name: Debra Mccann MRN: [id number] DOB:1955-03-07, 67 y.o., female Today's Date: 12/17/2022  PCP: Leata Mouse, NP REFERRING PROVIDER: Linus Salmons, MD   End of Session - 12/17/22 1607     Visit Number 3    Number of Visits 25    Date for SLP Re-Evaluation 03/03/23    Authorization Type [employer]    Progress Note Due on Visit 10    SLP Start Time 1400    SLP Stop Time  1445    SLP Time Calculation (min) 45 min    Activity Tolerance Patient tolerated treatment well             Past Medical History:  Diagnosis Date   Asthma    Bronchitis    Cancer (HCC)    lung cancer   Cervical high risk HPV (human papillomavirus) test positive 10/24/2016   COPD (chronic obstructive pulmonary disease) (HCC)    Depression    History of primary non-small cell carcinoma of right lung s/p RUL resection 2004    HPV (human papilloma virus) infection 10/22/2016   Evaluated by GYN [facility]   LGSIL on Pap smear of cervix 10/24/2016   With HPV(+) and mild dysplasia   Lung cancer (HCC) 2004   Lung cancer (HCC)    OA (osteoarthritis) of hip    right   Osteopenia    Personal history of chemotherapy    Urticaria    Past Surgical History:  Procedure Laterality Date   ABDOMINAL HYSTERECTOMY  1997   partial, only ovaries remain; due to heavy bleeding   CHOLECYSTECTOMY  1999   COLONOSCOPY WITH PROPOFOL N/A 05/19/2017   Procedure: COLONOSCOPY WITH PROPOFOL;  Surgeon: Wyline Mood, MD;  Location: [facility] ENDOSCOPY;  Service: Gastroenterology;  Laterality: N/A;   INSERTION OF MESH  12/25/2021   Procedure: INSERTION OF MESH;  Surgeon: Leafy Ro, MD;  Location: [facility] ORS;  Service: General;;   LUNG CANCER SURGERY Right 2004   removed "top portion of right lung"   VENTRAL HERNIA REPAIR N/A 12/25/2021   Procedure: HERNIA REPAIR VENTRAL ADULT, open;  Surgeon: Leafy Ro, MD;  Location: [facility] ORS;  Service:  General;  Laterality: N/A;   Patient Active Problem List   Diagnosis Date Noted   Migraine headache 12/31/2021   Incarcerated ventral hernia    SOB (shortness of breath)    Anxiety and depression 05/10/2021   Ganglion cyst of volar aspect of left wrist 08/23/2020   Dyslipidemia 10/24/2016   LGSIL on Pap smear of cervix 10/24/2016   HPV (human papilloma virus) infection 10/22/2016   Preventative health care 05/23/2016   Obesity, Class II, BMI 35-39.9, no comorbidity 05/23/2016   Odynophagia 02/22/2016   Food sticks on swallowing 02/22/2016   Weight gain 01/30/2016   Fatigue 01/30/2016   Snoring 06/15/2015   Calcium oxalate crystals in urine 06/14/2015   Erythrocytosis 06/14/2015   COPD (chronic obstructive pulmonary disease) (HCC) 01/30/2015   Night sweats 01/30/2015   Persistent cough 01/30/2015   Tobacco use disorder 12/09/2014   Vitamin D deficiency 11/18/2014   Major depressive disorder, recurrent episode, moderate with anxious distress (HCC)    Urticaria    OA (osteoarthritis) of hip    Osteopenia 08/08/2014    ONSET DATE: ~4 weeks ago; referral date 12/02/22  REFERRING DIAG: hoarseness  THERAPY DIAG:  Hoarseness  Dysphonia  Dysphonia plicae ventricularis  Rationale for Evaluation and Treatment Rehabilitation  SUBJECTIVE:  SUBJECTIVE STATEMENT: "I'm tired today." Pt endorse feeling "blah." Pt questions if she is getting sick.   Pt accompanied by: self  PERTINENT HISTORY: hx of non-small cell carcinoma of R lung s/p RUL resection in 2004, GERD, COPD, tobacco use disoder, anxiety and depression  DIAGNOSTIC FINDINGS: Laryngoscopy 10/7 "normal mobility, polypoid changes to both cords, using false cords to speak, and severe erythema particularly of the L false cord"  PAIN:  Are you having pain? No; some soreness in throat   FALLS: Has patient fallen in last 6 months? No  LIVING ENVIRONMENT: Lives with: lives with their spouse Lives in:  House/apartment  PLOF: Independent  PATIENT GOALS    for voice to improve  OBJECTIVE:   TODAY'S TREATMENT:   Pt with notably hoarse, hypophonic and occasionally strained vocal quality. Marked iprovement noted with brief circumlaryngeal massage and neck ROM/stretching. ?component of muscle tension dysphonia. Pt taught simple circumlaryngeal massage to be completed via HEP. Pt completed head and neck stretches with initial SLP modeling (neck flexion, extension, and rotation). Pt held each stretch for ~5s and completed x5 reps of each.    Pt completed resonant voice therapy at the word and phrase level. Min/mod verbal cues needed. Marked difficulty with voice onset after initial phonation as well as increased vocal hoarseness with lower intensity. During structured and unstructured conversation, moderate hoarseness appreciated. Pt benefited from cues to utilize clear speech (e.g. slow, loud, overarticulate)    Noted pt with occasional throat clearing and cough. Educated provided re: phonotraumatic nature of throat clearing, coughing, and yelling. Pt independently utilizing pursed lip breathing to suppress cough/throat clear on x4 occasions during session.     Reviewed vocal hygiene including adequate hydration, diet modifications, environmental modifications, and vocal naps. Will continue to address in upcoming sessions.       PATIENT EDUCATION: Education details: as above Person educated: Patient Education method: Designer, jewellery Education comprehension: verbalized understanding; needs further education   HOME EXERCISE PROGRAM: 1) modifications to caffeine intake 2) use of clear speech in conversations 3) resonant voice HEP 4) self circumlaryngeal massage     GOALS: Goals reviewed with patient? Yes  SHORT TERM GOALS: Target date: 10 sessions     The patient will demonstrate abdominal breathing patterns and steady release of breath on exhalation to optimize efficiency  of voicing and decrease laryngeal hyperfunction.   Baseline: Goal status: INITIAL   2.  Patient will ID x3 strategies to improve vocal quality, vocal hygiene, voice projection, and prevent vocal fatigue.    Baseline:  Goal status: INITIAL   3.  The patient will utilize a forward tone focused/resonant voice to decrease vocal hyperfunction and improve voice quality and vocal projection.   Baseline:  Goal status: INITIAL   4.  The patient will participate in 5-8 minutes conversation, maintaining average loudness of 75 dB and loud, good quality voice with min cues.       Baseline:  Goal status: INITIAL   5.  The patient will decrease laryngeal and articulatory muscle tension by independently completing relaxation/stretching exercises with min cueing.    Baseline:  Goal status: INITIAL  6.  The patient will reduce phonotraumatic behaviors such as chronic throat clearing, by substituting non-traumatic methods to clear mucus.   Baseline:  Goal status: INITIAL  LONG TERM GOALS: Target date: 12 weeks  Patient will report improved communication effectiveness as measured by PROM  Baseline: VHI 50/120 Goal status: INITIAL   2.  The patient will participate in 15-20  minutes conversation, maintaining average loudness of 75 dB and loud, good quality voice.   Baseline:  Goal status: INITIAL   3.  The patient will decrease laryngeal and articulatory muscle tension by independently completing relaxation/stretching exercises independently.  Baseline:  Goal status: INITIAL      ASSESSMENT:  CLINICAL IMPRESSION: Patient is a 67 y.o. female who was seen today for voice evaluation given dysphonia and hoarseness in setting of "plica ventricularis" (per ENT).  Pt with reports of sudden and persistent hoarseness for about a month. Laryngoscopy 12/02/22, "normal mobility, polypoid changes to both cords, using false cords to speak, and severe erythema particularly of the L false cord." Pt's voice  today is c/b strained/hoarse vocal quality with pitch breaks and periods of aphonia.  Pt with tendency to utilize clavicular breathing with phonation/effort. Pt with some phonotraumatic behaviors (e.g. throat clearing, coughing, yelling) as well as tobacco use and moderate caffeine intake. 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 Pt stimulable to both clear speech and resonant voice therapy. Pt is motivated to improve vocal quality. See details of tx session as above. I recommend skilled ST to improve vocal quality, endurance, and vocal hygiene to meet other vocal demands of work and home.   OBJECTIVE IMPAIRMENTS include voice disorder. These impairments are limiting patient from return to work, ADLs/IADLs, and effectively communicating at home and in community. Factors affecting potential to achieve goals and functional outcome are severity of impairments. Patient will benefit from skilled SLP services to address above impairments and improve overall function.  REHAB POTENTIAL: Good  PLAN: SLP FREQUENCY: 1-2x/week  SLP DURATION: 12 weeks  PLANNED INTERVENTIONS: Functional tasks, SLP instruction and feedback, Compensatory strategies, and Patient/family education    Clyde Canterbury, M.S., CCC-SLP Speech-Language Pathologist [place] [facility] [phone] Arnette Felts)  [facility] at [facility] [address] [place], [place], [address] Phone: [phone]   Fax:  [phone]

## 2022-12-19 ENCOUNTER — Ambulatory Visit: Payer: Medicare HMO

## 2022-12-19 DIAGNOSIS — R490 Dysphonia: Secondary | ICD-10-CM

## 2022-12-19 NOTE — Therapy (Signed)
OUTPATIENT SPEECH LANGUAGE PATHOLOGY  VOICE TREATMENT   Patient Name: Debra Mccann MRN: [id number] DOB:[birth year], 67 y.o., female Today's Date: 12/19/2022  PCP: Leata Mouse, NP REFERRING PROVIDER: Linus Salmons, MD   End of Session - 12/19/22 1251     Visit Number 4    Number of Visits 25    Date for SLP Re-Evaluation 03/03/23    Authorization Type [employer]    Progress Note Due on Visit 10    SLP Start Time 1150    SLP Stop Time  1235    SLP Time Calculation (min) 45 min             Past Medical History:  Diagnosis Date   Asthma    Bronchitis    Cancer (HCC)    lung cancer   Cervical high risk HPV (human papillomavirus) test positive 10/24/2016   COPD (chronic obstructive pulmonary disease) (HCC)    Depression    History of primary non-small cell carcinoma of right lung s/p RUL resection 2004    HPV (human papilloma virus) infection 10/22/2016   Evaluated by GYN [facility]   LGSIL on Pap smear of cervix 10/24/2016   With HPV(+) and mild dysplasia   Lung cancer (HCC) 2004   Lung cancer (HCC)    OA (osteoarthritis) of hip    right   Osteopenia    Personal history of chemotherapy    Urticaria    Past Surgical History:  Procedure Laterality Date   ABDOMINAL HYSTERECTOMY  1997   partial, only ovaries remain; due to heavy bleeding   CHOLECYSTECTOMY  1999   COLONOSCOPY WITH PROPOFOL N/A 05/19/2017   Procedure: COLONOSCOPY WITH PROPOFOL;  Surgeon: Wyline Mood, MD;  Location: [facility] ENDOSCOPY;  Service: Gastroenterology;  Laterality: N/A;   INSERTION OF MESH  12/25/2021   Procedure: INSERTION OF MESH;  Surgeon: Leafy Ro, MD;  Location: [facility] ORS;  Service: General;;   LUNG CANCER SURGERY Right 2004   removed "top portion of right lung"   VENTRAL HERNIA REPAIR N/A 12/25/2021   Procedure: HERNIA REPAIR VENTRAL ADULT, open;  Surgeon: Leafy Ro, MD;  Location: [facility] ORS;  Service: General;  Laterality: N/A;   Patient Active Problem List    Diagnosis Date Noted   Migraine headache 12/31/2021   Incarcerated ventral hernia    SOB (shortness of breath)    Anxiety and depression 05/10/2021   Ganglion cyst of volar aspect of left wrist 08/23/2020   Dyslipidemia 10/24/2016   LGSIL on Pap smear of cervix 10/24/2016   HPV (human papilloma virus) infection 10/22/2016   Preventative health care 05/23/2016   Obesity, Class II, BMI 35-39.9, no comorbidity 05/23/2016   Odynophagia 02/22/2016   Food sticks on swallowing 02/22/2016   Weight gain 01/30/2016   Fatigue 01/30/2016   Snoring 06/15/2015   Calcium oxalate crystals in urine 06/14/2015   Erythrocytosis 06/14/2015   COPD (chronic obstructive pulmonary disease) (HCC) 01/30/2015   Night sweats 01/30/2015   Persistent cough 01/30/2015   Tobacco use disorder 12/09/2014   Vitamin D deficiency 11/18/2014   Major depressive disorder, recurrent episode, moderate with anxious distress (HCC)    Urticaria    OA (osteoarthritis) of hip    Osteopenia 08/08/2014    ONSET DATE: ~4 weeks ago; referral date 12/02/22  REFERRING DIAG: hoarseness  THERAPY DIAG:  Hoarseness  Dysphonia  Dysphonia plicae ventricularis  Rationale for Evaluation and Treatment Rehabilitation  SUBJECTIVE:   SUBJECTIVE STATEMENT: Pt alert, pleasant, and cooperative.  Pt accompanied by: self  PERTINENT HISTORY: hx of non-small cell carcinoma of R lung s/p RUL resection in 2004, GERD, COPD, tobacco use disoder, anxiety and depression  DIAGNOSTIC FINDINGS: Laryngoscopy 10/7 "normal mobility, polypoid changes to both cords, using false cords to speak, and severe erythema particularly of the L false cord"  PAIN:  Are you having pain? No; some soreness in throat   FALLS: Has patient fallen in last 6 months? No  LIVING ENVIRONMENT: Lives with: lives with their spouse Lives in: House/apartment  PLOF: Independent  PATIENT GOALS    for voice to improve  OBJECTIVE:   TODAY'S TREATMENT:   Pt  with notably hoarse, hypophonic and occasionally strained vocal quality. Marked improvement noted with brief circumlaryngeal massage. Pt completed circumlaryngeal massage for ~5 minutes. Endorsed completing 3x yesterday for around 5 minutes each. ?component of muscle tension dysphonia.    Pt completed resonant voice therapy at the word and phrase level. Min verbal cues needed and intermittent hoarseness appreciated. Pt noted a subjective improvement from previous sessions. During structured and unstructured conversation, moderate hoarseness appreciated. Pt benefited from cues to utilize clear speech (e.g. slow, loud, overarticulate)    Noted pt with occasional throat clearing and cough. Pt aware of phonatraumatic nature of coughing/throat clearing. Pt independently utilizing pursed lip breathing to suppress cough/throat clear on x2 occasions during session.     Reviewed vocal hygiene including adequate hydration, environmental modifications, and vocal naps. Pt stated upcoming weekend will be busy. Discussed ways to implement vocal hygiene in a variety of settings. Will continue to address in upcoming sessions.       PATIENT EDUCATION: Education details: as above Person educated: Patient Education method: Designer, jewellery Education comprehension: verbalized understanding; needs further education   HOME EXERCISE PROGRAM: 1) modifications to caffeine intake 2) use of clear speech in conversations 3) resonant voice HEP 4) self circumlaryngeal massage     GOALS: Goals reviewed with patient? Yes  SHORT TERM GOALS: Target date: 10 sessions     The patient will demonstrate abdominal breathing patterns and steady release of breath on exhalation to optimize efficiency of voicing and decrease laryngeal hyperfunction.   Baseline: Goal status: INITIAL   2.  Patient will ID x3 strategies to improve vocal quality, vocal hygiene, voice projection, and prevent vocal fatigue.    Baseline:   Goal status: INITIAL   3.  The patient will utilize a forward tone focused/resonant voice to decrease vocal hyperfunction and improve voice quality and vocal projection.   Baseline:  Goal status: INITIAL   4.  The patient will participate in 5-8 minutes conversation, maintaining average loudness of 75 dB and loud, good quality voice with min cues.       Baseline:  Goal status: INITIAL   5.  The patient will decrease laryngeal and articulatory muscle tension by independently completing relaxation/stretching exercises with min cueing.    Baseline:  Goal status: INITIAL  6.  The patient will reduce phonotraumatic behaviors such as chronic throat clearing, by substituting non-traumatic methods to clear mucus.   Baseline:  Goal status: INITIAL  LONG TERM GOALS: Target date: 12 weeks  Patient will report improved communication effectiveness as measured by PROM  Baseline: VHI 50/120 Goal status: INITIAL   2.  The patient will participate in 15-20 minutes conversation, maintaining average loudness of 75 dB and loud, good quality voice.   Baseline:  Goal status: INITIAL   3.  The patient will decrease laryngeal and articulatory muscle tension by  independently completing relaxation/stretching exercises independently.  Baseline:  Goal status: INITIAL      ASSESSMENT:  CLINICAL IMPRESSION: Patient is a 67 y.o. female who was seen today for voice evaluation given dysphonia and hoarseness in setting of "plica ventricularis" (per ENT).  Pt with reports of sudden and persistent hoarseness for about a month. Laryngoscopy 12/02/22, "normal mobility, polypoid changes to both cords, using false cords to speak, and severe erythema particularly of the L false cord." Pt's voice today is c/b strained/hoarse vocal quality with pitch breaks and periods of aphonia.  Pt with tendency to utilize clavicular breathing with phonation/effort. Pt with some phonotraumatic behaviors (e.g. throat clearing,  coughing, yelling) as well as tobacco use and moderate caffeine intake. 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 Pt stimulable to both clear speech and resonant voice therapy. Pt is motivated to improve vocal quality. See details of tx session as above. I recommend skilled ST to improve vocal quality, endurance, and vocal hygiene to meet other vocal demands of work and home.   OBJECTIVE IMPAIRMENTS include voice disorder. These impairments are limiting patient from return to work, ADLs/IADLs, and effectively communicating at home and in community. Factors affecting potential to achieve goals and functional outcome are severity of impairments. Patient will benefit from skilled SLP services to address above impairments and improve overall function.  REHAB POTENTIAL: Good  PLAN: SLP FREQUENCY: 1-2x/week  SLP DURATION: 12 weeks  PLANNED INTERVENTIONS: Functional tasks, SLP instruction and feedback, Compensatory strategies, and Patient/family education    Clyde Canterbury, M.S., CCC-SLP Speech-Language Pathologist [place] [facility] [phone] Arnette Felts)  [place] Outpatient Rehabilitation at [facility] [address] [place], [place], [address] Phone: [phone]   Fax:  [phone]

## 2022-12-23 ENCOUNTER — Ambulatory Visit: Payer: Medicare HMO

## 2022-12-24 ENCOUNTER — Ambulatory Visit: Payer: Medicare HMO

## 2022-12-24 ENCOUNTER — Other Ambulatory Visit: Payer: Self-pay | Admitting: Cardiology

## 2022-12-24 DIAGNOSIS — R490 Dysphonia: Secondary | ICD-10-CM | POA: Diagnosis not present

## 2022-12-24 MED ORDER — OMEPRAZOLE 40 MG PO CPDR: 40.0000 mg | DELAYED_RELEASE_CAPSULE | Freq: Every day | ORAL | 1 refills | Status: DC

## 2022-12-24 NOTE — Therapy (Signed)
OUTPATIENT SPEECH LANGUAGE PATHOLOGY  VOICE TREATMENT   Patient Name: Debra Mccann MRN: [id number] DOB:October 27, 1955, 67 y.o., female Today's Date: 12/24/2022  PCP: Leata Mouse, NP REFERRING PROVIDER: Linus Salmons, MD   End of Session - 12/24/22 1546     Visit Number 5    Number of Visits 25    Date for SLP Re-Evaluation 03/03/23    Authorization Type [employer]    Progress Note Due on Visit 10    SLP Start Time 1400    SLP Stop Time  1445    SLP Time Calculation (min) 45 min    Activity Tolerance Patient tolerated treatment well             Past Medical History:  Diagnosis Date   Asthma    Bronchitis    Cancer (HCC)    lung cancer   Cervical high risk HPV (human papillomavirus) test positive 10/24/2016   COPD (chronic obstructive pulmonary disease) (HCC)    Depression    History of primary non-small cell carcinoma of right lung s/p RUL resection 2004    HPV (human papilloma virus) infection 10/22/2016   Evaluated by GYN [facility]   LGSIL on Pap smear of cervix 10/24/2016   With HPV(+) and mild dysplasia   Lung cancer (HCC) 2004   Lung cancer (HCC)    OA (osteoarthritis) of hip    right   Osteopenia    Personal history of chemotherapy    Urticaria    Past Surgical History:  Procedure Laterality Date   ABDOMINAL HYSTERECTOMY  1997   partial, only ovaries remain; due to heavy bleeding   CHOLECYSTECTOMY  1999   COLONOSCOPY WITH PROPOFOL N/A 05/19/2017   Procedure: COLONOSCOPY WITH PROPOFOL;  Surgeon: Wyline Mood, MD;  Location: [facility] ENDOSCOPY;  Service: Gastroenterology;  Laterality: N/A;   INSERTION OF MESH  12/25/2021   Procedure: INSERTION OF MESH;  Surgeon: Leafy Ro, MD;  Location: [facility] ORS;  Service: General;;   LUNG CANCER SURGERY Right 2004   removed "top portion of right lung"   VENTRAL HERNIA REPAIR N/A 12/25/2021   Procedure: HERNIA REPAIR VENTRAL ADULT, open;  Surgeon: Leafy Ro, MD;  Location: [facility] ORS;  Service:  General;  Laterality: N/A;   Patient Active Problem List   Diagnosis Date Noted   Migraine headache 12/31/2021   Incarcerated ventral hernia    SOB (shortness of breath)    Anxiety and depression 05/10/2021   Ganglion cyst of volar aspect of left wrist 08/23/2020   Dyslipidemia 10/24/2016   LGSIL on Pap smear of cervix 10/24/2016   HPV (human papilloma virus) infection 10/22/2016   Preventative health care 05/23/2016   Obesity, Class II, BMI 35-39.9, no comorbidity 05/23/2016   Odynophagia 02/22/2016   Food sticks on swallowing 02/22/2016   Weight gain 01/30/2016   Fatigue 01/30/2016   Snoring 06/15/2015   Calcium oxalate crystals in urine 06/14/2015   Erythrocytosis 06/14/2015   COPD (chronic obstructive pulmonary disease) (HCC) 01/30/2015   Night sweats 01/30/2015   Persistent cough 01/30/2015   Tobacco use disorder 12/09/2014   Vitamin D deficiency 11/18/2014   Major depressive disorder, recurrent episode, moderate with anxious distress (HCC)    Urticaria    OA (osteoarthritis) of hip    Osteopenia 08/08/2014    ONSET DATE: ~4 weeks ago; referral date 12/02/22  REFERRING DIAG: hoarseness  THERAPY DIAG:  Hoarseness  Dysphonia  Dysphonia plicae ventricularis  Rationale for Evaluation and Treatment Rehabilitation  SUBJECTIVE:  SUBJECTIVE STATEMENT: Pt alert, pleasant, and cooperative. Endorsed "yesterday my voice was awful." ?overuse from the day prior. Pt concerned and encouraged to contact ENT.  Pt accompanied by: self  PERTINENT HISTORY: hx of non-small cell carcinoma of R lung s/p RUL resection in 2004, GERD, COPD, tobacco use disoder, anxiety and depression  DIAGNOSTIC FINDINGS: Laryngoscopy 10/7 "normal mobility, polypoid changes to both cords, using false cords to speak, and severe erythema particularly of the L false cord"  PAIN:  Are you having pain? No; some soreness in throat   FALLS: Has patient fallen in last 6 months? No  LIVING  ENVIRONMENT: Lives with: lives with their spouse Lives in: House/apartment  PLOF: Independent  PATIENT GOALS    for voice to improve  OBJECTIVE:   TODAY'S TREATMENT:   Pt with notably hoarse, hypophonic and occasionally strained vocal quality. Marked improvement noted with brief circumlaryngeal massage. Pt completed circumlaryngeal massage for ~5 minutes. Endorsed completing 3x yesterday for around 5 minutes each. ?component of muscle tension dysphonia.    Pt completed resonant voice therapy at the word and phrase level. Min verbal cues needed and intermittent hoarseness appreciated. During structured and unstructured conversation, moderate hoarseness appreciated. Pt benefited from cues to utilize clear speech (e.g. slow, loud, overarticulate, "speak like you are talking over the fence")    Noted pt with occasional throat clearing and cough. Pt aware of phonatraumatic nature of coughing/throat clearing. Pt independently utilizing pursed lip breathing to suppress cough/throat clear on x2 occasions during session.     Reviewed vocal hygiene including adequate hydration, environmental modifications, and vocal naps. Will continue to address in upcoming sessions.   Pt shown images of polypoid changes to vocal cords and educated re: changes to voice in smokers as well as hyperfunctional voicing - both of which were noted in ENT report.        PATIENT EDUCATION: Education details: as above Person educated: Patient Education method: Designer, jewellery Education comprehension: verbalized understanding; needs further education   HOME EXERCISE PROGRAM: 1) modifications to caffeine intake 2) use of clear speech in conversations 3) resonant voice HEP 4) self circumlaryngeal massage     GOALS: Goals reviewed with patient? Yes  SHORT TERM GOALS: Target date: 10 sessions     The patient will demonstrate abdominal breathing patterns and steady release of breath on exhalation to  optimize efficiency of voicing and decrease laryngeal hyperfunction.   Baseline: Goal status: INITIAL   2.  Patient will ID x3 strategies to improve vocal quality, vocal hygiene, voice projection, and prevent vocal fatigue.    Baseline:  Goal status: INITIAL   3.  The patient will utilize a forward tone focused/resonant voice to decrease vocal hyperfunction and improve voice quality and vocal projection.   Baseline:  Goal status: INITIAL   4.  The patient will participate in 5-8 minutes conversation, maintaining average loudness of 75 dB and loud, good quality voice with min cues.       Baseline:  Goal status: INITIAL   5.  The patient will decrease laryngeal and articulatory muscle tension by independently completing relaxation/stretching exercises with min cueing.    Baseline:  Goal status: INITIAL  6.  The patient will reduce phonotraumatic behaviors such as chronic throat clearing, by substituting non-traumatic methods to clear mucus.   Baseline:  Goal status: INITIAL  LONG TERM GOALS: Target date: 12 weeks  Patient will report improved communication effectiveness as measured by PROM  Baseline: VHI 50/120 Goal status: INITIAL  2.  The patient will participate in 15-20 minutes conversation, maintaining average loudness of 75 dB and loud, good quality voice.   Baseline:  Goal status: INITIAL   3.  The patient will decrease laryngeal and articulatory muscle tension by independently completing relaxation/stretching exercises independently.  Baseline:  Goal status: INITIAL      ASSESSMENT:  CLINICAL IMPRESSION: Patient is a 67 y.o. female who was seen today for voice evaluation given dysphonia and hoarseness in setting of "plica ventricularis" (per ENT).  Pt with reports of sudden and persistent hoarseness for about a month. Laryngoscopy 12/02/22, "normal mobility, polypoid changes to both cords, using false cords to speak, and severe erythema particularly of the L false  cord." Pt's voice today is c/b strained/hoarse vocal quality with pitch breaks and periods of aphonia.  Pt with tendency to utilize clavicular breathing with phonation/effort. Pt with some phonotraumatic behaviors (e.g. throat clearing, coughing, yelling) as well as tobacco use and moderate caffeine intake. 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 Pt stimulable to both clear speech and resonant voice therapy. Pt is motivated to improve vocal quality. See details of tx session as above. I recommend skilled ST to improve vocal quality, endurance, and vocal hygiene to meet other vocal demands of work and home.   OBJECTIVE IMPAIRMENTS include voice disorder. These impairments are limiting patient from return to work, ADLs/IADLs, and effectively communicating at home and in community. Factors affecting potential to achieve goals and functional outcome are severity of impairments. Patient will benefit from skilled SLP services to address above impairments and improve overall function.  REHAB POTENTIAL: Good  PLAN: SLP FREQUENCY: 1-2x/week  SLP DURATION: 12 weeks  PLANNED INTERVENTIONS: Functional tasks, SLP instruction and feedback, Compensatory strategies, and Patient/family education    Clyde Canterbury, M.S., CCC-SLP Speech-Language Pathologist [place] [facility] [id number] Arnette Felts)  [facility] Out[facility] at [facility] [address] [place], [place], [address] Phone: [phone]   Fax:  [phone]

## 2022-12-26 ENCOUNTER — Ambulatory Visit: Payer: Medicare HMO

## 2022-12-26 DIAGNOSIS — R490 Dysphonia: Secondary | ICD-10-CM | POA: Diagnosis not present

## 2022-12-26 DIAGNOSIS — B379 Candidiasis, unspecified: Secondary | ICD-10-CM | POA: Diagnosis not present

## 2022-12-26 NOTE — Therapy (Signed)
OUTPATIENT SPEECH LANGUAGE PATHOLOGY  VOICE TREATMENT   Patient Name: Debra Mccann MRN: [id number] DOB:04/11/1955, 67 y.o., female Today's Date: 12/26/2022  PCP: Leata Mouse, NP REFERRING PROVIDER: Linus Salmons, MD   End of Session - 12/26/22 1145     Visit Number 6    Number of Visits 25    Date for SLP Re-Evaluation 03/03/23    Authorization Type [employer]    Progress Note Due on Visit 10    SLP Start Time 0845    SLP Stop Time  0930    SLP Time Calculation (min) 45 min    Activity Tolerance Patient tolerated treatment well             Past Medical History:  Diagnosis Date   Asthma    Bronchitis    Cancer (HCC)    lung cancer   Cervical high risk HPV (human papillomavirus) test positive 10/24/2016   COPD (chronic obstructive pulmonary disease) (HCC)    Depression    History of primary non-small cell carcinoma of right lung s/p RUL resection 2004    HPV (human papilloma virus) infection 10/22/2016   Evaluated by GYN [facility]   LGSIL on Pap smear of cervix 10/24/2016   With HPV(+) and mild dysplasia   Lung cancer (HCC) 2004   Lung cancer (HCC)    OA (osteoarthritis) of hip    right   Osteopenia    Personal history of chemotherapy    Urticaria    Past Surgical History:  Procedure Laterality Date   ABDOMINAL HYSTERECTOMY  1997   partial, only ovaries remain; due to heavy bleeding   CHOLECYSTECTOMY  1999   COLONOSCOPY WITH PROPOFOL N/A 05/19/2017   Procedure: COLONOSCOPY WITH PROPOFOL;  Surgeon: Wyline Mood, MD;  Location: [facility] ENDOSCOPY;  Service: Gastroenterology;  Laterality: N/A;   INSERTION OF MESH  12/25/2021   Procedure: INSERTION OF MESH;  Surgeon: Leafy Ro, MD;  Location: [facility] ORS;  Service: General;;   LUNG CANCER SURGERY Right 2004   removed "top portion of right lung"   VENTRAL HERNIA REPAIR N/A 12/25/2021   Procedure: HERNIA REPAIR VENTRAL ADULT, open;  Surgeon: Leafy Ro, MD;  Location: [facility] ORS;  Service:  General;  Laterality: N/A;   Patient Active Problem List   Diagnosis Date Noted   Migraine headache 12/31/2021   Incarcerated ventral hernia    SOB (shortness of breath)    Anxiety and depression 05/10/2021   Ganglion cyst of volar aspect of left wrist 08/23/2020   Dyslipidemia 10/24/2016   LGSIL on Pap smear of cervix 10/24/2016   HPV (human papilloma virus) infection 10/22/2016   Preventative health care 05/23/2016   Obesity, Class II, BMI 35-39.9, no comorbidity 05/23/2016   Odynophagia 02/22/2016   Food sticks on swallowing 02/22/2016   Weight gain 01/30/2016   Fatigue 01/30/2016   Snoring 06/15/2015   Calcium oxalate crystals in urine 06/14/2015   Erythrocytosis 06/14/2015   COPD (chronic obstructive pulmonary disease) (HCC) 01/30/2015   Night sweats 01/30/2015   Persistent cough 01/30/2015   Tobacco use disorder 12/09/2014   Vitamin D deficiency 11/18/2014   Major depressive disorder, recurrent episode, moderate with anxious distress (HCC)    Urticaria    OA (osteoarthritis) of hip    Osteopenia 08/08/2014    ONSET DATE: ~4 weeks ago; referral date 12/02/22  REFERRING DIAG: hoarseness  THERAPY DIAG:  Hoarseness  Dysphonia  Dysphonia plicae ventricularis  Rationale for Evaluation and Treatment Rehabilitation  SUBJECTIVE:  SUBJECTIVE STATEMENT: Pt alert, pleasant, and cooperative. Pt has ENT appointment today. Endorsed feeling "tired," and her voice "sounds pretty good."  Pt accompanied by: self  PERTINENT HISTORY: hx of non-small cell carcinoma of R lung s/p RUL resection in 2004, GERD, COPD, tobacco use disoder, anxiety and depression  DIAGNOSTIC FINDINGS: Laryngoscopy 10/7 "normal mobility, polypoid changes to both cords, using false cords to speak, and severe erythema particularly of the L false cord"  PAIN:  Are you having pain? No; some soreness in throat   FALLS: Has patient fallen in last 6 months? No  LIVING ENVIRONMENT: Lives with: lives  with their spouse Lives in: House/apartment  PLOF: Independent  PATIENT GOALS    for voice to improve  OBJECTIVE:   TODAY'S TREATMENT:   Pt with mild-moderately hoarse vocal quality at beginning of session. Marked improvement noted with head/neck stretches and brief circumlaryngeal massage. Pt completed head/neck stretches independently. Pt completed circumlaryngeal massage for ~5 minutes. ?component of muscle tension dysphonia.    Pt completed resonant voice therapy at the word and phrase level. Rare verbal cues needed and intermittent glottal fry appreciated. During structured and unstructured conversation, mild hoarseness appreciated. Pt benefited from cues to utilize clear speech (e.g. slow, loud, overarticulate, "speak like you are talking over the fence")    Noted pt with occasional throat clearing and cough. Pt aware of phonatraumatic nature of coughing/throat clearing. Pt independently utilizing pursed lip breathing to suppress cough/throat clear on x2 occasions during session.       PATIENT EDUCATION: Education details: as above Person educated: Patient Education method: Designer, jewellery Education comprehension: verbalized understanding; needs further education   HOME EXERCISE PROGRAM: 1) modifications to caffeine intake 2) use of clear speech in conversations 3) resonant voice HEP 4) self circumlaryngeal massage     GOALS: Goals reviewed with patient? Yes  SHORT TERM GOALS: Target date: 10 sessions     The patient will demonstrate abdominal breathing patterns and steady release of breath on exhalation to optimize efficiency of voicing and decrease laryngeal hyperfunction.   Baseline: Goal status: INITIAL   2.  Patient will ID x3 strategies to improve vocal quality, vocal hygiene, voice projection, and prevent vocal fatigue.    Baseline:  Goal status: INITIAL   3.  The patient will utilize a forward tone focused/resonant voice to decrease vocal  hyperfunction and improve voice quality and vocal projection.   Baseline:  Goal status: INITIAL   4.  The patient will participate in 5-8 minutes conversation, maintaining average loudness of 75 dB and loud, good quality voice with min cues.       Baseline:  Goal status: INITIAL   5.  The patient will decrease laryngeal and articulatory muscle tension by independently completing relaxation/stretching exercises with min cueing.    Baseline:  Goal status: INITIAL  6.  The patient will reduce phonotraumatic behaviors such as chronic throat clearing, by substituting non-traumatic methods to clear mucus.   Baseline:  Goal status: INITIAL  LONG TERM GOALS: Target date: 12 weeks  Patient will report improved communication effectiveness as measured by PROM  Baseline: VHI 50/120 Goal status: INITIAL   2.  The patient will participate in 15-20 minutes conversation, maintaining average loudness of 75 dB and loud, good quality voice.   Baseline:  Goal status: INITIAL   3.  The patient will decrease laryngeal and articulatory muscle tension by independently completing relaxation/stretching exercises independently.  Baseline:  Goal status: INITIAL      ASSESSMENT:  CLINICAL IMPRESSION: Patient is a 67 y.o. female who was seen today for voice evaluation given dysphonia and hoarseness in setting of "plica ventricularis" (per ENT).  Pt with reports of sudden and persistent hoarseness for about a month. Laryngoscopy 12/02/22, "normal mobility, polypoid changes to both cords, using false cords to speak, and severe erythema particularly of the L false cord." Pt's voice today is c/b strained/hoarse vocal quality with pitch breaks and periods of aphonia.  Pt with tendency to utilize clavicular breathing with phonation/effort. Pt with some phonotraumatic behaviors (e.g. throat clearing, coughing, yelling) as well as tobacco use and moderate caffeine intake. 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 Pt stimulable to both clear speech and resonant voice therapy. Pt is motivated to improve vocal quality. See details of tx session as above. I recommend skilled ST to improve vocal quality, endurance, and vocal hygiene to meet other vocal demands of work and home.   OBJECTIVE IMPAIRMENTS include voice disorder. These impairments are limiting patient from return to work, ADLs/IADLs, and effectively communicating at home and in community. Factors affecting potential to achieve goals and functional outcome are severity of impairments. Patient will benefit from skilled SLP services to address above impairments and improve overall function.  REHAB POTENTIAL: Good  PLAN: SLP FREQUENCY: 1-2x/week  SLP DURATION: 12 weeks  PLANNED INTERVENTIONS: Functional tasks, SLP instruction and feedback, Compensatory strategies, and Patient/family education    Clyde Canterbury, M.S., CCC-SLP Speech-Language Pathologist [place] [facility] [phone] Arnette Felts)  [place] [facility] Outpatient Rehabilitation at [facility] [address] [place], [place], [address] Phone: [phone]   Fax:  [phone]

## 2022-12-27 DIAGNOSIS — H02886 Meibomian gland dysfunction of left eye, unspecified eyelid: Secondary | ICD-10-CM | POA: Diagnosis not present

## 2022-12-27 DIAGNOSIS — H02059 Trichiasis without entropian unspecified eye, unspecified eyelid: Secondary | ICD-10-CM | POA: Diagnosis not present

## 2022-12-27 DIAGNOSIS — H2513 Age-related nuclear cataract, bilateral: Secondary | ICD-10-CM | POA: Diagnosis not present

## 2022-12-27 DIAGNOSIS — H5213 Myopia, bilateral: Secondary | ICD-10-CM | POA: Diagnosis not present

## 2022-12-27 DIAGNOSIS — Z0100 Encounter for examination of eyes and vision without abnormal findings: Secondary | ICD-10-CM | POA: Diagnosis not present

## 2022-12-27 DIAGNOSIS — H02883 Meibomian gland dysfunction of right eye, unspecified eyelid: Secondary | ICD-10-CM | POA: Diagnosis not present

## 2022-12-30 ENCOUNTER — Ambulatory Visit: Payer: Medicare HMO

## 2022-12-31 ENCOUNTER — Ambulatory Visit: Payer: Medicare HMO | Attending: Unknown Physician Specialty

## 2022-12-31 DIAGNOSIS — R490 Dysphonia: Secondary | ICD-10-CM | POA: Insufficient documentation

## 2022-12-31 NOTE — Therapy (Signed)
OUTPATIENT SPEECH LANGUAGE PATHOLOGY  VOICE TREATMENT   Patient Name: Debra Mccann MRN: [id number] DOB:02-08-1956, 67 y.o., female Today's Date: 12/31/2022  PCP: Leata Mouse, NP REFERRING PROVIDER: Linus Salmons, MD   End of Session - 12/31/22 1419     SLP Start Time 1400    SLP Stop Time  1415    SLP Time Calculation (min) 15 min             Past Medical History:  Diagnosis Date   Asthma    Bronchitis    Cancer (HCC)    lung cancer   Cervical high risk HPV (human papillomavirus) test positive 10/24/2016   COPD (chronic obstructive pulmonary disease) (HCC)    Depression    History of primary non-small cell carcinoma of right lung s/p RUL resection 2004    HPV (human papilloma virus) infection 10/22/2016   Evaluated by GYN [facility]   LGSIL on Pap smear of cervix 10/24/2016   With HPV(+) and mild dysplasia   Lung cancer (HCC) 2004   Lung cancer (HCC)    OA (osteoarthritis) of hip    right   Osteopenia    Personal history of chemotherapy    Urticaria    Past Surgical History:  Procedure Laterality Date   ABDOMINAL HYSTERECTOMY  1997   partial, only ovaries remain; due to heavy bleeding   CHOLECYSTECTOMY  1999   COLONOSCOPY WITH PROPOFOL N/A 05/19/2017   Procedure: COLONOSCOPY WITH PROPOFOL;  Surgeon: Wyline Mood, MD;  Location: [facility];  Service: Gastroenterology;  Laterality: N/A;   INSERTION OF MESH  12/25/2021   Procedure: INSERTION OF MESH;  Surgeon: Leafy Ro, MD;  Location: [facility] ORS;  Service: General;;   LUNG CANCER SURGERY Right 2004   removed "top portion of right lung"   VENTRAL HERNIA REPAIR N/A 12/25/2021   Procedure: HERNIA REPAIR VENTRAL ADULT, open;  Surgeon: Leafy Ro, MD;  Location: [facility] ORS;  Service: General;  Laterality: N/A;   Patient Active Problem List   Diagnosis Date Noted   Migraine headache 12/31/2021   Incarcerated ventral hernia    SOB (shortness of breath)    Anxiety and depression 05/10/2021    Ganglion cyst of volar aspect of left wrist 08/23/2020   Dyslipidemia 10/24/2016   LGSIL on Pap smear of cervix 10/24/2016   HPV (human papilloma virus) infection 10/22/2016   Preventative health care 05/23/2016   Obesity, Class II, BMI 35-39.9, no comorbidity 05/23/2016   Odynophagia 02/22/2016   Food sticks on swallowing 02/22/2016   Weight gain 01/30/2016   Fatigue 01/30/2016   Snoring 06/15/2015   Calcium oxalate crystals in urine 06/14/2015   Erythrocytosis 06/14/2015   COPD (chronic obstructive pulmonary disease) (HCC) 01/30/2015   Night sweats 01/30/2015   Persistent cough 01/30/2015   Tobacco use disorder 12/09/2014   Vitamin D deficiency 11/18/2014   Major depressive disorder, recurrent episode, moderate with anxious distress (HCC)    Urticaria    OA (osteoarthritis) of hip    Osteopenia 08/08/2014    ONSET DATE: ~4 weeks ago; referral date 12/02/22  REFERRING DIAG: hoarseness  THERAPY DIAG:  Hoarseness  Dysphonia  Dysphonia plicae ventricularis  Rationale for Evaluation and Treatment Rehabilitation  SUBJECTIVE:   SUBJECTIVE STATEMENT: Pt alert, pleasant, and cooperative. Stated pt diagnosed with yeast infection of the vocal cords due to her inhaler by ENT last week. Pt endorsed she has been change to a nebulizer and given miracle mouthwash and flucanozole as tx. Pt f/u with  ENT 11/14.  Pt accompanied by: self  PERTINENT HISTORY: hx of non-small cell carcinoma of R lung s/p RUL resection in 2004, GERD, COPD, tobacco use disoder, anxiety and depression  DIAGNOSTIC FINDINGS: Laryngoscopy 10/7 "normal mobility, polypoid changes to both cords, using false cords to speak, and severe erythema particularly of the L false cord"  PAIN:  Are you having pain? No; some soreness in throat   FALLS: Has patient fallen in last 6 months? No  LIVING ENVIRONMENT: Lives with: lives with their spouse Lives in: House/apartment  PLOF: Independent  PATIENT GOALS    for  voice to improve  OBJECTIVE:   TODAY'S TREATMENT:  Deferred voice tx due to diagnosis of yeast infection of the vocal cords. Pt educated re: importance of vocal rest and vocal hygiene at this time. SLP sessions held until pt follows back up with ENT.   PATIENT EDUCATION: Education details: as above Person educated: Patient Education method: Designer, jewellery Education comprehension: verbalized understanding; needs further education   HOME EXERCISE PROGRAM: 1) modifications to caffeine intake 2) use of clear speech in conversations 3) resonant voice HEP 4) self circumlaryngeal massage     GOALS: Goals reviewed with patient? Yes  SHORT TERM GOALS: Target date: 10 sessions     The patient will demonstrate abdominal breathing patterns and steady release of breath on exhalation to optimize efficiency of voicing and decrease laryngeal hyperfunction.   Baseline: Goal status: INITIAL   2.  Patient will ID x3 strategies to improve vocal quality, vocal hygiene, voice projection, and prevent vocal fatigue.    Baseline:  Goal status: INITIAL   3.  The patient will utilize a forward tone focused/resonant voice to decrease vocal hyperfunction and improve voice quality and vocal projection.   Baseline:  Goal status: INITIAL   4.  The patient will participate in 5-8 minutes conversation, maintaining average loudness of 75 dB and loud, good quality voice with min cues.       Baseline:  Goal status: INITIAL   5.  The patient will decrease laryngeal and articulatory muscle tension by independently completing relaxation/stretching exercises with min cueing.    Baseline:  Goal status: INITIAL  6.  The patient will reduce phonotraumatic behaviors such as chronic throat clearing, by substituting non-traumatic methods to clear mucus.   Baseline:  Goal status: INITIAL  LONG TERM GOALS: Target date: 12 weeks  Patient will report improved communication effectiveness as measured  by PROM  Baseline: VHI 50/120 Goal status: INITIAL   2.  The patient will participate in 15-20 minutes conversation, maintaining average loudness of 75 dB and loud, good quality voice.   Baseline:  Goal status: INITIAL   3.  The patient will decrease laryngeal and articulatory muscle tension by independently completing relaxation/stretching exercises independently.  Baseline:  Goal status: INITIAL      ASSESSMENT:  CLINICAL IMPRESSION: Patient is a 67 y.o. female who was seen today for voice evaluation given dysphonia and hoarseness in setting of "plica ventricularis" (per ENT).  Pt with reports of sudden and persistent hoarseness for about a month. Laryngoscopy 12/02/22, "normal mobility, polypoid changes to both cords, using false cords to speak, and severe erythema particularly of the L false cord." Pt's voice today is c/b strained/hoarse vocal quality with pitch breaks and periods of aphonia.  Pt with tendency to utilize clavicular breathing with phonation/effort. Pt with some phonotraumatic behaviors (e.g. throat clearing, coughing, yelling) as well as tobacco use and moderate caffeine intake. 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 Pt stimulable to both clear speech and resonant voice therapy. Pt is motivated to improve vocal quality. See details of tx session as above. I recommend skilled ST to improve vocal quality, endurance, and vocal hygiene to meet other vocal demands of work and home.   OBJECTIVE IMPAIRMENTS include voice disorder. These impairments are limiting patient from return to work, ADLs/IADLs, and effectively communicating at home and in community. Factors affecting potential to achieve goals and functional outcome are severity of impairments. Patient will benefit from skilled SLP services to address above impairments and improve overall function.  REHAB POTENTIAL: Good  PLAN: SLP  FREQUENCY: 1-2x/week (on hold until cleared by ENT to resume)  SLP DURATION: 12 weeks  PLANNED INTERVENTIONS: Functional tasks, SLP instruction and feedback, Compensatory strategies, and Patient/family education    Clyde Canterbury, M.S., CCC-SLP Speech-Language Pathologist [place] [facility] [phone] Arnette Felts)  [place] [facility] at [facility] [address] [place], [place], [address] Phone: [phone]   Fax:  [phone]

## 2023-01-02 ENCOUNTER — Ambulatory Visit: Payer: Medicare HMO

## 2023-01-07 ENCOUNTER — Ambulatory Visit: Payer: Medicare HMO

## 2023-01-08 ENCOUNTER — Ambulatory Visit
Admission: RE | Admit: 2023-01-08 | Discharge: 2023-01-08 | Disposition: A | Discharge status: Home / Self Care | Payer: Medicare HMO | Source: Ambulatory Visit | Attending: Nurse Practitioner | Admitting: Nurse Practitioner

## 2023-01-08 DIAGNOSIS — Z1231 Encounter for screening mammogram for malignant neoplasm of breast: Secondary | ICD-10-CM | POA: Insufficient documentation

## 2023-01-09 ENCOUNTER — Ambulatory Visit: Payer: Medicare HMO

## 2023-01-09 DIAGNOSIS — R490 Dysphonia: Secondary | ICD-10-CM | POA: Diagnosis not present

## 2023-01-09 DIAGNOSIS — B379 Candidiasis, unspecified: Secondary | ICD-10-CM | POA: Diagnosis not present

## 2023-01-14 ENCOUNTER — Ambulatory Visit: Payer: Medicare HMO

## 2023-01-14 DIAGNOSIS — R490 Dysphonia: Secondary | ICD-10-CM

## 2023-01-14 NOTE — Therapy (Addendum)
OUTPATIENT SPEECH LANGUAGE PATHOLOGY  VOICE TREATMENT   Patient Name: Debra Mccann MRN: [id number] DOB:October 11, 1955, 67 y.o., female Today's Date: 01/14/2023  PCP: Leata Mouse, NP REFERRING PROVIDER: Linus Salmons, MD   End of Session - 01/14/23 1612     Visit Number 8    Number of Visits 25    Date for SLP Re-Evaluation 03/03/23    Authorization Type [employer]    Progress Note Due on Visit 10    SLP Start Time 1445    SLP Stop Time  1525    SLP Time Calculation (min) 40 min    Activity Tolerance Patient tolerated treatment well             Past Medical History:  Diagnosis Date   Asthma    Bronchitis    Cancer (HCC)    lung cancer   Cervical high risk HPV (human papillomavirus) test positive 10/24/2016   COPD (chronic obstructive pulmonary disease) (HCC)    Depression    History of primary non-small cell carcinoma of right lung s/p RUL resection 2004    HPV (human papilloma virus) infection 10/22/2016   Evaluated by GYN [facility]   LGSIL on Pap smear of cervix 10/24/2016   With HPV(+) and mild dysplasia   Lung cancer (HCC) 2004   Lung cancer (HCC)    OA (osteoarthritis) of hip    right   Osteopenia    Personal history of chemotherapy    Urticaria    Past Surgical History:  Procedure Laterality Date   ABDOMINAL HYSTERECTOMY  1997   partial, only ovaries remain; due to heavy bleeding   CHOLECYSTECTOMY  1999   COLONOSCOPY WITH PROPOFOL N/A 05/19/2017   Procedure: COLONOSCOPY WITH PROPOFOL;  Surgeon: Wyline Mood, MD;  Location[facility];  Service: Gastroenterology;  Laterality: N/A;   INSERTION OF MESH  12/25/2021   Procedure: INSERTION OF MESH;  Surgeon: Leafy Ro, MD;  Location: [facility] ORS;  Service: General;;   LUNG CANCER SURGERY Right 2004   removed "top portion of right lung"   VENTRAL HERNIA REPAIR N/A 12/25/2021   Procedure: HERNIA REPAIR VENTRAL ADULT, open;  Surgeon: Leafy Ro, MD;  Location: [facility] ORS;  Service:  General;  Laterality: N/A;   Patient Active Problem List   Diagnosis Date Noted   Migraine headache 12/31/2021   Incarcerated ventral hernia    SOB (shortness of breath)    Anxiety and depression 05/10/2021   Ganglion cyst of volar aspect of left wrist 08/23/2020   Dyslipidemia 10/24/2016   LGSIL on Pap smear of cervix 10/24/2016   HPV (human papilloma virus) infection 10/22/2016   Preventative health care 05/23/2016   Obesity, Class II, BMI 35-39.9, no comorbidity 05/23/2016   Odynophagia 02/22/2016   Food sticks on swallowing 02/22/2016   Weight gain 01/30/2016   Fatigue 01/30/2016   Snoring 06/15/2015   Calcium oxalate crystals in urine 06/14/2015   Erythrocytosis 06/14/2015   COPD (chronic obstructive pulmonary disease) (HCC) 01/30/2015   Night sweats 01/30/2015   Persistent cough 01/30/2015   Tobacco use disorder 12/09/2014   Vitamin D deficiency 11/18/2014   Major depressive disorder, recurrent episode, moderate with anxious distress (HCC)    Urticaria    OA (osteoarthritis) of hip    Osteopenia 08/08/2014    ONSET DATE: ~4 weeks ago; referral date 12/02/22  REFERRING DIAG: hoarseness  THERAPY DIAG:  Hoarseness  Dysphonia  Dysphonia plicae ventricularis  Rationale for Evaluation and Treatment Rehabilitation  SUBJECTIVE:  SUBJECTIVE STATEMENT: Pt alert, pleasant, and cooperative. Marked improvement in vocal quality.  Pt accompanied by: self  PERTINENT HISTORY: hx of non-small cell carcinoma of R lung s/p RUL resection in 2004, GERD, COPD, tobacco use disoder, anxiety and depression  DIAGNOSTIC FINDINGS: Laryngoscopy 10/7 "normal mobility, polypoid changes to both cords, using false cords to speak, and severe erythema particularly of the L false cord"  PAIN:  Are you having pain? No; some soreness in throat   FALLS: Has patient fallen in last 6 months? No  LIVING ENVIRONMENT: Lives with: lives with their spouse Lives in: House/apartment  PLOF:  Independent  PATIENT GOALS    for voice to improve  OBJECTIVE:   TODAY'S TREATMENT:   Pt provided interval hx since last appointment. Pt had ENT appointment and ENT cleared her for ST. Pt noted yeast infection had clear, but "2 spots" still on vocal cords. Pt planning to f/u with ENT for direct visualization and consideration for biopsy on Thursday, 11/21. Pt commenting that she would like to have the biopsy done sooner than later. Patient has also been written out of work for an additional 6 weeks. Pt eager to return to work. Supportive counseling provided.    Pt noted completing heck/neck stretches and circumlaryngeal massage in the interim since last tx session. Pt with marked improvement in vocal quality since last session.    Introduced semi-occluded voice therapy. With min/mod cueing, pt completed humming and pitch glides with straw and coffee stirrer. Increased difficulty noted with coffee stirrer. Pt also performed cup therapy, including bubbling/humming with stirrer in water, with min/mod cueing. Pt completed resonant voice therapy at the word and phrase level. Rare verbal cues needed and intermittent glottal fry appreciated. During structured and unstructured conversation, mild hoarseness appreciated.      PATIENT EDUCATION: Education details: as above Person educated: Patient Education method: Designer, jewellery Education comprehension: verbalized understanding; needs further education   HOME EXERCISE PROGRAM: 1) modifications to caffeine intake 2) use of clear speech in conversations 3) resonant voice HEP 4) self circumlaryngeal massage 5) semi-occluded voice therapy     GOALS: Goals reviewed with patient? Yes  SHORT TERM GOALS: Target date: 10 sessions     The patient will demonstrate abdominal breathing patterns and steady release of breath on exhalation to optimize efficiency of voicing and decrease laryngeal hyperfunction.   Baseline: Goal status:  INITIAL   2.  Patient will ID x3 strategies to improve vocal quality, vocal hygiene, voice projection, and prevent vocal fatigue.    Baseline:  Goal status: INITIAL   3.  The patient will utilize a forward tone focused/resonant voice to decrease vocal hyperfunction and improve voice quality and vocal projection.   Baseline:  Goal status: INITIAL   4.  The patient will participate in 5-8 minutes conversation, maintaining average loudness of 75 dB and loud, good quality voice with min cues.       Baseline:  Goal status: INITIAL   5.  The patient will decrease laryngeal and articulatory muscle tension by independently completing relaxation/stretching exercises with min cueing.    Baseline:  Goal status: INITIAL  6.  The patient will reduce phonotraumatic behaviors such as chronic throat clearing, by substituting non-traumatic methods to clear mucus.   Baseline:  Goal status: INITIAL  LONG TERM GOALS: Target date: 12 weeks  Patient will report improved communication effectiveness as measured by PROM  Baseline: VHI 50/120 Goal status: INITIAL   2.  The patient will participate in 15-20 minutes  conversation, maintaining average loudness of 75 dB and loud, good quality voice.   Baseline:  Goal status: INITIAL   3.  The patient will decrease laryngeal and articulatory muscle tension by independently completing relaxation/stretching exercises independently.  Baseline:  Goal status: INITIAL      ASSESSMENT:  CLINICAL IMPRESSION: Patient is a 67 y.o. female who was seen today for voice evaluation given dysphonia and hoarseness in setting of "plica ventricularis" (per ENT).  Pt with reports of sudden and persistent hoarseness for about a month. Laryngoscopy 12/02/22, "normal mobility, polypoid changes to both cords, using false cords to speak, and severe erythema particularly of the L false cord." Pt's voice today is c/b strained/hoarse vocal quality with pitch breaks and periods of  aphonia.  Pt with tendency to utilize clavicular breathing with phonation/effort. Pt with some phonotraumatic behaviors (e.g. throat clearing, coughing, yelling) as well as tobacco use and moderate caffeine intake. 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 Pt stimulable to both clear speech and resonant voice therapy. Pt is motivated to improve vocal quality. See details of tx session as above. I recommend skilled ST to improve vocal quality, endurance, and vocal hygiene to meet other vocal demands of work and home.   OBJECTIVE IMPAIRMENTS include voice disorder. These impairments are limiting patient from return to work, ADLs/IADLs, and effectively communicating at home and in community. Factors affecting potential to achieve goals and functional outcome are severity of impairments. Patient will benefit from skilled SLP services to address above impairments and improve overall function.  REHAB POTENTIAL: Good  PLAN: SLP FREQUENCY: 1-2x/week  SLP DURATION: 12 weeks  PLANNED INTERVENTIONS: Functional tasks, SLP instruction and feedback, Compensatory strategies, and Patient/family education    Clyde Canterbury, M.S., CCC-SLP Speech-Language Pathologist [place] [facility] [phone] Arnette Felts)  [place] [facility] Outpatient Rehabilitation at [facility] [address] [place], [place], [address] Phone: [phone]   Fax:  [phone]

## 2023-01-16 ENCOUNTER — Other Ambulatory Visit: Payer: Self-pay | Admitting: Unknown Physician Specialty

## 2023-01-16 ENCOUNTER — Ambulatory Visit: Payer: Medicare HMO

## 2023-01-16 DIAGNOSIS — R490 Dysphonia: Secondary | ICD-10-CM | POA: Diagnosis not present

## 2023-01-16 NOTE — Therapy (Signed)
OUTPATIENT SPEECH LANGUAGE PATHOLOGY  VOICE TREATMENT   Patient Name: Debra Mccann MRN: [id number] DOB:[birth year], 67 y.o., female Today's Date: 01/16/2023  PCP: Leata Mouse, NP REFERRING PROVIDER: Linus Salmons, MD   End of Session - 01/16/23 1450     Visit Number 9    Number of Visits 25    Date for SLP Re-Evaluation 03/03/23    Authorization Type [employer]    Progress Note Due on Visit 10    SLP Start Time 1355    SLP Stop Time  1450    SLP Time Calculation (min) 55 min    Activity Tolerance Patient tolerated treatment well             Past Medical History:  Diagnosis Date   Asthma    Bronchitis    Cancer (HCC)    lung cancer   Cervical high risk HPV (human papillomavirus) test positive 10/24/2016   COPD (chronic obstructive pulmonary disease) (HCC)    Depression    History of primary non-small cell carcinoma of right lung s/p RUL resection 2004    HPV (human papilloma virus) infection 10/22/2016   Evaluated by GYN [facility]   LGSIL on Pap smear of cervix 10/24/2016   With HPV(+) and mild dysplasia   Lung cancer (HCC) 2004   Lung cancer (HCC)    OA (osteoarthritis) of hip    right   Osteopenia    Personal history of chemotherapy    Urticaria    Past Surgical History:  Procedure Laterality Date   ABDOMINAL HYSTERECTOMY  1997   partial, only ovaries remain; due to heavy bleeding   CHOLECYSTECTOMY  1999   COLONOSCOPY WITH PROPOFOL N/A 05/19/2017   Procedure: COLONOSCOPY WITH PROPOFOL;  Surgeon: Wyline Mood, MD;  Location: [facility]COPY;  Service: Gastroenterology;  Laterality: N/A;   INSERTION OF MESH  12/25/2021   Procedure: INSERTION OF MESH;  Surgeon: Leafy Ro, MD;  Location: [facility] ORS;  Service: General;;   LUNG CANCER SURGERY Right 2004   removed "top portion of right lung"   VENTRAL HERNIA REPAIR N/A 12/25/2021   Procedure: HERNIA REPAIR VENTRAL ADULT, open;  Surgeon: Leafy Ro, MD;  Location: [facility] ORS;  Service:  General;  Laterality: N/A;   Patient Active Problem List   Diagnosis Date Noted   Migraine headache 12/31/2021   Incarcerated ventral hernia    SOB (shortness of breath)    Anxiety and depression 05/10/2021   Ganglion cyst of volar aspect of left wrist 08/23/2020   Dyslipidemia 10/24/2016   LGSIL on Pap smear of cervix 10/24/2016   HPV (human papilloma virus) infection 10/22/2016   Preventative health care 05/23/2016   Obesity, Class II, BMI 35-39.9, no comorbidity 05/23/2016   Odynophagia 02/22/2016   Food sticks on swallowing 02/22/2016   Weight gain 01/30/2016   Fatigue 01/30/2016   Snoring 06/15/2015   Calcium oxalate crystals in urine 06/14/2015   Erythrocytosis 06/14/2015   COPD (chronic obstructive pulmonary disease) (HCC) 01/30/2015   Night sweats 01/30/2015   Persistent cough 01/30/2015   Tobacco use disorder 12/09/2014   Vitamin D deficiency 11/18/2014   Major depressive disorder, recurrent episode, moderate with anxious distress (HCC)    Urticaria    OA (osteoarthritis) of hip    Osteopenia 08/08/2014    ONSET DATE: ~4 weeks ago; referral date 12/02/22  REFERRING DIAG: hoarseness  THERAPY DIAG:  Hoarseness  Dysphonia  Dysphonia plicae ventricularis  Rationale for Evaluation and Treatment Rehabilitation  SUBJECTIVE:  SUBJECTIVE STATEMENT: Pt alert, pleasant, and cooperative. Marked improvement in vocal quality.  Pt accompanied by: self  PERTINENT HISTORY: hx of non-small cell carcinoma of R lung s/p RUL resection in 2004, GERD, COPD, tobacco use disoder, anxiety and depression  DIAGNOSTIC FINDINGS: Laryngoscopy 10/7 "normal mobility, polypoid changes to both cords, using false cords to speak, and severe erythema particularly of the L false cord"  PAIN:  Are you having pain? No; some soreness in throat   FALLS: Has patient fallen in last 6 months? No  LIVING ENVIRONMENT: Lives with: lives with their spouse Lives in: House/apartment  PLOF:  Independent  PATIENT GOALS    for voice to improve  OBJECTIVE:   TODAY'S TREATMENT:   Pt provided interval hx since last appointment. Pt had ENT appointment today. Planning for biopsy, 12/6. ENT told pt that voice may be worse following biopsy. Will continue to follow.   Pt reports performing HEP including semi-occluded voice therapy at home. Pt unable to utilize coffee stirrer during SOVT at home.    Pt completed heck/neck stretches and circumlaryngeal massage as a warm up indep. Reviewed rationale semi-occluded voice therapy. With rare-min verbal/visual cueing, pt completed humming, pitch glides, and singing via both straw and coffee stirrer. Utilized tissue for feedback regarding airflow with coffee stirrer. Marked improvement in vocal loudness and quality following SOVT. Will plan to continue in upcoming sessions.      PATIENT EDUCATION: Education details: as above Person educated: Patient Education method: Designer, jewellery Education comprehension: verbalized understanding; needs further education   HOME EXERCISE PROGRAM: 1) modifications to caffeine intake 2) use of clear speech in conversations 3) resonant voice HEP 4) self circumlaryngeal massage 5) semi-occluded voice therapy     GOALS: Goals reviewed with patient? Yes  SHORT TERM GOALS: Target date: 10 sessions     The patient will demonstrate abdominal breathing patterns and steady release of breath on exhalation to optimize efficiency of voicing and decrease laryngeal hyperfunction.   Baseline: Goal status: INITIAL   2.  Patient will ID x3 strategies to improve vocal quality, vocal hygiene, voice projection, and prevent vocal fatigue.    Baseline:  Goal status: INITIAL   3.  The patient will utilize a forward tone focused/resonant voice to decrease vocal hyperfunction and improve voice quality and vocal projection.   Baseline:  Goal status: INITIAL   4.  The patient will participate in 5-8 minutes  conversation, maintaining average loudness of 75 dB and loud, good quality voice with min cues.       Baseline:  Goal status: INITIAL   5.  The patient will decrease laryngeal and articulatory muscle tension by independently completing relaxation/stretching exercises with min cueing.    Baseline:  Goal status: INITIAL  6.  The patient will reduce phonotraumatic behaviors such as chronic throat clearing, by substituting non-traumatic methods to clear mucus.   Baseline:  Goal status: INITIAL  LONG TERM GOALS: Target date: 12 weeks  Patient will report improved communication effectiveness as measured by PROM  Baseline: VHI 50/120 Goal status: INITIAL   2.  The patient will participate in 15-20 minutes conversation, maintaining average loudness of 75 dB and loud, good quality voice.   Baseline:  Goal status: INITIAL   3.  The patient will decrease laryngeal and articulatory muscle tension by independently completing relaxation/stretching exercises independently.  Baseline:  Goal status: INITIAL      ASSESSMENT:  CLINICAL IMPRESSION: Patient is a 67 y.o. female who was seen today for voice  evaluation given dysphonia and hoarseness in setting of "plica ventricularis" (per ENT).  Pt with reports of sudden and persistent hoarseness for about a month. Laryngoscopy 12/02/22, "normal mobility, polypoid changes to both cords, using false cords to speak, and severe erythema particularly of the L false cord." Pt's voice today is c/b strained/hoarse vocal quality with pitch breaks and periods of aphonia.  Pt with tendency to utilize clavicular breathing with phonation/effort. Pt with some phonotraumatic behaviors (e.g. throat clearing, coughing, yelling) as well as tobacco use and moderate caffeine intake. 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 Pt stimulable to both clear speech and resonant voice therapy. Pt  is motivated to improve vocal quality. See details of tx session as above. I recommend skilled ST to improve vocal quality, endurance, and vocal hygiene to meet other vocal demands of work and home.   OBJECTIVE IMPAIRMENTS include voice disorder. These impairments are limiting patient from return to work, ADLs/IADLs, and effectively communicating at home and in community. Factors affecting potential to achieve goals and functional outcome are severity of impairments. Patient will benefit from skilled SLP services to address above impairments and improve overall function.  REHAB POTENTIAL: Good  PLAN: SLP FREQUENCY: 1-2x/week  SLP DURATION: 12 weeks  PLANNED INTERVENTIONS: Functional tasks, SLP instruction and feedback, Compensatory strategies, and Patient/family education    Clyde Canterbury, M.S., CCC-SLP Speech-Language Pathologist [facility] [phone] Arnette Felts)  [facility] [address] [place], [place], [address] Phone: [phone]   Fax:  [phone]

## 2023-01-20 ENCOUNTER — Encounter: Payer: Self-pay | Admitting: Cardiology

## 2023-01-20 ENCOUNTER — Ambulatory Visit (INDEPENDENT_AMBULATORY_CARE_PROVIDER_SITE_OTHER): Payer: Medicare HMO | Admitting: Cardiology

## 2023-01-20 VITALS — BP 136/84 | HR 87 | Ht 65.0 in | Wt 173.6 lb

## 2023-01-20 DIAGNOSIS — Z1329 Encounter for screening for other suspected endocrine disorder: Secondary | ICD-10-CM | POA: Diagnosis not present

## 2023-01-20 DIAGNOSIS — E785 Hyperlipidemia, unspecified: Secondary | ICD-10-CM

## 2023-01-20 DIAGNOSIS — Z131 Encounter for screening for diabetes mellitus: Secondary | ICD-10-CM | POA: Diagnosis not present

## 2023-01-20 DIAGNOSIS — Z0130 Encounter for examination of blood pressure without abnormal findings: Secondary | ICD-10-CM

## 2023-01-20 MED ORDER — FLUTICASONE FUROATE-VILANTEROL 200-25 MCG/ACT IN AEPB: 1.0000 | INHALATION_SPRAY | Freq: Every day | RESPIRATORY_TRACT | 2 refills | Status: DC

## 2023-01-20 NOTE — Progress Notes (Signed)
Established Patient Office Visit  Subjective:  Patient ID: Debra Mccann, female    DOB: 09/05/1955  Age: 67 y.o. MRN: [id number]  Chief Complaint  Patient presents with   Follow-up    4 month follow up    Patient in office for 4 month follow up. Patient doing well. No acute complaints today. Patient is fasting, will do blood work. Up to date on colonoscopy and mammogram.     No other concerns at this time.   Past Medical History:  Diagnosis Date   Asthma    Bronchitis    Cancer (HCC)    lung cancer   Cervical high risk HPV (human papillomavirus) test positive 10/24/2016   COPD (chronic obstructive pulmonary disease) (HCC)    Depression    History of primary non-small cell carcinoma of right lung s/p RUL resection 2004    HPV (human papilloma virus) infection 10/22/2016   Evaluated by GYN [facility]   LGSIL on Pap smear of cervix 10/24/2016   With HPV(+) and mild dysplasia   Lung cancer (HCC) 2004   Lung cancer (HCC)    OA (osteoarthritis) of hip    right   Osteopenia    Personal history of chemotherapy    Urticaria     Past Surgical History:  Procedure Laterality Date   ABDOMINAL HYSTERECTOMY  1997   partial, only ovaries remain; due to heavy bleeding   CHOLECYSTECTOMY  1999   COLONOSCOPY WITH PROPOFOL N/A 05/19/2017   Procedure: COLONOSCOPY WITH PROPOFOL;  Surgeon: Wyline Mood, MD;  Location: [facility] ENDOSCOPY;  Service: Gastroenterology;  Laterality: N/A;   INSERTION OF MESH  12/25/2021   Procedure: INSERTION OF MESH;  Surgeon: Leafy Ro, MD;  Location: [facility] ORS;  Service: General;;   LUNG CANCER SURGERY Right 2004   removed "top portion of right lung"   VENTRAL HERNIA REPAIR N/A 12/25/2021   Procedure: HERNIA REPAIR VENTRAL ADULT, open;  Surgeon: Leafy Ro, MD;  Location: [facility] ORS;  Service: General;  Laterality: N/A;    Social History   Socioeconomic History   Marital status: Married    Spouse name: Not on file   Number of children:  Not on file   Years of education: Not on file   Highest education level: Not on file  Occupational History   Not on file  Tobacco Use   Smoking status: Every Day    Current packs/day: 0.50    Average packs/day: 0.5 packs/day for 23.0 years (11.5 ttl pk-yrs)    Types: Cigarettes   Smokeless tobacco: Never  Vaping Use   Vaping status: Never Used  Substance and Sexual Activity   Alcohol use: No    Alcohol/week: 0.0 standard drinks of alcohol   Drug use: No   Sexual activity: Yes  Other Topics Concern   Not on file  Social History Narrative   Not on file   Social Determinants of Health   Financial Resource Strain: Not on file  Food Insecurity: No Food Insecurity (01/01/2022)   Hunger Vital Sign    Worried About Running Out of Food in the Last Year: Never true    Ran Out of Food in the Last Year: Never true  Transportation Needs: No Transportation Needs (01/01/2022)   PRAPARE - Administrator, Civil Service (Medical): No    Lack of Transportation (Non-Medical): No  Physical Activity: Not on file  Stress: Not on file  Social Connections: Not on file  Intimate Partner Violence:  Not At Risk (01/01/2022)   Humiliation, Afraid, Rape, and Kick questionnaire    Fear of Current or Ex-Partner: No    Emotionally Abused: No    Physically Abused: No    Sexually Abused: No    Family History  Problem Relation Age of Onset   CAD Mother        with stents   Heart disease Mother    Hypertension Mother    Heart disease Father        3 vessel CABG   Cancer Father        melanoma   Hypertension Father    Gout Father    Depression Daughter    Alzheimer's disease Maternal Grandmother    Breast cancer Cousin    Diabetes Neg Hx    Stroke Neg Hx    COPD Neg Hx     Allergies  Allergen Reactions   Penicillins Hives    Outpatient Medications Prior to Visit  Medication Sig   albuterol (VENTOLIN HFA) 108 (90 Base) MCG/ACT inhaler Inhale 2 puffs into the lungs every 6  (six) hours as needed.   cetirizine (ZYRTEC) 10 MG tablet Take 10 mg by mouth daily.   cholecalciferol (VITAMIN D3) 10 MCG (400 UNIT) TABS tablet Take 400 Units by mouth daily.   citalopram (CELEXA) 20 MG tablet Take 1 tablet (20 mg total) by mouth daily.   hydrOXYzine (VISTARIL) 25 MG capsule Take 25 mg by mouth 3 (three) times daily.   ipratropium-albuterol (DUONEB) 0.5-2.5 (3) MG/3ML SOLN Inhale 3 mLs into the lungs every 6 (six) hours as needed.   omeprazole (PRILOSEC) 40 MG capsule Take 1 capsule (40 mg total) by mouth daily.   rosuvastatin (CRESTOR) 10 MG tablet Take 1 tablet (10 mg total) by mouth at bedtime.   SYMBICORT 80-4.5 MCG/ACT inhaler INHALE 2 PUFFS INTO THE LUNGS IN THE MORNING AND AT BEDTIME.   vitamin B-12 (CYANOCOBALAMIN) 100 MCG tablet Take 100 mcg by mouth daily. (Patient not taking: Reported on 01/20/2023)   [DISCONTINUED] fluticasone furoate-vilanterol (BREO ELLIPTA) 200-25 MCG/ACT AEPB Inhale 1 puff into the lungs daily. (Patient not taking: Reported on 01/20/2023)   No facility-administered medications prior to visit.    Review of Systems  Constitutional: Negative.   HENT: Negative.    Eyes: Negative.   Respiratory: Negative.  Negative for shortness of breath.   Cardiovascular: Negative.  Negative for chest pain.  Gastrointestinal: Negative.  Negative for abdominal pain, constipation and diarrhea.  Genitourinary: Negative.   Musculoskeletal:  Negative for joint pain and myalgias.  Skin: Negative.   Neurological: Negative.  Negative for dizziness and headaches.  Endo/Heme/Allergies: Negative.   All other systems reviewed and are negative.      Objective:   BP 136/84   Pulse 87   Ht 5\' 5"  (1.651 m)   Wt 173 lb 9.6 oz (78.7 kg)   SpO2 94%   BMI 28.89 kg/m   Vitals:   01/20/23 1534  BP: 136/84  Pulse: 87  Height: 5\' 5"  (1.651 m)  Weight: 173 lb 9.6 oz (78.7 kg)  SpO2: 94%  BMI (Calculated): 28.89    Physical Exam Vitals and nursing note  reviewed.  Constitutional:      Appearance: Normal appearance. She is normal weight.  HENT:     Head: Normocephalic and atraumatic.     Nose: Nose normal.     Mouth/Throat:     Mouth: Mucous membranes are moist.  Eyes:     Extraocular Movements: Extraocular  movements intact.     Conjunctiva/sclera: Conjunctivae normal.     Pupils: Pupils are equal, round, and reactive to light.  Cardiovascular:     Rate and Rhythm: Normal rate and regular rhythm.     Pulses: Normal pulses.     Heart sounds: Normal heart sounds.  Pulmonary:     Effort: Pulmonary effort is normal.     Breath sounds: Normal breath sounds.  Abdominal:     General: Abdomen is flat. Bowel sounds are normal.     Palpations: Abdomen is soft.  Musculoskeletal:        General: Normal range of motion.     Cervical back: Normal range of motion.  Skin:    General: Skin is warm and dry.  Neurological:     General: No focal deficit present.     Mental Status: She is alert and oriented to person, place, and time.  Psychiatric:        Mood and Affect: Mood normal.        Behavior: Behavior normal.        Thought Content: Thought content normal.        Judgment: Judgment normal.      No results found for any visits on 01/20/23.  No results found for this or any previous visit (from the past 2160 hour(s)).    Assessment & Plan:  Fasting lab work today.   Problem List Items Addressed This Visit       Other   Dyslipidemia - Primary   Relevant Orders   Lipid panel   CBC with Differential/Platelet   Other Visit Diagnoses     Thyroid disorder screening       Relevant Orders   CBC with Differential/Platelet   TSH   Diabetes mellitus screening       Relevant Orders   CMP14+EGFR   CBC with Differential/Platelet   Hemoglobin A1c       Return in about 4 months (around 05/20/2023) for with fasting labs prior.   Total time spent: 25 minutes  [employer], NP  01/20/2023   This document may have been  prepared by Dragon Voice Recognition software and as such may include unintentional dictation errors.

## 2023-01-21 ENCOUNTER — Encounter: Payer: Self-pay | Admitting: Unknown Physician Specialty

## 2023-01-21 ENCOUNTER — Ambulatory Visit: Payer: Medicare HMO

## 2023-01-21 DIAGNOSIS — R490 Dysphonia: Secondary | ICD-10-CM | POA: Diagnosis not present

## 2023-01-21 LAB — LIPID PANEL
Chol/HDL Ratio: 2.2 {ratio} (ref 0.0–4.4)
Cholesterol, Total: 154 mg/dL (ref 100–199)
HDL: 71 mg/dL (ref >39)
LDL Chol Calc (NIH): 67 mg/dL (ref 0–99)
Triglycerides: 84 mg/dL (ref 0–149)
VLDL Cholesterol Cal: 16 mg/dL (ref 5–40)

## 2023-01-21 LAB — CMP14+EGFR
ALT: 15 [IU]/L (ref 0–32)
AST: 18 [IU]/L (ref 0–40)
Albumin: 4.1 g/dL (ref 3.9–4.9)
Alkaline Phosphatase: 51 [IU]/L (ref 44–121)
BUN/Creatinine Ratio: 8 — ABNORMAL LOW (ref 12–28)
BUN: 7 mg/dL — ABNORMAL LOW (ref 8–27)
Bilirubin Total: 0.6 mg/dL (ref 0.0–1.2)
CO2: 28 mmol/L (ref 20–29)
Calcium: 9.6 mg/dL (ref 8.7–10.3)
Chloride: 99 mmol/L (ref 96–106)
Creatinine, Ser: 0.92 mg/dL (ref 0.57–1.00)
Globulin, Total: 2 g/dL (ref 1.5–4.5)
Glucose: 84 mg/dL (ref 70–99)
Potassium: 4.5 mmol/L (ref 3.5–5.2)
Sodium: 140 mmol/L (ref 134–144)
Total Protein: 6.1 g/dL (ref 6.0–8.5)
eGFR: 69 mL/min/{1.73_m2} (ref >59)

## 2023-01-21 LAB — HEMOGLOBIN A1C
Est. average glucose Bld gHb Est-mCnc: 114 mg/dL
Hgb A1c MFr Bld: 5.6 % (ref 4.8–5.6)

## 2023-01-21 LAB — CBC WITH DIFFERENTIAL/PLATELET
Basophils Absolute: 0.1 10*3/uL (ref 0.0–0.2)
Basos: 1 %
EOS (ABSOLUTE): 0.5 10*3/uL — ABNORMAL HIGH (ref 0.0–0.4)
Eos: 5 %
Hematocrit: 47.6 % — ABNORMAL HIGH (ref 34.0–46.6)
Hemoglobin: 15.5 g/dL (ref 11.1–15.9)
Immature Grans (Abs): 0 10*3/uL (ref 0.0–0.1)
Immature Granulocytes: 0 %
Lymphocytes Absolute: 2.6 10*3/uL (ref 0.7–3.1)
Lymphs: 28 %
MCH: 28.9 pg (ref 26.6–33.0)
MCHC: 32.6 g/dL (ref 31.5–35.7)
MCV: 89 fL (ref 79–97)
Monocytes Absolute: 0.7 10*3/uL (ref 0.1–0.9)
Monocytes: 8 %
Neutrophils Absolute: 5.4 10*3/uL (ref 1.4–7.0)
Neutrophils: 58 %
Platelets: 281 10*3/uL (ref 150–450)
RBC: 5.36 x10E6/uL — ABNORMAL HIGH (ref 3.77–5.28)
RDW: 13.5 % (ref 11.7–15.4)
WBC: 9.2 10*3/uL (ref 3.4–10.8)

## 2023-01-21 LAB — TSH: TSH: 1.15 u[IU]/mL (ref 0.450–4.500)

## 2023-01-21 NOTE — Progress Notes (Signed)
Patient notified

## 2023-01-21 NOTE — Therapy (Signed)
OUTPATIENT SPEECH LANGUAGE PATHOLOGY  VOICE TREATMENT / 10TH VISIT PROGRESS NOTE Speech Therapy Progress Note  Dates of Reporting Period: 12/09/22 to 01/21/23.  Objective: Patient has been seen for 10 speech therapy sessions this reporting period targeting dysphonia. Patient is making progress toward LTGs and met all STGs this reporting period. See skilled intervention, clinical impressions, and goals below for details.  Patient Name: Debra Mccann MRN: [id number] DOB:03/13/1955, 67 y.o., female Today's Date: 01/21/2023  PCP: Leata Mouse, NP REFERRING PROVIDER: Linus Salmons, MD   End of Session - 01/21/23 1440     Visit Number 10    Number of Visits 25    Date for SLP Re-Evaluation 03/03/23    Authorization Type [employer]    Progress Note Due on Visit 20    SLP Start Time 1400    SLP Stop Time  1440    SLP Time Calculation (min) 40 min    Activity Tolerance Patient tolerated treatment well             Past Medical History:  Diagnosis Date   Asthma    Bronchitis    Cancer (HCC)    lung cancer   Cervical high risk HPV (human papillomavirus) test positive 10/24/2016   COPD (chronic obstructive pulmonary disease) (HCC)    Depression    Dyspnea    Family history of adverse reaction to anesthesia    sister gets PONV   GERD (gastroesophageal reflux disease)    Headache    History of primary non-small cell carcinoma of right lung s/p RUL resection 2004    HPV (human papilloma virus) infection 10/22/2016   Evaluated by GYN [facility]   LGSIL on Pap smear of cervix 10/24/2016   With HPV(+) and mild dysplasia   Lung cancer (HCC) 2004   Lung cancer (HCC)    OA (osteoarthritis) of hip    right   Osteopenia    Personal history of chemotherapy    Urticaria    Past Surgical History:  Procedure Laterality Date   ABDOMINAL HYSTERECTOMY  1997   partial, only ovaries remain; due to heavy bleeding   CHOLECYSTECTOMY  1999   COLONOSCOPY WITH PROPOFOL N/A  05/19/2017   Procedure: COLONOSCOPY WITH PROPOFOL;  Surgeon: Wyline Mood, MD;  Location: [facility] ENDOSCOPY;  Service: Gastroenterology;  Laterality: N/A;   INSERTION OF MESH  12/25/2021   Procedure: INSERTION OF MESH;  Surgeon: Leafy Ro, MD;  Location: [facility] ORS;  Service: General;;   LUNG CANCER SURGERY Right 2004   removed "top portion of right lung"   VENTRAL HERNIA REPAIR N/A 12/25/2021   Procedure: HERNIA REPAIR VENTRAL ADULT, open;  Surgeon: Leafy Ro, MD;  Location: [facility] ORS;  Service: General;  Laterality: N/A;   Patient Active Problem List   Diagnosis Date Noted   Migraine headache 12/31/2021   Incarcerated ventral hernia    SOB (shortness of breath)    Anxiety and depression 05/10/2021   Ganglion cyst of volar aspect of left wrist 08/23/2020   Dyslipidemia 10/24/2016   LGSIL on Pap smear of cervix 10/24/2016   HPV (human papilloma virus) infection 10/22/2016   Preventative health care 05/23/2016   Obesity, Class II, BMI 35-39.9, no comorbidity 05/23/2016   Odynophagia 02/22/2016   Food sticks on swallowing 02/22/2016   Weight gain 01/30/2016   Fatigue 01/30/2016   Snoring 06/15/2015   Calcium oxalate crystals in urine 06/14/2015   Erythrocytosis 06/14/2015   COPD (chronic obstructive pulmonary disease) (HCC) 01/30/2015  Night sweats 01/30/2015   Persistent cough 01/30/2015   Tobacco use disorder 12/09/2014   Vitamin D deficiency 11/18/2014   Major depressive disorder, recurrent episode, moderate with anxious distress (HCC)    Urticaria    OA (osteoarthritis) of hip    Osteopenia 08/08/2014    ONSET DATE: ~4 weeks ago; referral date 12/02/22  REFERRING DIAG: hoarseness  THERAPY DIAG:  Hoarseness  Dysphonia  Dysphonia plicae ventricularis  Rationale for Evaluation and Treatment Rehabilitation  SUBJECTIVE:   SUBJECTIVE STATEMENT: Pt alert, pleasant, and cooperative. Marked improvement in vocal quality.  Pt accompanied by: self  PERTINENT  HISTORY: hx of non-small cell carcinoma of R lung s/p RUL resection in 2004, GERD, COPD, tobacco use disoder, anxiety and depression  DIAGNOSTIC FINDINGS: Laryngoscopy 10/7 "normal mobility, polypoid changes to both cords, using false cords to speak, and severe erythema particularly of the L false cord"  PAIN:  Are you having pain? No   FALLS: Has patient fallen in last 6 months? No  LIVING ENVIRONMENT: Lives with: lives with their spouse Lives in: House/apartment  PLOF: Independent  PATIENT GOALS    for voice to improve  OBJECTIVE:   TODAY'S TREATMENT:   Reviewed progress to date, vocal hygiene, and HEP. Pt endorsed completing HEP indep. Pt denies vocal fatigue or decrease in vocal quality despite increased voice use throughout the day. Pt feels like voice is "much better" and that she is nearing baseline. Very minimal hoarseness and slightly lower than anticipated pitch noted. Pt able to sustain adequate vocal loudness and relatively clear vocal quality during >10 minutes of conversation.          PATIENT EDUCATION: Education details: as above Person educated: Patient Education method: Explanation Education comprehension: verbalized understanding   HOME EXERCISE PROGRAM: 1) modifications to caffeine intake 2) use of clear speech in conversations 3) resonant voice HEP 4) self circumlaryngeal massage 5) semi-occluded voice therapy     GOALS: Goals reviewed with patient? Yes  SHORT TERM GOALS: Target date: 10 sessions     The patient will demonstrate abdominal breathing patterns and steady release of breath on exhalation to optimize efficiency of voicing and decrease laryngeal hyperfunction.   Baseline: Goal status: MET   2.  Patient will ID x3 strategies to improve vocal quality, vocal hygiene, voice projection, and prevent vocal fatigue.    Baseline:  Goal status: MET   3.  The patient will utilize a forward tone focused/resonant voice to decrease vocal  hyperfunction and improve voice quality and vocal projection.   Baseline:  Goal status: MET   4.  The patient will participate in 5-8 minutes conversation, maintaining average loudness of 75 dB and loud, good quality voice with min cues.       Baseline:  Goal status: MET   5.  The patient will decrease laryngeal and articulatory muscle tension by independently completing relaxation/stretching exercises with min cueing.    Baseline:  Goal status: MET  6.  The patient will reduce phonotraumatic behaviors such as chronic throat clearing, by substituting non-traumatic methods to clear mucus.   Baseline:  Goal status: MET  LONG TERM GOALS: Target date: 12 weeks  Patient will report improved communication effectiveness as measured by PROM  Baseline: VHI 50/120 Goal status: INITIAL   2.  The patient will participate in 15-20 minutes conversation, maintaining average loudness of 75 dB and loud, good quality voice.   Baseline:  Goal status: INITIAL   3.  The patient will decrease laryngeal and  articulatory muscle tension by independently completing relaxation/stretching exercises independently.  Baseline:  Goal status: MET      ASSESSMENT:  CLINICAL IMPRESSION: Patient is a 67 y.o. female who was seen today for voice evaluation given dysphonia and hoarseness in setting of "plica ventricularis" (per ENT).  Pt with reports of sudden and persistent hoarseness for about a month. Laryngoscopy 12/02/22, "normal mobility, polypoid changes to both cords, using false cords to speak, and severe erythema particularly of the L false cord." Pt's voice today is c/b strained/hoarse vocal quality with pitch breaks and periods of aphonia.  Pt with tendency to utilize clavicular breathing with phonation/effort. Pt with some phonotraumatic behaviors (e.g. throat clearing, coughing, yelling) as well as tobacco use and moderate caffeine intake. 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 Pt stimulable to both clear speech and resonant voice therapy. Pt is motivated to improve vocal quality. See details of tx session as above. I recommend skilled ST to improve vocal quality, endurance, and vocal hygiene to meet other vocal demands of work and home.   OBJECTIVE IMPAIRMENTS include voice disorder. These impairments are limiting patient from return to work, ADLs/IADLs, and effectively communicating at home and in community. Factors affecting potential to achieve goals and functional outcome are severity of impairments. Patient will benefit from skilled SLP services to address above impairments and improve overall function.  REHAB POTENTIAL: Good  PLAN: SLP FREQUENCY: 1-2x/week  SLP DURATION: 12 weeks  PLANNED INTERVENTIONS: Functional tasks, SLP instruction and feedback, Compensatory strategies, and Patient/family education    Clyde Canterbury, M.S., CCC-SLP Speech-Language Pathologist [place] [facility] [phone] Arnette Felts)  [facility] [address] [place], [place], [address] Phone: [phone]   Fax:  [phone]

## 2023-01-22 NOTE — Anesthesia Preprocedure Evaluation (Addendum)
Anesthesia Evaluation  Patient identified by MRN, date of birth, ID band Patient awake    Reviewed: Allergy & Precautions, H&P , NPO status , Patient's Chart, lab work & pertinent test results  History of Anesthesia Complications (+) Family history of anesthesia reaction  Airway Mallampati: II  TM Distance: >3 FB Neck ROM: Full    Dental no notable dental hx.  Crowns and permanent partial upper, is not removable:   Pulmonary neg pulmonary ROS, shortness of breath, asthma , COPD, Current Smoker and Patient abstained from smoking.  Duoneb preop, for mild inspiratory wheezing.    + wheezing      Cardiovascular negative cardio ROS Normal cardiovascular exam Rhythm:Regular Rate:Normal     Neuro/Psych  Headaches PSYCHIATRIC DISORDERS Anxiety Depression    negative neurological ROS  negative psych ROS   GI/Hepatic negative GI ROS, Neg liver ROS,GERD  ,,  Endo/Other  negative endocrine ROS    Renal/GU negative Renal ROS  negative genitourinary   Musculoskeletal negative musculoskeletal ROS (+) Arthritis ,    Abdominal   Peds negative pediatric ROS (+)  Hematology negative hematology ROS (+)   Anesthesia Other Findings   Asthma  Osteopenia Depression  COPD (chronic obstructive pulmonary disease) (HCC) Urticaria  OA (osteoarthritis) of hip Bronchitis  Personal history of chemotherapy HPV (human papilloma virus) infection LGSIL on Pap smear of cervix Cervical high risk HPV (human papillomavirus) test positive  Lung cancer History of primary non-small cell carcinoma of right lung s/p RUL resection 2004  Headache Dyspnea  GERD (gastroesophageal reflux disease) Family history of adverse reaction to anesthesia--sister PONV      Reproductive/Obstetrics negative OB ROS                              Anesthesia Physical Anesthesia Plan  ASA: 3  Anesthesia Plan: General ETT   Post-op Pain  Management:    Induction: Intravenous  PONV Risk Score and Plan:   Airway Management Planned: Oral ETT  Additional Equipment:   Intra-op Plan:   Post-operative Plan: Extubation in OR  Informed Consent: I have reviewed the patients History and Physical, chart, labs and discussed the procedure including the risks, benefits and alternatives for the proposed anesthesia with the patient or authorized representative who has indicated his/her understanding and acceptance.     Dental Advisory Given  Plan Discussed with: Anesthesiologist, CRNA and Surgeon  Anesthesia Plan Comments: (Patient consented for risks of anesthesia including but not limited to:  - adverse reactions to medications - damage to eyes, teeth, lips or other oral mucosa - nerve damage due to positioning  - sore throat or hoarseness - Damage to heart, brain, nerves, lungs, other parts of body or loss of life  Patient voiced understanding and assent.)         Anesthesia Quick Evaluation

## 2023-01-28 ENCOUNTER — Ambulatory Visit: Payer: Medicare HMO

## 2023-01-30 ENCOUNTER — Ambulatory Visit: Payer: Medicare HMO

## 2023-01-30 ENCOUNTER — Telehealth: Payer: Self-pay

## 2023-01-30 NOTE — Telephone Encounter (Signed)
Returned pt's phone call from 12/4. Pt stated vocal cord biopsy has been rescheduled for 12/13. Pt has canceled ST visits pending completion of biopsy. Marked improvement in pt's vocal quality noted, pt stated she feels like her voice is "back to normal," but would like to see if it affected by biopsy.  Clyde Canterbury, M.S., CCC-SLP Speech-Language Pathologist [place] [facility] [phone] (ASCOM)

## 2023-02-04 ENCOUNTER — Ambulatory Visit: Payer: Medicare HMO

## 2023-02-06 ENCOUNTER — Ambulatory Visit: Payer: Medicare HMO

## 2023-02-07 ENCOUNTER — Ambulatory Visit: Payer: Medicare HMO | Admitting: Anesthesiology

## 2023-02-07 ENCOUNTER — Encounter: Payer: Self-pay | Admitting: Unknown Physician Specialty

## 2023-02-07 ENCOUNTER — Encounter
Admission: RE | Disposition: A | Discharge status: Home / Self Care | Payer: Self-pay | Source: Ambulatory Visit | Attending: Unknown Physician Specialty

## 2023-02-07 ENCOUNTER — Other Ambulatory Visit: Payer: Self-pay

## 2023-02-07 ENCOUNTER — Ambulatory Visit
Admission: RE | Admit: 2023-02-07 | Discharge: 2023-02-07 | Disposition: A | Discharge status: Home / Self Care | Payer: Medicare HMO | Source: Ambulatory Visit | Attending: Unknown Physician Specialty | Admitting: Unknown Physician Specialty

## 2023-02-07 DIAGNOSIS — Z85118 Personal history of other malignant neoplasm of bronchus and lung: Secondary | ICD-10-CM | POA: Insufficient documentation

## 2023-02-07 DIAGNOSIS — J4489 Other specified chronic obstructive pulmonary disease: Secondary | ICD-10-CM | POA: Insufficient documentation

## 2023-02-07 DIAGNOSIS — R490 Dysphonia: Secondary | ICD-10-CM | POA: Diagnosis not present

## 2023-02-07 DIAGNOSIS — F17210 Nicotine dependence, cigarettes, uncomplicated: Secondary | ICD-10-CM | POA: Diagnosis not present

## 2023-02-07 DIAGNOSIS — K219 Gastro-esophageal reflux disease without esophagitis: Secondary | ICD-10-CM | POA: Diagnosis not present

## 2023-02-07 DIAGNOSIS — F17200 Nicotine dependence, unspecified, uncomplicated: Secondary | ICD-10-CM | POA: Diagnosis not present

## 2023-02-07 DIAGNOSIS — J449 Chronic obstructive pulmonary disease, unspecified: Secondary | ICD-10-CM | POA: Diagnosis not present

## 2023-02-07 DIAGNOSIS — Z9221 Personal history of antineoplastic chemotherapy: Secondary | ICD-10-CM | POA: Diagnosis not present

## 2023-02-07 DIAGNOSIS — J383 Other diseases of vocal cords: Secondary | ICD-10-CM | POA: Diagnosis not present

## 2023-02-07 HISTORY — DX: Dyspnea, unspecified: R06.00

## 2023-02-07 HISTORY — PX: MICROLARYNGOSCOPY: SHX5208

## 2023-02-07 HISTORY — DX: Headache, unspecified: R51.9

## 2023-02-07 HISTORY — DX: Gastro-esophageal reflux disease without esophagitis: K21.9

## 2023-02-07 HISTORY — DX: Family history of other specified conditions: Z84.89

## 2023-02-07 SURGERY — MICROLARYNGOSCOPY
Anesthesia: General | Site: Throat | Laterality: Bilateral

## 2023-02-07 MED ORDER — PROPOFOL 10 MG/ML IV BOLUS
INTRAVENOUS | Status: AC
  Filled 2023-02-07: qty 20

## 2023-02-07 MED ORDER — ONDANSETRON HCL 4 MG/2ML IJ SOLN
INTRAMUSCULAR | Status: AC
  Filled 2023-02-07: qty 2

## 2023-02-07 MED ORDER — IPRATROPIUM-ALBUTEROL 0.5-2.5 (3) MG/3ML IN SOLN
3.0000 mL | Freq: Once | RESPIRATORY_TRACT | Status: AC
  Administered 2023-02-07: 3 mL via RESPIRATORY_TRACT

## 2023-02-07 MED ORDER — MIDAZOLAM HCL 5 MG/5ML IJ SOLN
INTRAMUSCULAR | Status: DC | PRN
  Administered 2023-02-07: 2 mg via INTRAVENOUS

## 2023-02-07 MED ORDER — MIDAZOLAM HCL 2 MG/2ML IJ SOLN
INTRAMUSCULAR | Status: AC
  Filled 2023-02-07: qty 2

## 2023-02-07 MED ORDER — DEXAMETHASONE SODIUM PHOSPHATE 4 MG/ML IJ SOLN
INTRAMUSCULAR | Status: DC | PRN
  Administered 2023-02-07: 8 mg via INTRAVENOUS

## 2023-02-07 MED ORDER — SUCCINYLCHOLINE CHLORIDE 200 MG/10ML IV SOSY
PREFILLED_SYRINGE | INTRAVENOUS | Status: AC
  Filled 2023-02-07: qty 10

## 2023-02-07 MED ORDER — FENTANYL CITRATE (PF) 100 MCG/2ML IJ SOLN
INTRAMUSCULAR | Status: AC
  Filled 2023-02-07: qty 2

## 2023-02-07 MED ORDER — IPRATROPIUM-ALBUTEROL 0.5-2.5 (3) MG/3ML IN SOLN
RESPIRATORY_TRACT | Status: AC
  Filled 2023-02-07: qty 3

## 2023-02-07 MED ORDER — ONDANSETRON HCL 4 MG/2ML IJ SOLN
INTRAMUSCULAR | Status: DC | PRN
  Administered 2023-02-07: 4 mg via INTRAVENOUS

## 2023-02-07 MED ORDER — PHENYLEPHRINE HCL 0.5 % NA SOLN
NASAL | Status: DC | PRN
  Administered 2023-02-07: 15 mL via TOPICAL

## 2023-02-07 MED ORDER — FENTANYL CITRATE (PF) 100 MCG/2ML IJ SOLN
INTRAMUSCULAR | Status: DC | PRN
  Administered 2023-02-07 (×2): 50 ug via INTRAVENOUS

## 2023-02-07 MED ORDER — SUCCINYLCHOLINE CHLORIDE 200 MG/10ML IV SOSY
PREFILLED_SYRINGE | INTRAVENOUS | Status: DC | PRN
  Administered 2023-02-07: 100 mg via INTRAVENOUS

## 2023-02-07 MED ORDER — LIDOCAINE HCL (PF) 2 % IJ SOLN
INTRAMUSCULAR | Status: AC
  Filled 2023-02-07: qty 5

## 2023-02-07 MED ORDER — LACTATED RINGERS IV SOLN: INTRAVENOUS | Status: DC

## 2023-02-07 MED ORDER — PROPOFOL 10 MG/ML IV BOLUS
INTRAVENOUS | Status: DC | PRN
  Administered 2023-02-07: 170 mg via INTRAVENOUS

## 2023-02-07 MED ORDER — LIDOCAINE HCL (CARDIAC) PF 100 MG/5ML IV SOSY
PREFILLED_SYRINGE | INTRAVENOUS | Status: DC | PRN
  Administered 2023-02-07: 50 mg via INTRAVENOUS

## 2023-02-07 SURGICAL SUPPLY — 22 items
ANTIFOG SOL W/FOAM PAD STRL (MISCELLANEOUS) ×1
BASIN GRAD PLASTIC 32OZ STRL (MISCELLANEOUS) ×1 IMPLANT
BLOCK BITE GUARD (MISCELLANEOUS) ×1 IMPLANT
CANISTER SUCT 1200ML W/VALVE (MISCELLANEOUS) ×1 IMPLANT
COVER MAYO STAND STRL (DRAPES) ×1 IMPLANT
COVER TABLE BACK 60X90 (DRAPES) ×1 IMPLANT
CUP MEDICINE 2OZ PLAST GRAD ST (MISCELLANEOUS) ×1 IMPLANT
DRAPE SHEET LG 3/4 BI-LAMINATE (DRAPES) ×1 IMPLANT
DRSG TELFA 4X3 1S NADH ST (GAUZE/BANDAGES/DRESSINGS) ×1 IMPLANT
GLOVE BIO SURGEON STRL SZ7.5 (GLOVE) ×1 IMPLANT
KIT TURNOVER KIT A (KITS) ×1 IMPLANT
MARKER SKIN DUAL TIP RULER LAB (MISCELLANEOUS) ×1 IMPLANT
NDL FILTER BLUNT 18X1 1/2 (NEEDLE) ×1 IMPLANT
NEEDLE FILTER BLUNT 18X1 1/2 (NEEDLE) ×1 IMPLANT
NS IRRIG 500ML POUR BTL (IV SOLUTION) ×1 IMPLANT
PATTIES SURGICAL .5 X.5 (GAUZE/BANDAGES/DRESSINGS) ×1 IMPLANT
SOLUTION ANTFG W/FOAM PAD STRL (MISCELLANEOUS) ×1 IMPLANT
SPONGE XRAY 4X4 16PLY STRL (MISCELLANEOUS) ×1 IMPLANT
STRAP BODY AND KNEE 60X3 (MISCELLANEOUS) ×1 IMPLANT
SYR 10ML LL (SYRINGE) ×1 IMPLANT
TOWEL OR 17X26 4PK STRL BLUE (TOWEL DISPOSABLE) ×1 IMPLANT
TUBING SUCTION CONN 0.25 STRL (TUBING) ×1 IMPLANT

## 2023-02-07 NOTE — Op Note (Signed)
02/07/2023  11:42 AM    Debra Mccann  [id number]   Pre-Op Dx: Dysphonia, hoarseness  Post-op Dx: SAME  Proc: MicroDirect laryngoscopy  Surg:  Davina Poke  Anes:  GOT  EBL: 0  Comp: None  Findings: Normal vocal folds previous leukoplakia had resolved  Procedure: [name] was seen and found holding area taken the operating placed in supine position.  After general trach anesthesia the table was turned 45 degrees.  The patient was draped in usual fashion for laryngoscopy.  A tooth guard was placed.  A Dedo laryngoscope was introduced into the airway and suspended.  A 0 degree Hopkins rod was brought in the field examination larynx showed complete resolution of the bilateral leukoplakia which had been present on prior exam.  The larynx was thoroughly examined there was no evidence of lesion appeared to be normal vocal cord mucosa.  Therefore I did not feel like any biopsies were warranted.  The patient was then returned to anesthesia where she was awakened in the operating type cart in stable condition.  Dispo:   Good  Plan: Discharge to home follow-up 6 weeks  Davina Poke  02/07/2023 11:42 AM

## 2023-02-07 NOTE — Transfer of Care (Signed)
Immediate Anesthesia Transfer of Care Note  Patient: Debra Mccann  Procedure(s) Performed: Lequita Asal LARYNGOSCOPY WITN BIOPSY OF VOCAL CORDS (Bilateral: Throat)  Patient Location: PACU  Anesthesia Type: General ETT  Level of Consciousness: awake, alert  and patient cooperative  Airway and Oxygen Therapy: Patient Spontanous Breathing and Patient connected to supplemental oxygen  Post-op Assessment: Post-op Vital signs reviewed, Patient's Cardiovascular Status Stable, Respiratory Function Stable, Patent Airway and No signs of Nausea or vomiting  Post-op Vital Signs: Reviewed and stable  Complications: No notable events documented.

## 2023-02-07 NOTE — Anesthesia Procedure Notes (Signed)
Procedure Name: Intubation Date/Time: 02/07/2023 11:32 AM  Performed by: Domenic Moras, CRNAPre-anesthesia Checklist: Patient identified, Emergency Drugs available, Suction available and Patient being monitored Patient Re-evaluated:Patient Re-evaluated prior to induction Oxygen Delivery Method: Circle system utilized Preoxygenation: Pre-oxygenation with 100% oxygen Induction Type: IV induction Ventilation: Mask ventilation without difficulty Laryngoscope Size: McGrath and 3 Grade View: Grade I Tube type: Oral Tube size: 6.0 mm Number of attempts: 1 Airway Equipment and Method: Stylet and Oral airway Placement Confirmation: ETT inserted through vocal cords under direct vision, positive ETCO2 and breath sounds checked- equal and bilateral Secured at: 20 cm Tube secured with: Tape Dental Injury: Teeth and Oropharynx as per pre-operative assessment

## 2023-02-07 NOTE — H&P (Signed)
The patient's history has been reviewed, patient examined, no change in status, stable for surgery.  Questions were answered to the patients satisfaction.  

## 2023-02-10 ENCOUNTER — Encounter: Payer: Self-pay | Admitting: Unknown Physician Specialty

## 2023-02-10 ENCOUNTER — Telehealth: Payer: Self-pay

## 2023-02-10 NOTE — Telephone Encounter (Signed)
This Clinical research associate spoke to pt via telephone. Per pt report, pt did not require biopsy on 02/07/23 and reports "spots are gone." Pt with clear voice without s/sx dysphonia. Pt wishing to cancel further SLP appointments. Pt d/c'd from SLP services given the above.

## 2023-02-11 ENCOUNTER — Ambulatory Visit: Payer: Medicare HMO

## 2023-02-12 NOTE — Anesthesia Postprocedure Evaluation (Signed)
Anesthesia Post Note  Patient: Debra Mccann  Procedure(s) Performed: Lequita Asal LARYNGOSCOPY WITN BIOPSY OF VOCAL CORDS (Bilateral: Throat)  Patient location during evaluation: PACU Anesthesia Type: General Level of consciousness: awake and alert Pain management: pain level controlled Vital Signs Assessment: post-procedure vital signs reviewed and stable Respiratory status: spontaneous breathing, nonlabored ventilation, respiratory function stable and patient connected to nasal cannula oxygen Cardiovascular status: blood pressure returned to baseline and stable Postop Assessment: no apparent nausea or vomiting Anesthetic complications: no   No notable events documented.   Last Vitals:  Vitals:   02/07/23 1200 02/07/23 1217  BP: 133/62 124/62  Pulse: 80 76  Resp: 16 19  Temp:  36.6 C  SpO2: 97% 92%    Last Pain:  Vitals:   02/10/23 1035  TempSrc:   PainSc: 0-No pain                 Sherby Moncayo C Knox Cervi

## 2023-02-13 ENCOUNTER — Ambulatory Visit: Payer: Medicare HMO

## 2023-02-20 ENCOUNTER — Ambulatory Visit: Payer: Medicare HMO

## 2023-03-20 DIAGNOSIS — R490 Dysphonia: Secondary | ICD-10-CM | POA: Diagnosis not present

## 2023-03-24 ENCOUNTER — Other Ambulatory Visit: Payer: Self-pay | Admitting: Family

## 2023-04-14 ENCOUNTER — Other Ambulatory Visit: Payer: Self-pay | Admitting: Cardiology

## 2023-05-12 DIAGNOSIS — J069 Acute upper respiratory infection, unspecified: Secondary | ICD-10-CM | POA: Diagnosis not present

## 2023-05-12 DIAGNOSIS — Z8709 Personal history of other diseases of the respiratory system: Secondary | ICD-10-CM | POA: Diagnosis not present

## 2023-05-12 DIAGNOSIS — Z03818 Encounter for observation for suspected exposure to other biological agents ruled out: Secondary | ICD-10-CM | POA: Diagnosis not present

## 2023-05-20 ENCOUNTER — Encounter: Payer: Self-pay | Admitting: Cardiology

## 2023-05-20 ENCOUNTER — Ambulatory Visit (INDEPENDENT_AMBULATORY_CARE_PROVIDER_SITE_OTHER): Payer: Medicare HMO | Admitting: Cardiology

## 2023-05-20 ENCOUNTER — Other Ambulatory Visit: Payer: Self-pay | Admitting: Cardiology

## 2023-05-20 VITALS — BP 124/62 | HR 80 | Ht 65.0 in | Wt 177.0 lb

## 2023-05-20 DIAGNOSIS — E559 Vitamin D deficiency, unspecified: Secondary | ICD-10-CM | POA: Diagnosis not present

## 2023-05-20 DIAGNOSIS — R7303 Prediabetes: Secondary | ICD-10-CM

## 2023-05-20 DIAGNOSIS — E538 Deficiency of other specified B group vitamins: Secondary | ICD-10-CM

## 2023-05-20 DIAGNOSIS — Z1329 Encounter for screening for other suspected endocrine disorder: Secondary | ICD-10-CM

## 2023-05-20 DIAGNOSIS — E785 Hyperlipidemia, unspecified: Secondary | ICD-10-CM | POA: Diagnosis not present

## 2023-05-20 DIAGNOSIS — Z0130 Encounter for examination of blood pressure without abnormal findings: Secondary | ICD-10-CM

## 2023-05-20 DIAGNOSIS — L918 Other hypertrophic disorders of the skin: Secondary | ICD-10-CM

## 2023-05-20 DIAGNOSIS — E782 Mixed hyperlipidemia: Secondary | ICD-10-CM | POA: Diagnosis not present

## 2023-05-20 HISTORY — DX: Prediabetes: R73.03

## 2023-05-20 LAB — HEMOGLOBIN A1C
Est. average glucose Bld gHb Est-mCnc: 111 mg/dL
Hgb A1c MFr Bld: 5.5 % (ref 4.8–5.6)

## 2023-05-20 MED ORDER — SUMATRIPTAN SUCCINATE 25 MG PO TABS: 50.0000 mg | ORAL_TABLET | Freq: Once | ORAL | 2 refills | Status: DC | PRN

## 2023-05-20 NOTE — Progress Notes (Unsigned)
 Established Patient Office Visit  Subjective:  Patient ID: Debra Mccann, female    DOB: 1955-03-08  Age: 68 y.o. MRN: [id number]  Chief Complaint  Patient presents with   Follow-up    4 Months Lab Results    Patient in office for 4 month follow up, did not have labs done. Will get labs today.  Patient complaining of extreme fatigue. 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 Will check iron today.  Patient complaining of frequent headaches, will send in Imitrex.  Patient complaining of skin changes near her elbow, skin tag. Will refer to dermatology.  Discuss lung cancer screening at next visit.     No other concerns at this time.   Past Medical History:  Diagnosis Date   Asthma    Bronchitis    Cancer (HCC)    lung cancer   Cervical high risk HPV (human papillomavirus) test positive 10/24/2016   COPD (chronic obstructive pulmonary disease) (HCC)    Depression    Dyspnea    Family history of adverse reaction to anesthesia    sister gets PONV   GERD (gastroesophageal reflux disease)    Headache    History of primary non-small cell carcinoma of right lung s/p RUL resection 2004    HPV (human papilloma virus) infection 10/22/2016   Evaluated by GYN [facility]   LGSIL on Pap smear of cervix 10/24/2016   With HPV(+) and mild dysplasia   Lung cancer (HCC) 2004   Lung cancer (HCC)    OA (osteoarthritis) of hip    right   Osteopenia    Personal history of chemotherapy    Urticaria     Past Surgical History:  Procedure Laterality Date   ABDOMINAL HYSTERECTOMY  1997   partial, only ovaries remain; due to heavy bleeding   CHOLECYSTECTOMY  1999   COLONOSCOPY WITH PROPOFOL N/A 05/19/2017   Procedure: COLONOSCOPY WITH PROPOFOL;  Surgeon: Wyline Mood, MD;  Location: [facility] ENDOSCOPY;  Service: Gastroenterology;  Laterality: N/A;   INSERTION OF MESH  12/25/2021   Procedure: INSERTION OF MESH;  Surgeon: Leafy Ro, MD;  Location: [facility] ORS;   Service: General;;   LUNG CANCER SURGERY Right 2004   removed "top portion of right lung"   MICROLARYNGOSCOPY Bilateral 02/07/2023   Procedure: MICRODIRECT LARYNGOSCOPY WITN BIOPSY OF VOCAL CORDS;  Surgeon: Linus Salmons, MD;  Location: [facility];  Service: ENT;  Laterality: Bilateral;   VENTRAL HERNIA REPAIR N/A 12/25/2021   Procedure: HERNIA REPAIR VENTRAL ADULT, open;  Surgeon: Leafy Ro, MD;  Location: [facility] ORS;  Service: General;  Laterality: N/A;    Social History   Socioeconomic History   Marital status: Married    Spouse name: Not on file   Number of children: Not on file   Years of education: Not on file   Highest education level: Not on file  Occupational History   Not on file  Tobacco Use   Smoking status: Every Day    Current packs/day: 0.50    Average packs/day: 0.5 packs/day for 23.0 years (11.5 ttl pk-yrs)    Types: Cigarettes   Smokeless tobacco: Never  Vaping Use   Vaping status: Never Used  Substance and Sexual Activity   Alcohol use: No    Alcohol/week: 0.0 standard drinks of alcohol   Drug use: No   Sexual activity: Yes  Other Topics Concern   Not on file  Social History Narrative   Not on  file   Social Drivers of Health   Financial Resource Strain: Not on file  Food Insecurity: No Food Insecurity (01/01/2022)   Hunger Vital Sign    Worried About Running Out of Food in the Last Year: Never true    Ran Out of Food in the Last Year: Never true  Transportation Needs: No Transportation Needs (01/01/2022)   PRAPARE - Administrator, Civil Service (Medical): No    Lack of Transportation (Non-Medical): No  Physical Activity: Not on file  Stress: Not on file  Social Connections: Not on file  Intimate Partner Violence: Not At Risk (01/01/2022)   Humiliation, Afraid, Rape, and Kick questionnaire    Fear of Current or Ex-Partner: No    Emotionally Abused: No    Physically Abused: No    Sexually Abused: No    Family History   Problem Relation Age of Onset   CAD Mother        with stents   Heart disease Mother    Hypertension Mother    Heart disease Father        3 vessel CABG   Cancer Father        melanoma   Hypertension Father    Gout Father    Depression Daughter    Alzheimer's disease Maternal Grandmother    Breast cancer Cousin    Diabetes Neg Hx    Stroke Neg Hx    COPD Neg Hx     Allergies  Allergen Reactions   Penicillins Hives    Outpatient Medications Prior to Visit  Medication Sig   albuterol (VENTOLIN HFA) 108 (90 Base) MCG/ACT inhaler Inhale 2 puffs into the lungs every 6 (six) hours as needed.   BREO ELLIPTA 200-25 MCG/ACT AEPB INHALE 1 PUFF INTO THE LUNGS DAILY.   cetirizine (ZYRTEC) 10 MG tablet Take 10 mg by mouth daily.   cholecalciferol (VITAMIN D3) 10 MCG (400 UNIT) TABS tablet Take 400 Units by mouth daily.   citalopram (CELEXA) 20 MG tablet Take 1 tablet (20 mg total) by mouth daily.   hydrOXYzine (VISTARIL) 25 MG capsule Take 25 mg by mouth every 8 (eight) hours as needed.   ipratropium-albuterol (DUONEB) 0.5-2.5 (3) MG/3ML SOLN Inhale 3 mLs into the lungs every 6 (six) hours as needed.   omeprazole (PRILOSEC) 40 MG capsule TAKE 1 CAPSULE (40 MG TOTAL) BY MOUTH DAILY.   rosuvastatin (CRESTOR) 10 MG tablet Take 1 tablet (10 mg total) by mouth at bedtime.   SYMBICORT 80-4.5 MCG/ACT inhaler INHALE 2 PUFFS INTO THE LUNGS IN THE MORNING AND AT BEDTIME.   vitamin B-12 (CYANOCOBALAMIN) 100 MCG tablet Take 100 mcg by mouth daily.   No facility-administered medications prior to visit.    Review of Systems  Constitutional:  Positive for malaise/fatigue.  HENT: Negative.    Eyes: Negative.   Respiratory: Negative.  Negative for shortness of breath.   Cardiovascular: Negative.  Negative for chest pain.  Gastrointestinal: Negative.  Negative for abdominal pain, constipation and diarrhea.  Genitourinary: Negative.   Musculoskeletal:  Negative for joint pain and myalgias.  Skin:  Negative.   Neurological: Negative.  Negative for dizziness and headaches.  Endo/Heme/Allergies: Negative.   All other systems reviewed and are negative.      Objective:   BP 124/62   Pulse 80   Ht 5\' 5"  (1.651 m)   Wt 177 lb (80.3 kg)   SpO2 95%   BMI 29.45 kg/m   Vitals:   05/20/23 1510  BP: 124/62  Pulse: 80  Height: 5\' 5"  (1.651 m)  Weight: 177 lb (80.3 kg)  SpO2: 95%  BMI (Calculated): 29.45    Physical Exam Vitals and nursing note reviewed.  Constitutional:      Appearance: Normal appearance. She is normal weight.  HENT:     Head: Normocephalic and atraumatic.     Nose: Nose normal.     Mouth/Throat:     Mouth: Mucous membranes are moist.  Eyes:     Extraocular Movements: Extraocular movements intact.     Conjunctiva/sclera: Conjunctivae normal.     Pupils: Pupils are equal, round, and reactive to light.  Cardiovascular:     Rate and Rhythm: Normal rate and regular rhythm.     Pulses: Normal pulses.     Heart sounds: Normal heart sounds.  Pulmonary:     Effort: Pulmonary effort is normal.     Breath sounds: Normal breath sounds.  Abdominal:     General: Abdomen is flat. Bowel sounds are normal.     Palpations: Abdomen is soft.  Musculoskeletal:        General: Normal range of motion.     Cervical back: Normal range of motion.  Skin:    General: Skin is warm and dry.  Neurological:     General: No focal deficit present.     Mental Status: She is alert and oriented to person, place, and time.  Psychiatric:        Mood and Affect: Mood normal.        Behavior: Behavior normal.        Thought Content: Thought content normal.        Judgment: Judgment normal.      Results for orders placed or performed in visit on 05/20/23  Iron, TIBC and Ferritin Panel  Result Value Ref Range   Total Iron Binding Capacity 360 250 - 450 ug/dL   UIBC 161 096 - 045 ug/dL   Iron 65 27 - 409 ug/dL   Iron Saturation 18 15 - 55 %   Ferritin 34 15 - 150 ng/mL  CBC  with Differential/Platelet  Result Value Ref Range   WBC 8.7 3.4 - 10.8 x10E3/uL   RBC 4.99 3.77 - 5.28 x10E6/uL   Hemoglobin 13.9 11.1 - 15.9 g/dL   Hematocrit 81.1 91.4 - 46.6 %   MCV 88 79 - 97 fL   MCH 27.9 26.6 - 33.0 pg   MCHC 31.7 31.5 - 35.7 g/dL   RDW 78.2 95.6 - 21.3 %   Platelets 336 150 - 450 x10E3/uL   Neutrophils 57 Not Estab. %   Lymphs 30 Not Estab. %   Monocytes 7 Not Estab. %   Eos 5 Not Estab. %   Basos 1 Not Estab. %   Neutrophils Absolute 5.0 1.4 - 7.0 x10E3/uL   Lymphocytes Absolute 2.6 0.7 - 3.1 x10E3/uL   Monocytes Absolute 0.6 0.1 - 0.9 x10E3/uL   EOS (ABSOLUTE) 0.4 0.0 - 0.4 x10E3/uL   Basophils Absolute 0.1 0.0 - 0.2 x10E3/uL   Immature Granulocytes 0 Not Estab. %   Immature Grans (Abs) 0.0 0.0 - 0.1 x10E3/uL  Vitamin B12  Result Value Ref Range   Vitamin B-12 1,458 (H) 232 - 1,245 pg/mL  Vitamin D (25 hydroxy)  Result Value Ref Range   Vit D, 25-Hydroxy 77.5 30.0 - 100.0 ng/mL  TSH  Result Value Ref Range   TSH 1.260 0.450 - 4.500 uIU/mL  CMP14+EGFR  Result Value Ref Range  Glucose 86 70 - 99 mg/dL   BUN 12 8 - 27 mg/dL   Creatinine, Ser 2.95 0.57 - 1.00 mg/dL   eGFR 79 >62 ZH/YQM/5.78   BUN/Creatinine Ratio 15 12 - 28   Sodium 139 134 - 144 mmol/L   Potassium 4.4 3.5 - 5.2 mmol/L   Chloride 99 96 - 106 mmol/L   CO2 27 20 - 29 mmol/L   Calcium 9.4 8.7 - 10.3 mg/dL   Total Protein 6.1 6.0 - 8.5 g/dL   Albumin 3.9 3.9 - 4.9 g/dL   Globulin, Total 2.2 1.5 - 4.5 g/dL   Bilirubin Total 0.3 0.0 - 1.2 mg/dL   Alkaline Phosphatase 61 44 - 121 IU/L   AST 15 0 - 40 IU/L   ALT 10 0 - 32 IU/L  Lipid Profile  Result Value Ref Range   Cholesterol, Total 137 100 - 199 mg/dL   Triglycerides 469 0 - 149 mg/dL   HDL 57 >62 mg/dL   VLDL Cholesterol Cal 22 5 - 40 mg/dL   LDL Chol Calc (NIH) 58 0 - 99 mg/dL   Chol/HDL Ratio 2.4 0.0 - 4.4 ratio  Hemoglobin A1c  Result Value Ref Range   Hgb A1c MFr Bld 5.5 4.8 - 5.6 %   Est. average glucose Bld gHb  Est-mCnc 111 mg/dL    Recent Results (from the past 2160 hours)  Iron, TIBC and Ferritin Panel     Status: None   Collection Time: 05/20/23  3:31 PM  Result Value Ref Range   Total Iron Binding Capacity 360 250 - 450 ug/dL   UIBC 952 841 - 324 ug/dL   Iron 65 27 - 401 ug/dL   Iron Saturation 18 15 - 55 %   Ferritin 34 15 - 150 ng/mL  CBC with Differential/Platelet     Status: None   Collection Time: 05/20/23  3:31 PM  Result Value Ref Range   WBC 8.7 3.4 - 10.8 x10E3/uL   RBC 4.99 3.77 - 5.28 x10E6/uL   Hemoglobin 13.9 11.1 - 15.9 g/dL   Hematocrit 02.7 25.3 - 46.6 %   MCV 88 79 - 97 fL   MCH 27.9 26.6 - 33.0 pg   MCHC 31.7 31.5 - 35.7 g/dL   RDW 66.4 40.3 - 47.4 %   Platelets 336 150 - 450 x10E3/uL   Neutrophils 57 Not Estab. %   Lymphs 30 Not Estab. %   Monocytes 7 Not Estab. %   Eos 5 Not Estab. %   Basos 1 Not Estab. %   Neutrophils Absolute 5.0 1.4 - 7.0 x10E3/uL   Lymphocytes Absolute 2.6 0.7 - 3.1 x10E3/uL   Monocytes Absolute 0.6 0.1 - 0.9 x10E3/uL   EOS (ABSOLUTE) 0.4 0.0 - 0.4 x10E3/uL   Basophils Absolute 0.1 0.0 - 0.2 x10E3/uL   Immature Granulocytes 0 Not Estab. %   Immature Grans (Abs) 0.0 0.0 - 0.1 x10E3/uL  Vitamin B12     Status: Abnormal   Collection Time: 05/20/23  3:31 PM  Result Value Ref Range   Vitamin B-12 1,458 (H) 232 - 1,245 pg/mL  Vitamin D (25 hydroxy)     Status: None   Collection Time: 05/20/23  3:31 PM  Result Value Ref Range   Vit D, 25-Hydroxy 77.5 30.0 - 100.0 ng/mL    Comment: Vitamin D deficiency has been defined by the Institute of Medicine and an Endocrine Society practice guideline as a level of serum 25-OH vitamin D less than 20 ng/mL (1,2).  The Endocrine Society went on to further define vitamin D insufficiency as a level between 21 and 29 ng/mL (2). 1. IOM (Institute of Medicine). 2010. Dietary reference    intakes for calcium and D. [place]: The    [employer]. 2. Holick MF, Binkley [place], Bischoff-Ferrari  HA, et al.    Evaluation, treatment, and prevention of vitamin D    deficiency: an Endocrine Society clinical practice    guideline. JCEM. 2011 Jul; 96(7):1911-30.   TSH     Status: None   Collection Time: 05/20/23  3:31 PM  Result Value Ref Range   TSH 1.260 0.450 - 4.500 uIU/mL  CMP14+EGFR     Status: None   Collection Time: 05/20/23  3:31 PM  Result Value Ref Range   Glucose 86 70 - 99 mg/dL   BUN 12 8 - 27 mg/dL   Creatinine, Ser 1.61 0.57 - 1.00 mg/dL   eGFR 79 >09 UE/AVW/0.98   BUN/Creatinine Ratio 15 12 - 28   Sodium 139 134 - 144 mmol/L   Potassium 4.4 3.5 - 5.2 mmol/L   Chloride 99 96 - 106 mmol/L   CO2 27 20 - 29 mmol/L   Calcium 9.4 8.7 - 10.3 mg/dL   Total Protein 6.1 6.0 - 8.5 g/dL   Albumin 3.9 3.9 - 4.9 g/dL   Globulin, Total 2.2 1.5 - 4.5 g/dL   Bilirubin Total 0.3 0.0 - 1.2 mg/dL   Alkaline Phosphatase 61 44 - 121 IU/L   AST 15 0 - 40 IU/L   ALT 10 0 - 32 IU/L  Lipid Profile     Status: None   Collection Time: 05/20/23  3:31 PM  Result Value Ref Range   Cholesterol, Total 137 100 - 199 mg/dL   Triglycerides 119 0 - 149 mg/dL   HDL 57 >14 mg/dL   VLDL Cholesterol Cal 22 5 - 40 mg/dL   LDL Chol Calc (NIH) 58 0 - 99 mg/dL   Chol/HDL Ratio 2.4 0.0 - 4.4 ratio    Comment:                                   T. Chol/HDL Ratio                                             Men  Women                               1/2 Avg.Risk  3.4    3.3                                   Avg.Risk  5.0    4.4                                2X Avg.Risk  9.6    7.1                                3X Avg.Risk 23.4   11.0   Hemoglobin A1c     Status: None  Collection Time: 05/20/23  3:32 PM  Result Value Ref Range   Hgb A1c MFr Bld 5.5 4.8 - 5.6 %    Comment:          Prediabetes: 5.7 - 6.4          Diabetes: >6.4          Glycemic control for adults with diabetes: <7.0    Est. average glucose Bld gHb Est-mCnc 111 mg/dL      Assessment & Plan:  Lab work today.  Imitrex for  headaches. Referral to dermatology sent.  Discuss lung cancer screening at next visit.   Problem List Items Addressed This Visit       Other   Vitamin D deficiency   Relevant Orders   Vitamin D (25 hydroxy) (Completed)   Dyslipidemia - Primary   Relevant Orders   Lipid Profile (Completed)   Prediabetes   Relevant Orders   CMP14+EGFR (Completed)   Hemoglobin A1c (Completed)   B12 deficiency due to diet   Relevant Orders   Iron, TIBC and Ferritin Panel (Completed)   CBC with Differential/Platelet (Completed)   Vitamin B12 (Completed)   Other Visit Diagnoses       Thyroid disorder screening       Relevant Orders   TSH (Completed)     Skin tag       Relevant Orders   Ambulatory referral to Dermatology       Return in about 4 months (around 09/19/2023).   Total time spent: 25 minutes  [employer], NP  05/20/2023   This document may have been prepared by Dragon Voice Recognition software and as such may include unintentional dictation errors.

## 2023-05-21 ENCOUNTER — Other Ambulatory Visit: Payer: Self-pay | Admitting: Cardiology

## 2023-05-21 LAB — CMP14+EGFR
ALT: 10 IU/L (ref 0–32)
AST: 15 IU/L (ref 0–40)
Albumin: 3.9 g/dL (ref 3.9–4.9)
Alkaline Phosphatase: 61 IU/L (ref 44–121)
BUN/Creatinine Ratio: 15 (ref 12–28)
BUN: 12 mg/dL (ref 8–27)
Bilirubin Total: 0.3 mg/dL (ref 0.0–1.2)
CO2: 27 mmol/L (ref 20–29)
Calcium: 9.4 mg/dL (ref 8.7–10.3)
Chloride: 99 mmol/L (ref 96–106)
Creatinine, Ser: 0.82 mg/dL (ref 0.57–1.00)
Globulin, Total: 2.2 g/dL (ref 1.5–4.5)
Glucose: 86 mg/dL (ref 70–99)
Potassium: 4.4 mmol/L (ref 3.5–5.2)
Sodium: 139 mmol/L (ref 134–144)
Total Protein: 6.1 g/dL (ref 6.0–8.5)
eGFR: 79 mL/min/{1.73_m2} (ref >59)

## 2023-05-21 LAB — LIPID PANEL
Chol/HDL Ratio: 2.4 ratio (ref 0.0–4.4)
Cholesterol, Total: 137 mg/dL (ref 100–199)
HDL: 57 mg/dL (ref >39)
LDL Chol Calc (NIH): 58 mg/dL (ref 0–99)
Triglycerides: 126 mg/dL (ref 0–149)
VLDL Cholesterol Cal: 22 mg/dL (ref 5–40)

## 2023-05-21 LAB — VITAMIN B12: Vitamin B-12: 1458 pg/mL — ABNORMAL HIGH (ref 232–1245)

## 2023-05-21 LAB — IRON,TIBC AND FERRITIN PANEL
Ferritin: 34 ng/mL (ref 15–150)
Iron Saturation: 18 % (ref 15–55)
Iron: 65 ug/dL (ref 27–139)
Total Iron Binding Capacity: 360 ug/dL (ref 250–450)
UIBC: 295 ug/dL (ref 118–369)

## 2023-05-21 LAB — CBC WITH DIFFERENTIAL/PLATELET
Basophils Absolute: 0.1 10*3/uL (ref 0.0–0.2)
Basos: 1 %
EOS (ABSOLUTE): 0.4 10*3/uL (ref 0.0–0.4)
Eos: 5 %
Hematocrit: 43.8 % (ref 34.0–46.6)
Hemoglobin: 13.9 g/dL (ref 11.1–15.9)
Immature Grans (Abs): 0 10*3/uL (ref 0.0–0.1)
Immature Granulocytes: 0 %
Lymphocytes Absolute: 2.6 10*3/uL (ref 0.7–3.1)
Lymphs: 30 %
MCH: 27.9 pg (ref 26.6–33.0)
MCHC: 31.7 g/dL (ref 31.5–35.7)
MCV: 88 fL (ref 79–97)
Monocytes Absolute: 0.6 10*3/uL (ref 0.1–0.9)
Monocytes: 7 %
Neutrophils Absolute: 5 10*3/uL (ref 1.4–7.0)
Neutrophils: 57 %
Platelets: 336 10*3/uL (ref 150–450)
RBC: 4.99 x10E6/uL (ref 3.77–5.28)
RDW: 13 % (ref 11.7–15.4)
WBC: 8.7 10*3/uL (ref 3.4–10.8)

## 2023-05-21 LAB — TSH: TSH: 1.26 u[IU]/mL (ref 0.450–4.500)

## 2023-05-21 LAB — VITAMIN D 25 HYDROXY (VIT D DEFICIENCY, FRACTURES): Vit D, 25-Hydroxy: 77.5 ng/mL (ref 30.0–100.0)

## 2023-07-12 ENCOUNTER — Other Ambulatory Visit: Payer: Self-pay | Admitting: Cardiology

## 2023-09-18 DIAGNOSIS — R490 Dysphonia: Secondary | ICD-10-CM | POA: Diagnosis not present

## 2023-09-19 ENCOUNTER — Other Ambulatory Visit

## 2023-09-19 DIAGNOSIS — E559 Vitamin D deficiency, unspecified: Secondary | ICD-10-CM

## 2023-09-19 DIAGNOSIS — R7303 Prediabetes: Secondary | ICD-10-CM | POA: Diagnosis not present

## 2023-09-19 DIAGNOSIS — E538 Deficiency of other specified B group vitamins: Secondary | ICD-10-CM | POA: Diagnosis not present

## 2023-09-19 DIAGNOSIS — Z1329 Encounter for screening for other suspected endocrine disorder: Secondary | ICD-10-CM

## 2023-09-19 DIAGNOSIS — E785 Hyperlipidemia, unspecified: Secondary | ICD-10-CM | POA: Diagnosis not present

## 2023-09-20 LAB — CBC WITH DIFFERENTIAL/PLATELET
Basophils Absolute: 0.1 x10E3/uL (ref 0.0–0.2)
Basos: 1 %
EOS (ABSOLUTE): 0.6 x10E3/uL — ABNORMAL HIGH (ref 0.0–0.4)
Eos: 8 %
Hematocrit: 46.1 % (ref 34.0–46.6)
Hemoglobin: 14.6 g/dL (ref 11.1–15.9)
Immature Grans (Abs): 0 x10E3/uL (ref 0.0–0.1)
Immature Granulocytes: 0 %
Lymphocytes Absolute: 2.4 x10E3/uL (ref 0.7–3.1)
Lymphs: 32 %
MCH: 27.7 pg (ref 26.6–33.0)
MCHC: 31.7 g/dL (ref 31.5–35.7)
MCV: 88 fL (ref 79–97)
Monocytes Absolute: 0.7 x10E3/uL (ref 0.1–0.9)
Monocytes: 9 %
Neutrophils Absolute: 3.7 x10E3/uL (ref 1.4–7.0)
Neutrophils: 50 %
Platelets: 263 x10E3/uL (ref 150–450)
RBC: 5.27 x10E6/uL (ref 3.77–5.28)
RDW: 13.1 % (ref 11.7–15.4)
WBC: 7.4 x10E3/uL (ref 3.4–10.8)

## 2023-09-20 LAB — LIPID PANEL
Chol/HDL Ratio: 2.1 ratio (ref 0.0–4.4)
Cholesterol, Total: 147 mg/dL (ref 100–199)
HDL: 69 mg/dL (ref >39)
LDL Chol Calc (NIH): 61 mg/dL (ref 0–99)
Triglycerides: 89 mg/dL (ref 0–149)
VLDL Cholesterol Cal: 17 mg/dL (ref 5–40)

## 2023-09-20 LAB — CMP14+EGFR
ALT: 11 IU/L (ref 0–32)
AST: 15 IU/L (ref 0–40)
Albumin: 4.1 g/dL (ref 3.9–4.9)
Alkaline Phosphatase: 57 IU/L (ref 44–121)
BUN/Creatinine Ratio: 10 — ABNORMAL LOW (ref 12–28)
BUN: 8 mg/dL (ref 8–27)
Bilirubin Total: 0.5 mg/dL (ref 0.0–1.2)
CO2: 23 mmol/L (ref 20–29)
Calcium: 9.4 mg/dL (ref 8.7–10.3)
Chloride: 101 mmol/L (ref 96–106)
Creatinine, Ser: 0.8 mg/dL (ref 0.57–1.00)
Globulin, Total: 2 g/dL (ref 1.5–4.5)
Glucose: 96 mg/dL (ref 70–99)
Potassium: 4.2 mmol/L (ref 3.5–5.2)
Sodium: 141 mmol/L (ref 134–144)
Total Protein: 6.1 g/dL (ref 6.0–8.5)
eGFR: 81 mL/min/1.73 (ref >59)

## 2023-09-20 LAB — VITAMIN B12: Vitamin B-12: 587 pg/mL (ref 232–1245)

## 2023-09-20 LAB — TSH: TSH: 2.64 u[IU]/mL (ref 0.450–4.500)

## 2023-09-20 LAB — HEMOGLOBIN A1C
Est. average glucose Bld gHb Est-mCnc: 114 mg/dL
Hgb A1c MFr Bld: 5.6 % (ref 4.8–5.6)

## 2023-09-20 LAB — VITAMIN D 25 HYDROXY (VIT D DEFICIENCY, FRACTURES): Vit D, 25-Hydroxy: 76.8 ng/mL (ref 30.0–100.0)

## 2023-09-22 ENCOUNTER — Ambulatory Visit (INDEPENDENT_AMBULATORY_CARE_PROVIDER_SITE_OTHER): Admitting: Cardiology

## 2023-09-22 ENCOUNTER — Ambulatory Visit: Payer: Self-pay | Admitting: Cardiology

## 2023-09-22 ENCOUNTER — Encounter: Payer: Self-pay | Admitting: Cardiology

## 2023-09-22 VITALS — BP 140/74 | HR 85 | Ht 65.0 in | Wt 180.8 lb

## 2023-09-22 DIAGNOSIS — Z0130 Encounter for examination of blood pressure without abnormal findings: Secondary | ICD-10-CM

## 2023-09-22 DIAGNOSIS — R350 Frequency of micturition: Secondary | ICD-10-CM

## 2023-09-22 DIAGNOSIS — E785 Hyperlipidemia, unspecified: Secondary | ICD-10-CM | POA: Diagnosis not present

## 2023-09-22 DIAGNOSIS — R5383 Other fatigue: Secondary | ICD-10-CM | POA: Diagnosis not present

## 2023-09-22 DIAGNOSIS — E559 Vitamin D deficiency, unspecified: Secondary | ICD-10-CM

## 2023-09-22 LAB — POCT URINALYSIS DIPSTICK
Bilirubin, UA: NEGATIVE
Blood, UA: NEGATIVE
Glucose, UA: NEGATIVE
Ketones, UA: NEGATIVE
Leukocytes, UA: NEGATIVE
Nitrite, UA: NEGATIVE
Protein, UA: NEGATIVE
Spec Grav, UA: 1.01 (ref 1.010–1.025)
Urobilinogen, UA: 0.2 U/dL
pH, UA: 6.5 (ref 5.0–8.0)

## 2023-09-22 NOTE — Progress Notes (Signed)
 Patient notified before she left the UA was normal.

## 2023-09-22 NOTE — Progress Notes (Signed)
 Established Patient Office Visit  Subjective:  Patient ID: Debra Mccann, female    DOB: [birth year]  Age: 68 y.o. MRN: [id number]  Chief Complaint  Patient presents with   Follow-up    4 months follow up    Patient in office for 4 month follow up, discuss recent lab results. Patient doing well today. Patient complains of increase in frequency of urination over the past month. Denies pain or urgency. Will get a UA today, culture. Discussed recent lab work today. LDL at goal. Hgb A1c stable. Normal kidney function. Continue same medications.     No other concerns at this time.   Past Medical History:  Diagnosis Date   Asthma    Bronchitis    Cancer (HCC)    lung cancer   Cervical high risk HPV (human papillomavirus) test positive 10/24/2016   COPD (chronic obstructive pulmonary disease) (HCC)    Depression    Dyspnea    Family history of adverse reaction to anesthesia    sister gets PONV   GERD (gastroesophageal reflux disease)    Headache    History of primary non-small cell carcinoma of right lung s/p RUL resection 2004    HPV (human papilloma virus) infection 10/22/2016   Evaluated by GYN [facility]   LGSIL on Pap smear of cervix 10/24/2016   With HPV(+) and mild dysplasia   Lung cancer (HCC) 2004   Lung cancer (HCC)    OA (osteoarthritis) of hip    right   Osteopenia    Personal history of chemotherapy    Prediabetes 05/20/2023   Urticaria     Past Surgical History:  Procedure Laterality Date   ABDOMINAL HYSTERECTOMY  1997   partial, only ovaries remain; due to heavy bleeding   CHOLECYSTECTOMY  1999   COLONOSCOPY WITH PROPOFOL  N/A 05/19/2017   Procedure: COLONOSCOPY WITH PROPOFOL ;  Surgeon: Therisa Bi, MD;  Location: [facility] END[facility];  Service: Gastroenterology;  Laterality: N/A;   INSERTION OF MESH  12/25/2021   Procedure: INSERTION OF MESH;  Surgeon: Jordis Laneta FALCON, MD;  Location: [facility] ORS;  Service: General;;   LUNG CANCER SURGERY Right 2004    removed top portion of right lung   MICROLARYNGOSCOPY Bilateral 02/07/2023   Procedure: MICRODIRECT LARYNGOSCOPY WITN BIOPSY OF VOCAL CORDS;  Surgeon: Herminio Miu, MD;  Location: [facility];  Service: ENT;  Laterality: Bilateral;   VENTRAL HERNIA REPAIR N/A 12/25/2021   Procedure: HERNIA REPAIR VENTRAL ADULT, open;  Surgeon: Jordis Laneta FALCON, MD;  Location: [facility] ORS;  Service: General;  Laterality: N/A;    Social History   Socioeconomic History   Marital status: Married    Spouse name: Not on file   Number of children: Not on file   Years of education: Not on file   Highest education level: Not on file  Occupational History   Not on file  Tobacco Use   Smoking status: Every Day    Current packs/day: 0.50    Average packs/day: 0.5 packs/day for 23.0 years (11.5 ttl pk-yrs)    Types: Cigarettes   Smokeless tobacco: Never  Vaping Use   Vaping status: Never Used  Substance and Sexual Activity   Alcohol use: No    Alcohol/week: 0.0 standard drinks of alcohol   Drug use: No   Sexual activity: Yes  Other Topics Concern   Not on file  Social History Narrative   Not on file   Social Drivers of Health   Financial Resource Strain: Not  on file  Food Insecurity: No Food Insecurity (01/01/2022)   Hunger Vital Sign    Worried About Running Out of Food in the Last Year: Never true    Ran Out of Food in the Last Year: Never true  Transportation Needs: No Transportation Needs (01/01/2022)   PRAPARE - Administrator, Civil Service (Medical): No    Lack of Transportation (Non-Medical): No  Physical Activity: Not on file  Stress: Not on file  Social Connections: Not on file  Intimate Partner Violence: Not At Risk (01/01/2022)   Humiliation, Afraid, Rape, and Kick questionnaire    Fear of Current or Ex-Partner: No    Emotionally Abused: No    Physically Abused: No    Sexually Abused: No    Family History  Problem Relation Age of Onset   CAD Mother         with stents   Heart disease Mother    Hypertension Mother    Heart disease Father        3 vessel CABG   Cancer Father        melanoma   Hypertension Father    Gout Father    Depression Daughter    Alzheimer's disease Maternal Grandmother    Breast cancer Cousin    Diabetes Neg Hx    Stroke Neg Hx    COPD Neg Hx     Allergies  Allergen Reactions   Penicillins Hives    Outpatient Medications Prior to Visit  Medication Sig   albuterol  (VENTOLIN  HFA) 108 (90 Base) MCG/ACT inhaler Inhale 2 puffs into the lungs every 6 (six) hours as needed.   BREO ELLIPTA  200-25 MCG/ACT AEPB INHALE 1 PUFF INTO THE LUNGS DAILY.   cetirizine (ZYRTEC) 10 MG tablet Take 10 mg by mouth daily.   cholecalciferol  (VITAMIN D3) 10 MCG (400 UNIT) TABS tablet Take 400 Units by mouth daily.   citalopram  (CELEXA ) 20 MG tablet TAKE 1 TABLET EVERY DAY   hydrOXYzine (VISTARIL) 25 MG capsule Take 25 mg by mouth every 8 (eight) hours as needed.   ipratropium-albuterol  (DUONEB) 0.5-2.5 (3) MG/3ML SOLN Inhale 3 mLs into the lungs every 6 (six) hours as needed.   omeprazole  (PRILOSEC) 40 MG capsule TAKE 1 CAPSULE (40 MG TOTAL) BY MOUTH DAILY.   rosuvastatin  (CRESTOR ) 10 MG tablet TAKE 1 TABLET AT BEDTIME   SUMAtriptan  (IMITREX ) 25 MG tablet Take 2 tablets (50 mg total) by mouth once as needed for migraine. May repeat in 2 hours if headache persists or recurs.   SYMBICORT 80-4.5 MCG/ACT inhaler INHALE 2 PUFFS INTO THE LUNGS IN THE MORNING AND AT BEDTIME.   [DISCONTINUED] vitamin B-12 (CYANOCOBALAMIN ) 100 MCG tablet Take 100 mcg by mouth daily.   No facility-administered medications prior to visit.    Review of Systems  Constitutional: Negative.   HENT: Negative.    Eyes: Negative.   Respiratory: Negative.  Negative for shortness of breath.   Cardiovascular: Negative.  Negative for chest pain.  Gastrointestinal: Negative.  Negative for abdominal pain, constipation and diarrhea.  Genitourinary: Negative.    Musculoskeletal:  Negative for joint pain and myalgias.  Skin: Negative.   Neurological: Negative.  Negative for dizziness and headaches.  Endo/Heme/Allergies: Negative.   All other systems reviewed and are negative.      Objective:   BP (!) 140/74   Pulse 85   Ht 5' 5 (1.651 m)   Wt 180 lb 12.8 oz (82 kg)   SpO2 91%  BMI 30.09 kg/m   Vitals:   09/22/23 1052  BP: (!) 140/74  Pulse: 85  Height: 5' 5 (1.651 m)  Weight: 180 lb 12.8 oz (82 kg)  SpO2: 91%  BMI (Calculated): 30.09    Physical Exam Vitals and nursing note reviewed.  Constitutional:      Appearance: Normal appearance. She is normal weight.  HENT:     Head: Normocephalic and atraumatic.     Nose: Nose normal.     Mouth/Throat:     Mouth: Mucous membranes are moist.  Eyes:     Extraocular Movements: Extraocular movements intact.     Conjunctiva/sclera: Conjunctivae normal.     Pupils: Pupils are equal, round, and reactive to light.  Cardiovascular:     Rate and Rhythm: Normal rate and regular rhythm.     Pulses: Normal pulses.     Heart sounds: Normal heart sounds.  Pulmonary:     Effort: Pulmonary effort is normal.     Breath sounds: Normal breath sounds.  Abdominal:     General: Abdomen is flat. Bowel sounds are normal.     Palpations: Abdomen is soft.  Musculoskeletal:        General: Normal range of motion.     Cervical back: Normal range of motion.  Skin:    General: Skin is warm and dry.  Neurological:     General: No focal deficit present.     Mental Status: She is alert and oriented to person, place, and time.  Psychiatric:        Mood and Affect: Mood normal.        Behavior: Behavior normal.        Thought Content: Thought content normal.        Judgment: Judgment normal.      Results for orders placed or performed in visit on 09/22/23  POCT Urinalysis Dipstick (81002)  Result Value Ref Range   Color, UA     Clarity, UA     Glucose, UA Negative Negative   Bilirubin, UA  Negative    Ketones, UA Negative    Spec Grav, UA 1.010 1.010 - 1.025   Blood, UA Negative    pH, UA 6.5 5.0 - 8.0   Protein, UA Negative Negative   Urobilinogen, UA 0.2 0.2 or 1.0 E.U./dL   Nitrite, UA Negative    Leukocytes, UA Negative Negative   Appearance     Odor      Recent Results (from the past 2160 hours)  Hemoglobin A1c     Status: None   Collection Time: 09/19/23  8:15 AM  Result Value Ref Range   Hgb A1c MFr Bld 5.6 4.8 - 5.6 %    Comment:          Prediabetes: 5.7 - 6.4          Diabetes: >6.4          Glycemic control for adults with diabetes: <7.0    Est. average glucose Bld gHb Est-mCnc 114 mg/dL  Lipid Profile     Status: None   Collection Time: 09/19/23  8:15 AM  Result Value Ref Range   Cholesterol, Total 147 100 - 199 mg/dL   Triglycerides 89 0 - 149 mg/dL   HDL 69 >60 mg/dL   VLDL Cholesterol Cal 17 5 - 40 mg/dL   LDL Chol Calc (NIH) 61 0 - 99 mg/dL   Chol/HDL Ratio 2.1 0.0 - 4.4 ratio    Comment:  T. Chol/HDL Ratio                                             Men  Women                               1/2 Avg.Risk  3.4    3.3                                   Avg.Risk  5.0    4.4                                2X Avg.Risk  9.6    7.1                                3X Avg.Risk 23.4   11.0   CMP14+EGFR     Status: Abnormal   Collection Time: 09/19/23  8:15 AM  Result Value Ref Range   Glucose 96 70 - 99 mg/dL   BUN 8 8 - 27 mg/dL   Creatinine, Ser 9.19 0.57 - 1.00 mg/dL   eGFR 81 >40 fO/fpw/8.26   BUN/Creatinine Ratio 10 (L) 12 - 28   Sodium 141 134 - 144 mmol/L   Potassium 4.2 3.5 - 5.2 mmol/L   Chloride 101 96 - 106 mmol/L   CO2 23 20 - 29 mmol/L   Calcium  9.4 8.7 - 10.3 mg/dL   Total Protein 6.1 6.0 - 8.5 g/dL   Albumin 4.1 3.9 - 4.9 g/dL   Globulin, Total 2.0 1.5 - 4.5 g/dL   Bilirubin Total 0.5 0.0 - 1.2 mg/dL   Alkaline Phosphatase 57 44 - 121 IU/L   AST 15 0 - 40 IU/L   ALT 11 0 - 32 IU/L  TSH      Status: None   Collection Time: 09/19/23  8:15 AM  Result Value Ref Range   TSH 2.640 0.450 - 4.500 uIU/mL  Vitamin D  (25 hydroxy)     Status: None   Collection Time: 09/19/23  8:15 AM  Result Value Ref Range   Vit D, 25-Hydroxy 76.8 30.0 - 100.0 ng/mL    Comment: Vitamin D  deficiency has been defined by the Institute of Medicine and an Endocrine Society practice guideline as a level of serum 25-OH vitamin D  less than 20 ng/mL (1,2). The Endocrine Society went on to further define vitamin D  insufficiency as a level between 21 and 29 ng/mL (2). 1. IOM (Institute of Medicine). 2010. Dietary reference    intakes for calcium  and D. [place]: The    [employer]. 2. Holick MF, Binkley [place], Bischoff-Ferrari HA, et al.    Evaluation, treatment, and prevention of vitamin D     deficiency: an Endocrine Society clinical practice    guideline. JCEM. 2011 Jul; 96(7):1911-30.   Vitamin B12     Status: None   Collection Time: 09/19/23  8:15 AM  Result Value Ref Range   Vitamin B-12 587 232 - 1,245 pg/mL  CBC with Differential/Platelet     Status: Abnormal   Collection Time: 09/19/23  8:15 AM  Result Value Ref  Range   WBC 7.4 3.4 - 10.8 x10E3/uL   RBC 5.27 3.77 - 5.28 x10E6/uL   Hemoglobin 14.6 11.1 - 15.9 g/dL   Hematocrit 53.8 65.9 - 46.6 %   MCV 88 79 - 97 fL   MCH 27.7 26.6 - 33.0 pg   MCHC 31.7 31.5 - 35.7 g/dL   RDW 86.8 88.2 - 84.5 %   Platelets 263 150 - 450 x10E3/uL   Neutrophils 50 Not Estab. %   Lymphs 32 Not Estab. %   Monocytes 9 Not Estab. %   Eos 8 Not Estab. %   Basos 1 Not Estab. %   Neutrophils Absolute 3.7 1.4 - 7.0 x10E3/uL   Lymphocytes Absolute 2.4 0.7 - 3.1 x10E3/uL   Monocytes Absolute 0.7 0.1 - 0.9 x10E3/uL   EOS (ABSOLUTE) 0.6 (H) 0.0 - 0.4 x10E3/uL   Basophils Absolute 0.1 0.0 - 0.2 x10E3/uL   Immature Granulocytes 0 Not Estab. %   Immature Grans (Abs) 0.0 0.0 - 0.1 x10E3/uL  POCT Urinalysis Dipstick (18997)     Status: Normal   Collection  Time: 09/22/23 11:18 AM  Result Value Ref Range   Color, UA     Clarity, UA     Glucose, UA Negative Negative   Bilirubin, UA Negative    Ketones, UA Negative    Spec Grav, UA 1.010 1.010 - 1.025   Blood, UA Negative    pH, UA 6.5 5.0 - 8.0   Protein, UA Negative Negative   Urobilinogen, UA 0.2 0.2 or 1.0 E.U./dL   Nitrite, UA Negative    Leukocytes, UA Negative Negative   Appearance     Odor        Assessment & Plan:  UA, urine culture today Continue same medications  Problem List Items Addressed This Visit       Other   Vitamin D  deficiency   Fatigue   Relevant Orders   POCT Urinalysis Dipstick (18997) (Completed)   Dyslipidemia - Primary   Other Visit Diagnoses       Urinary frequency       Relevant Orders   Urine Culture       Return in about 4 months (around 01/23/2024) for fasting labs prior.   Total time spent: 25 minutes  [employer], NP  09/22/2023   This document may have been prepared by Dragon Voice Recognition software and as such may include unintentional dictation errors.

## 2023-09-24 LAB — URINE CULTURE

## 2023-09-24 NOTE — Progress Notes (Signed)
Pt informed

## 2023-10-06 ENCOUNTER — Ambulatory Visit: Admitting: Dermatology

## 2023-10-06 ENCOUNTER — Encounter: Payer: Self-pay | Admitting: Dermatology

## 2023-10-06 DIAGNOSIS — D492 Neoplasm of unspecified behavior of bone, soft tissue, and skin: Secondary | ICD-10-CM | POA: Diagnosis not present

## 2023-10-06 DIAGNOSIS — C4492 Squamous cell carcinoma of skin, unspecified: Secondary | ICD-10-CM

## 2023-10-06 DIAGNOSIS — C44519 Basal cell carcinoma of skin of other part of trunk: Secondary | ICD-10-CM | POA: Diagnosis not present

## 2023-10-06 DIAGNOSIS — L929 Granulomatous disorder of the skin and subcutaneous tissue, unspecified: Secondary | ICD-10-CM

## 2023-10-06 DIAGNOSIS — L814 Other melanin hyperpigmentation: Secondary | ICD-10-CM

## 2023-10-06 DIAGNOSIS — C44722 Squamous cell carcinoma of skin of right lower limb, including hip: Secondary | ICD-10-CM | POA: Diagnosis not present

## 2023-10-06 DIAGNOSIS — L578 Other skin changes due to chronic exposure to nonionizing radiation: Secondary | ICD-10-CM

## 2023-10-06 DIAGNOSIS — Z1283 Encounter for screening for malignant neoplasm of skin: Secondary | ICD-10-CM | POA: Diagnosis not present

## 2023-10-06 DIAGNOSIS — D099 Carcinoma in situ, unspecified: Secondary | ICD-10-CM

## 2023-10-06 DIAGNOSIS — C4491 Basal cell carcinoma of skin, unspecified: Secondary | ICD-10-CM

## 2023-10-06 DIAGNOSIS — D229 Melanocytic nevi, unspecified: Secondary | ICD-10-CM

## 2023-10-06 DIAGNOSIS — C4441 Basal cell carcinoma of skin of scalp and neck: Secondary | ICD-10-CM

## 2023-10-06 DIAGNOSIS — D1801 Hemangioma of skin and subcutaneous tissue: Secondary | ICD-10-CM

## 2023-10-06 DIAGNOSIS — D0439 Carcinoma in situ of skin of other parts of face: Secondary | ICD-10-CM | POA: Diagnosis not present

## 2023-10-06 DIAGNOSIS — W908XXA Exposure to other nonionizing radiation, initial encounter: Secondary | ICD-10-CM

## 2023-10-06 DIAGNOSIS — L821 Other seborrheic keratosis: Secondary | ICD-10-CM

## 2023-10-06 DIAGNOSIS — D0430 Carcinoma in situ of skin of unspecified part of face: Secondary | ICD-10-CM

## 2023-10-06 DIAGNOSIS — L920 Granuloma annulare: Secondary | ICD-10-CM | POA: Diagnosis not present

## 2023-10-06 DIAGNOSIS — Z85820 Personal history of malignant melanoma of skin: Secondary | ICD-10-CM

## 2023-10-06 HISTORY — DX: Squamous cell carcinoma of skin, unspecified: C44.92

## 2023-10-06 HISTORY — DX: Basal cell carcinoma of skin, unspecified: C44.91

## 2023-10-06 HISTORY — DX: Carcinoma in situ, unspecified: D09.9

## 2023-10-06 NOTE — Patient Instructions (Addendum)
 Cryotherapy Aftercare  Wash gently with soap and water everyday.   Apply Vaseline and Band-Aid daily until healed.   Wound Care Instructions  Cleanse wound gently with soap and water once a day then pat dry with clean gauze. Apply a thin coat of Petrolatum (petroleum jelly, Vaseline) over the wound (unless you have an allergy to this). We recommend that you use a new, sterile tube of Vaseline. Do not pick or remove scabs. Do not remove the yellow or white healing tissue from the base of the wound.  Cover the wound with fresh, clean, nonstick gauze and secure with paper tape. You may use Band-Aids in place of gauze and tape if the wound is small enough, but would recommend trimming much of the tape off as there is often too much. Sometimes Band-Aids can irritate the skin.  You should call the office for your biopsy report after 1 week if you have not already been contacted.  If you experience any problems, such as abnormal amounts of bleeding, swelling, significant bruising, significant pain, or evidence of infection, please call the office immediately.  FOR ADULT SURGERY PATIENTS: If you need something for pain relief you may take 1 extra strength Tylenol  (acetaminophen ) AND 2 Ibuprofen (200mg  each) together every 4 hours as needed for pain. (do not take these if you are allergic to them or if you have a reason you should not take them.) Typically, you may only need pain medication for 1 to 3 days.     Due to recent changes in healthcare laws, you may see results of your pathology and/or laboratory studies on MyChart before the doctors have had a chance to review them. We understand that in some cases there may be results that are confusing or concerning to you. Please understand that not all results are received at the same time and often the doctors may need to interpret multiple results in order to provide you with the best plan of care or course of treatment. Therefore, we ask that you  please give us  2 business days to thoroughly review all your results before contacting the office for clarification. Should we see a critical lab result, you will be contacted sooner.   If You Need Anything After Your Visit  If you have any questions or concerns for your doctor, please call our main line at [phone] and press option 4 to reach your doctor's medical assistant. If no one answers, please leave a voicemail as directed and we will return your call as soon as possible. Messages left after 4 pm will be answered the following business day.   You may also send us  a message via MyChart. We typically respond to MyChart messages within 1-2 business days.  For prescription refills, please ask your pharmacy to contact our office. Our fax number is [phone].  If you have an urgent issue when the clinic is closed that cannot wait until the next business day, you can page your doctor at the number below.    Please note that while we do our best to be available for urgent issues outside of office hours, we are not available 24/7.   If you have an urgent issue and are unable to reach us , you may choose to seek medical care at your doctor's office, retail clinic, urgent care center, or emergency room.  If you have a medical emergency, please immediately call 911 or go to the emergency department.  Pager Numbers  - Dr. Hester: [phone]  -  Dr. Jackquline: [phone]  - Dr. Claudene: [phone]   In the event of inclement weather, please call our main line at [phone] for an update on the status of any delays or closures.  Dermatology Medication Tips: Please keep the boxes that topical medications come in in order to help keep track of the instructions about where and how to use these. Pharmacies typically print the medication instructions only on the boxes and not directly on the medication tubes.   If your medication is too expensive, please contact our office at  [phone] option 4 or send us  a message through MyChart.   We are unable to tell what your co-pay for medications will be in advance as this is different depending on your insurance coverage. However, we may be able to find a substitute medication at lower cost or fill out paperwork to get insurance to cover a needed medication.   If a prior authorization is required to get your medication covered by your insurance company, please allow us  1-2 business days to complete this process.  Drug prices often vary depending on where the prescription is filled and some pharmacies may offer cheaper prices.  The website www.goodrx.com contains coupons for medications through different pharmacies. The prices here do not account for what the cost may be with help from insurance (it may be cheaper with your insurance), but the website can give you the price if you did not use any insurance.  - You can print the associated coupon and take it with your prescription to the pharmacy.  - You may also stop by our office during regular business hours and pick up a GoodRx coupon card.  - If you need your prescription sent electronically to a different pharmacy, notify our office through [facility] or by phone at [phone] option 4.     Si Usted Necesita Algo Despus de Su Visita  Tambin puede enviarnos un mensaje a travs de Clinical cytogeneticist. Por lo general respondemos a los mensajes de MyChart en el transcurso de 1 a 2 das hbiles.  Para renovar recetas, por favor pida a su farmacia que se ponga en contacto con nuestra oficina. Randi lakes de fax es [place] [phone].  Si tiene un asunto urgente cuando la clnica est cerrada y que no puede esperar hasta el siguiente da hbil, puede llamar/localizar a su doctor(a) al nmero que aparece a continuacin.   Por favor, tenga en cuenta que aunque hacemos todo lo posible para estar disponibles para asuntos urgentes fuera del horario de [place], no estamos  disponibles las 24 horas del da, los 7 [address]  [address] de la [place].   Si tiene un problema urgente y no puede comunicarse con nosotros, puede optar por buscar atencin mdica  en el consultorio de su doctor(a), en una clnica privada, en un centro de atencin urgente o en una sala de emergencias.  Si tiene Engineer, drilling, por favor llame inmediatamente al 911 o vaya a la sala de emergencias.  Nmeros de bper  - Dr. Hester: [phone]  - Dra. Jackquline: [phone]  - Dr. Claudene: [phone]   En caso de inclemencias del tiempo, por favor llame a landry capes principal al [phone] para una actualizacin sobre el [place] de cualquier retraso o cierre.  Consejos para la medicacin en dermatologa: Por favor, guarde las cajas en las que vienen los medicamentos de uso tpico para ayudarle a seguir las instrucciones sobre dnde y cmo usarlos. Las farmacias generalmente imprimen las instrucciones del medicamento slo en las  cajas y no directamente en los tubos del medicamento.   Si su medicamento es muy caro, por favor, pngase en contacto con landry rieger llamando al [phone] y presione la opcin 4 o envenos un mensaje a travs de Clinical cytogeneticist.   No podemos decirle cul ser su copago por los medicamentos por adelantado ya que esto es diferente dependiendo de la cobertura de su seguro. Sin embargo, es posible que podamos encontrar un medicamento sustituto a Audiological scientist un formulario para que el seguro cubra el medicamento que se considera necesario.   Si se requiere una autorizacin previa para que su compaa de seguros malta su medicamento, por favor permtanos de 1 a 2 das hbiles para completar este proceso.  Los precios de los medicamentos varan con frecuencia dependiendo del Environmental consultant de dnde se surte la receta y alguna farmacias pueden ofrecer precios ms baratos.  El sitio web www.goodrx.com tiene cupones para medicamentos de Health and safety inspector. Los precios aqu no  tienen en cuenta lo que podra costar con la ayuda del seguro (puede ser ms barato con su seguro), pero el sitio web puede darle el precio si no utiliz Tourist information centre manager.  - Puede imprimir el cupn correspondiente y llevarlo con su receta a la farmacia.  - Tambin puede pasar por nuestra oficina durante el horario de atencin regular y Education officer, museum una tarjeta de cupones de GoodRx.  - Si necesita que su receta se enve electrnicamente a una farmacia diferente, informe a nuestra oficina a travs de MyChart de [place] o por telfono llamando al [phone] y presione la opcin 4.

## 2023-10-06 NOTE — Progress Notes (Signed)
 New Patient Visit   Subjective  Debra Mccann is a 68 y.o. female who presents for the following: Skin Cancer Screening and Full Body Skin Exam.   Patient reports something taken off hand several years ago, but was benign. Family hx of skin cancer in father (melanoma).   Place at L cheek, L neck, R lateral calf, bump at R elbow is itchy, and couple of itchy places at chest that also get red.    The patient presents for Total-Body Skin Exam (TBSE) for skin cancer screening and mole check. The patient has spots, moles and lesions to be evaluated, some may be new or changing and the patient may have concern these could be cancer.   The following portions of the chart were reviewed this encounter and updated as appropriate: medications, allergies, medical history  Review of Systems:  No other skin or systemic complaints except as noted in HPI or Assessment and Plan.  Objective  Well appearing patient in no apparent distress; mood and affect are within normal limits.  A full examination was performed including scalp, head, eyes, ears, nose, lips, neck, chest, axillae, abdomen, back, buttocks, bilateral upper extremities, bilateral lower extremities, hands, feet, fingers, toes, fingernails, and toenails. All findings within normal limits unless otherwise noted below.   Relevant physical exam findings are noted in the Assessment and Plan.  Right lower lateral leg 9 mm pink scaly papule  Left lateral cheek 6 mm pink scaly papule  Left neck 3 mm pink scaly papule   Right elbow Pink indurated papules   Assessment & Plan   SKIN CANCER SCREENING PERFORMED TODAY.  ACTINIC DAMAGE - Chronic condition, secondary to cumulative UV/sun exposure - diffuse scaly erythematous macules with underlying dyspigmentation - Recommend daily broad spectrum sunscreen SPF 30+ to sun-exposed areas, reapply every 2 hours as needed.  - Staying in the shade or wearing long sleeves, sun glasses (UVA+UVB  protection) and wide brim hats (4-inch brim around the entire circumference of the hat) are also recommended for sun protection.  - Call for new or changing lesions.  LENTIGINES, SEBORRHEIC KERATOSES, HEMANGIOMAS - Benign normal skin lesions - Benign-appearing - Call for any changes  MELANOCYTIC NEVI - Tan-brown and/or pink-flesh-colored symmetric macules and papules - Benign appearing on exam today - Observation - Call clinic for new or changing moles - Recommend daily use of broad spectrum spf 30+ sunscreen to sun-exposed areas.   FAMILY HISTORY OF SKIN CANCER What type(s): Melanoma Who affected: Father    NEOPLASM OF SKIN (4) Right lower lateral leg Skin / nail biopsy Type of biopsy: tangential   Informed consent: discussed and consent obtained   Timeout: patient name, date of birth, surgical site, and procedure verified   Procedure prep:  Patient was prepped and draped in usual sterile fashion Prep type:  Isopropyl alcohol Anesthesia: the lesion was anesthetized in a standard fashion   Anesthetic:  1% lidocaine  w/ epinephrine  1-100,000 buffered w/ 8.4% NaHCO3 Instrument used: DermaBlade   Hemostasis achieved with: pressure and aluminum chloride   Outcome: patient tolerated procedure well   Post-procedure details: sterile dressing applied and wound care instructions given   Dressing type: bandage and petrolatum    Specimen 1 - Surgical pathology Differential Diagnosis: SCC  Check Margins: No 9 mm pink scaly papule Left lateral cheek Skin / nail biopsy Type of biopsy: tangential   Informed consent: discussed and consent obtained   Timeout: patient name, date of birth, surgical site, and procedure verified  Procedure prep:  Patient was prepped and draped in usual sterile fashion Prep type:  Isopropyl alcohol Anesthesia: the lesion was anesthetized in a standard fashion   Anesthetic:  1% lidocaine  w/ epinephrine  1-100,000 buffered w/ 8.4% NaHCO3 Instrument used:  DermaBlade   Hemostasis achieved with: pressure and aluminum chloride   Outcome: patient tolerated procedure well   Post-procedure details: sterile dressing applied and wound care instructions given   Dressing type: bandage and petrolatum    Specimen 2 - Surgical pathology Differential Diagnosis: SCC  Check Margins: No 6 mm pink scaly papule Left neck Skin / nail biopsy Type of biopsy: tangential   Informed consent: discussed and consent obtained   Timeout: patient name, date of birth, surgical site, and procedure verified   Procedure prep:  Patient was prepped and draped in usual sterile fashion Prep type:  Isopropyl alcohol Anesthesia: the lesion was anesthetized in a standard fashion   Anesthetic:  1% lidocaine  w/ epinephrine  1-100,000 buffered w/ 8.4% NaHCO3 Instrument used: DermaBlade   Hemostasis achieved with: pressure and aluminum chloride   Outcome: patient tolerated procedure well   Post-procedure details: sterile dressing applied and wound care instructions given   Dressing type: bandage and petrolatum    Specimen 3 - Surgical pathology Differential Diagnosis: BCC vs SCC  Check Margins: No 3 mm pink scaly papule Right elbow Skin / nail biopsy Type of biopsy: tangential   Informed consent: discussed and consent obtained   Timeout: patient name, date of birth, surgical site, and procedure verified   Procedure prep:  Patient was prepped and draped in usual sterile fashion Prep type:  Isopropyl alcohol Anesthesia: the lesion was anesthetized in a standard fashion   Anesthetic:  1% lidocaine  w/ epinephrine  1-100,000 buffered w/ 8.4% NaHCO3 Instrument used: DermaBlade   Hemostasis achieved with: pressure and aluminum chloride   Outcome: patient tolerated procedure well   Post-procedure details: sterile dressing applied and wound care instructions given   Dressing type: bandage and petrolatum    Specimen 4 - Surgical pathology Differential Diagnosis: granuloma  annulare vs lichen simplex chronicus vs other  Check Margins: No Pink indurated papules LENTIGINES   MULTIPLE BENIGN NEVI   SEBORRHEIC KERATOSES   ACTINIC ELASTOSIS   CHERRY ANGIOMA    Return in about 1 year (around 10/05/2024) for TBSE, w/ Dr. Claudene.  I, Jacquelynn V. Wilfred, CMA, am acting as scribe for Boneta Claudene, MD .   Documentation: I have reviewed the above documentation for accuracy and completeness, and I agree with the above.  Boneta Claudene, MD

## 2023-10-13 ENCOUNTER — Ambulatory Visit: Payer: Self-pay | Admitting: Dermatology

## 2023-10-13 DIAGNOSIS — C44722 Squamous cell carcinoma of skin of right lower limb, including hip: Secondary | ICD-10-CM

## 2023-10-13 DIAGNOSIS — C4441 Basal cell carcinoma of skin of scalp and neck: Secondary | ICD-10-CM

## 2023-10-13 DIAGNOSIS — D099 Carcinoma in situ, unspecified: Secondary | ICD-10-CM

## 2023-10-13 LAB — SURGICAL PATHOLOGY

## 2023-10-14 NOTE — Telephone Encounter (Signed)
 Discussed pathology results and treatment plan/options. Patient prefers Mohs for all 3 sites and prefers Dr. Bluford at [facility]. She will use husband's steroid cream as needed for GA symptoms. Referral sent to Dr. Bluford.

## 2023-10-14 NOTE — Telephone Encounter (Signed)
-----   Message from [facility] sent at 10/13/2023  6:37 PM EDT ----- Diagnosis:  1. Skin, right lower lateral leg :       WELL DIFFERENTIATED SQUAMOUS CELL CARCINOMA, 0.7MM        2. Skin, left lateral cheek :       SQUAMOUS CELL CARCINOMA IN SITU        3. Skin, left neck :       BASAL CELL CARCINOMA, MICRONODULAR PATTERN        4. Skin, right elbow :       GRANULOMA ANNULARE    Please call  R lower leg  Explanation: This is a squamous cell skin cancer that has grown beyond the surface of the skin and is invading the second layer of the skin. It has the potential to spread beyond the skin and  threaten your health, so I recommend treating it.  Treatment: Given the location and type of skin cancer, I recommend Mohs surgery. Mohs surgery involves cutting out the skin cancer and then checking under the microscope to ensure the whole skin  cancer was removed. If any skin cancer remains, the surgeon will cut out more until it is fully removed. The cure rate is about 98-99%. Once the Mohs surgeon confirms the skin cancer is out, they  will discuss the options to repair or heal the area. You must take it easy for about two weeks after surgery (no lifting over 10-15 lbs, avoid activity to get your heart rate and blood pressure up).  It is done at another office outside of [place] ([place], [place], or [place]).  2. L cheek Explanation: squamous cell skin cancer in the top layer of skin Treatment: 5FU/calcipotriene BID x 7 days or Mohs  3. L neck BCC in second layer of skin Mohs  4. R elbow Rash biopsy shows benign collection of immune cells called histiocytes. Rash is called granuloma annulare. No treatment is required. Can do triamcinolone BID as needed for itching. Rash may last  for months or years but is not harmful. Contact us  if rapidly developing new spots ----- Message ----- From: Interface, [facility] Sent: 10/13/2023   4:20 PM EDT To:  Boneta Sharps, MD

## 2023-11-28 DIAGNOSIS — D385 Neoplasm of uncertain behavior of other respiratory organs: Secondary | ICD-10-CM | POA: Diagnosis not present

## 2023-11-30 DIAGNOSIS — E785 Hyperlipidemia, unspecified: Secondary | ICD-10-CM | POA: Diagnosis not present

## 2023-11-30 DIAGNOSIS — Z85118 Personal history of other malignant neoplasm of bronchus and lung: Secondary | ICD-10-CM | POA: Diagnosis not present

## 2023-11-30 DIAGNOSIS — J383 Other diseases of vocal cords: Secondary | ICD-10-CM | POA: Diagnosis not present

## 2023-11-30 DIAGNOSIS — F320 Major depressive disorder, single episode, mild: Secondary | ICD-10-CM | POA: Diagnosis not present

## 2023-11-30 DIAGNOSIS — J439 Emphysema, unspecified: Secondary | ICD-10-CM | POA: Diagnosis not present

## 2023-11-30 DIAGNOSIS — R7303 Prediabetes: Secondary | ICD-10-CM | POA: Diagnosis not present

## 2023-11-30 DIAGNOSIS — F419 Anxiety disorder, unspecified: Secondary | ICD-10-CM | POA: Diagnosis not present

## 2023-11-30 DIAGNOSIS — R030 Elevated blood-pressure reading, without diagnosis of hypertension: Secondary | ICD-10-CM | POA: Diagnosis not present

## 2023-11-30 DIAGNOSIS — H269 Unspecified cataract: Secondary | ICD-10-CM | POA: Diagnosis not present

## 2023-11-30 DIAGNOSIS — K219 Gastro-esophageal reflux disease without esophagitis: Secondary | ICD-10-CM | POA: Diagnosis not present

## 2023-11-30 DIAGNOSIS — E669 Obesity, unspecified: Secondary | ICD-10-CM | POA: Diagnosis not present

## 2023-11-30 DIAGNOSIS — F17210 Nicotine dependence, cigarettes, uncomplicated: Secondary | ICD-10-CM | POA: Diagnosis not present

## 2023-11-30 DIAGNOSIS — Z8249 Family history of ischemic heart disease and other diseases of the circulatory system: Secondary | ICD-10-CM | POA: Diagnosis not present

## 2023-11-30 DIAGNOSIS — Z809 Family history of malignant neoplasm, unspecified: Secondary | ICD-10-CM | POA: Diagnosis not present

## 2023-12-02 ENCOUNTER — Encounter: Payer: Self-pay | Admitting: Unknown Physician Specialty

## 2023-12-02 ENCOUNTER — Other Ambulatory Visit: Payer: Self-pay | Admitting: Unknown Physician Specialty

## 2023-12-03 ENCOUNTER — Encounter: Payer: Self-pay | Admitting: Unknown Physician Specialty

## 2023-12-03 NOTE — Anesthesia Preprocedure Evaluation (Signed)
 Anesthesia Evaluation  Patient identified by MRN, date of birth, ID band Patient awake    Reviewed: Allergy & Precautions, H&P , NPO status , Patient's Chart, lab work & pertinent test results  History of Anesthesia Complications (+) Family history of anesthesia reaction  Airway Mallampati: II  TM Distance: >3 FB Neck ROM: Full    Dental no notable dental hx. (+) Missing, Caps, Chipped  (+) Chipped, Caps, Dental Advisory Given, Missing, Poor Dentition  :   Pulmonary neg pulmonary ROS, shortness of breath, COPD, Current Smoker and Patient abstained from smoking.   Pulmonary exam normal breath sounds clear to auscultation       Cardiovascular negative cardio ROS Normal cardiovascular exam Rhythm:Regular Rate:Normal     Neuro/Psych  Headaches PSYCHIATRIC DISORDERS Anxiety Depression    negative neurological ROS  negative psych ROS   GI/Hepatic negative GI ROS, Neg liver ROS,GERD  ,,  Endo/Other  negative endocrine ROS    Renal/GU negative Renal ROS  negative genitourinary   Musculoskeletal negative musculoskeletal ROS (+) Arthritis ,    Abdominal   Peds negative pediatric ROS (+)  Hematology negative hematology ROS (+)   Anesthesia Other Findings Hx microlaryngoscopy 02-07-23  Osteopenia  Depression COPD (chronic obstructive pulmonary disease) (HCC) Urticaria OA (osteoarthritis) of hip  Bronchitis Personal history of chemotherapy  HPV (human papilloma virus) infection LGSIL on Pap smear of cervix  Cervical high risk HPV (human papillomavirus) test positive Cancer  History of primary non-small cell carcinoma of right lung s/p RUL resection 2004 Headache  Dyspnea GERD (gastroesophageal reflux disease)  Family history of adverse reaction to anesthesia--family member PONV Prediabetes  Anxiety Fatigue  Dysphonia    Reproductive/Obstetrics negative OB ROS                               Anesthesia Physical Anesthesia Plan  ASA: 3  Anesthesia Plan: General   Post-op Pain Management:    Induction: Intravenous  PONV Risk Score and Plan:   Airway Management Planned: Natural Airway and Nasal Cannula  Additional Equipment:   Intra-op Plan:   Post-operative Plan:   Informed Consent: I have reviewed the patients History and Physical, chart, labs and discussed the procedure including the risks, benefits and alternatives for the proposed anesthesia with the patient or authorized representative who has indicated his/her understanding and acceptance.     Dental Advisory Given  Plan Discussed with: Anesthesiologist, CRNA and Surgeon  Anesthesia Plan Comments: (Patient consented for risks of anesthesia including but not limited to:  - adverse reactions to medications - risk of airway placement if required - damage to eyes, teeth, lips or other oral mucosa - nerve damage due to positioning  - sore throat or hoarseness - Damage to heart, brain, nerves, lungs, other parts of body or loss of life  Patient voiced understanding and assent.)         Anesthesia Quick Evaluation

## 2023-12-05 ENCOUNTER — Encounter
Admission: RE | Disposition: A | Discharge status: Home / Self Care | Payer: Self-pay | Source: Home / Self Care | Attending: Unknown Physician Specialty

## 2023-12-05 ENCOUNTER — Ambulatory Visit
Admission: RE | Admit: 2023-12-05 | Discharge: 2023-12-05 | Disposition: A | Discharge status: Home / Self Care | Attending: Unknown Physician Specialty | Admitting: Unknown Physician Specialty

## 2023-12-05 ENCOUNTER — Ambulatory Visit: Payer: Self-pay | Admitting: Anesthesiology

## 2023-12-05 ENCOUNTER — Other Ambulatory Visit: Payer: Self-pay

## 2023-12-05 ENCOUNTER — Encounter: Payer: Self-pay | Admitting: Unknown Physician Specialty

## 2023-12-05 DIAGNOSIS — L720 Epidermal cyst: Secondary | ICD-10-CM | POA: Insufficient documentation

## 2023-12-05 DIAGNOSIS — D140 Benign neoplasm of middle ear, nasal cavity and accessory sinuses: Secondary | ICD-10-CM | POA: Diagnosis not present

## 2023-12-05 DIAGNOSIS — D385 Neoplasm of uncertain behavior of other respiratory organs: Secondary | ICD-10-CM | POA: Insufficient documentation

## 2023-12-05 DIAGNOSIS — F419 Anxiety disorder, unspecified: Secondary | ICD-10-CM | POA: Diagnosis not present

## 2023-12-05 DIAGNOSIS — J341 Cyst and mucocele of nose and nasal sinus: Secondary | ICD-10-CM | POA: Diagnosis not present

## 2023-12-05 DIAGNOSIS — J449 Chronic obstructive pulmonary disease, unspecified: Secondary | ICD-10-CM | POA: Diagnosis not present

## 2023-12-05 DIAGNOSIS — F17200 Nicotine dependence, unspecified, uncomplicated: Secondary | ICD-10-CM | POA: Diagnosis not present

## 2023-12-05 HISTORY — DX: Dysphonia: R49.0

## 2023-12-05 HISTORY — DX: Other fatigue: R53.83

## 2023-12-05 HISTORY — DX: Anxiety disorder, unspecified: F41.9

## 2023-12-05 HISTORY — PX: EXCISION NASAL MASS: SHX6271

## 2023-12-05 SURGERY — EXCISION, MASS, NOSE
Anesthesia: General | Site: Nose | Laterality: Left

## 2023-12-05 MED ORDER — MIDAZOLAM HCL 2 MG/2ML IJ SOLN
INTRAMUSCULAR | Status: AC
  Filled 2023-12-05: qty 2

## 2023-12-05 MED ORDER — LIDOCAINE-EPINEPHRINE 1 %-1:100000 IJ SOLN
INTRAMUSCULAR | Status: DC | PRN
  Administered 2023-12-05: 1 mL via INTRADERMAL

## 2023-12-05 MED ORDER — LACTATED RINGERS IV SOLN
INTRAVENOUS | Status: DC
Start: 2023-12-05 — End: 2023-12-05

## 2023-12-05 MED ORDER — PROPOFOL 1000 MG/100ML IV EMUL
INTRAVENOUS | Status: AC
Start: 2023-12-05 — End: 2023-12-05
  Filled 2023-12-05: qty 100

## 2023-12-05 MED ORDER — BACITRACIN ZINC 500 UNIT/GM EX OINT
TOPICAL_OINTMENT | CUTANEOUS | 0 refills | Status: AC
Start: 2023-12-05 — End: 2024-12-04

## 2023-12-05 MED ORDER — LIDOCAINE HCL (CARDIAC) PF 100 MG/5ML IV SOSY
PREFILLED_SYRINGE | INTRAVENOUS | Status: DC | PRN
  Administered 2023-12-05: 40 mg via INTRAVENOUS

## 2023-12-05 MED ORDER — SODIUM CHLORIDE 0.9 % IV SOLN: INTRAVENOUS | Status: DC | PRN

## 2023-12-05 MED ORDER — PROPOFOL 10 MG/ML IV BOLUS
INTRAVENOUS | Status: DC | PRN
  Administered 2023-12-05: 20 mg via INTRAVENOUS
  Administered 2023-12-05: 50 mg via INTRAVENOUS
  Administered 2023-12-05: 20 mg via INTRAVENOUS

## 2023-12-05 MED ORDER — FENTANYL CITRATE (PF) 100 MCG/2ML IJ SOLN
INTRAMUSCULAR | Status: DC | PRN
  Administered 2023-12-05: 25 ug via INTRAVENOUS

## 2023-12-05 MED ORDER — PROPOFOL 10 MG/ML IV BOLUS
INTRAVENOUS | Status: AC
Start: 2023-12-05 — End: 2023-12-05
  Filled 2023-12-05: qty 20

## 2023-12-05 MED ORDER — MIDAZOLAM HCL 2 MG/2ML IJ SOLN
INTRAMUSCULAR | Status: DC | PRN
  Administered 2023-12-05: 2 mg via INTRAVENOUS

## 2023-12-05 MED ORDER — LIDOCAINE HCL (PF) 2 % IJ SOLN
INTRAMUSCULAR | Status: AC
  Filled 2023-12-05: qty 5

## 2023-12-05 MED ORDER — FENTANYL CITRATE (PF) 100 MCG/2ML IJ SOLN
INTRAMUSCULAR | Status: AC
  Filled 2023-12-05: qty 2

## 2023-12-05 SURGICAL SUPPLY — 10 items
APPLICATOR COTTON TIP 3IN (MISCELLANEOUS) IMPLANT
CANISTER SUCT 1200ML W/VALVE (MISCELLANEOUS) ×1 IMPLANT
ELECTRODE REM PT RTRN 9FT ADLT (ELECTROSURGICAL) IMPLANT
GAUZE SPONGE 4X4 12PLY STRL (GAUZE/BANDAGES/DRESSINGS) IMPLANT
GLOVE BIO SURGEON STRL SZ7.5 (GLOVE) ×2 IMPLANT
GOWN STRL REUS W/ TWL LRG LVL3 (GOWN DISPOSABLE) IMPLANT
KIT TURNOVER KIT A (KITS) ×1 IMPLANT
NS IRRIG 500ML POUR BTL (IV SOLUTION) ×1 IMPLANT
PACK ENT CUSTOM (PACKS) IMPLANT
SUT CHROMIC 5 0 P 3 (SUTURE) IMPLANT

## 2023-12-05 NOTE — Op Note (Signed)
 12/05/2023  9:46 AM    Arzella Craven  [id number]   Pre-Op Dx: Neoplasm of uncertain behavior, nasal  Post-op Dx: SAME  Proc: Excision of left intranasal mass  Surg:  Chinita ONEIDA Hasten  Anes:  GOT  EBL: Less than 5 cc  Comp: None  Findings: Subcutaneous mass just inside the left nostril adjacent to the columella  Procedure: Nathanel was identified in the holding area taken the operating placed in supine position.  After general IV sedation the left side of the nose was prepped with a alcohol swab.  Just inside the columella on the left was a subcutaneous nodule.  Local anesthetic of 1% lidocaine  with 1 to 100,000 units of epinephrine  was used to inject along this area a total of 1 cc was used.  A 15 blade was used to then make an elliptical incision overlying the mass.  This was taken into the subcutaneous tissue.  Sharp sharp scissors were used to dissect the mass and remove it entirely.  With the mass was removed the incision was closed using interrupted 5-0 chromic.  With no active bleeding bacitracin ointment was applied to the area.  The patient was then awakened in the operating type cart in stable condition. Specimen: Intranasal mass left   Dispo:   Good  Plan: Discharge to home with topical antibiotic therapy follow-up 3 weeks  Chinita ONEIDA Hasten  12/05/2023 9:46 AM

## 2023-12-05 NOTE — Anesthesia Postprocedure Evaluation (Signed)
 Anesthesia Post Note  Patient: Debra Mccann  Procedure(s) Performed: EXCISION, MASS, NOSE (Left: Nose)  Patient location during evaluation: PACU Anesthesia Type: General Level of consciousness: awake and alert Pain management: pain level controlled Vital Signs Assessment: post-procedure vital signs reviewed and stable Respiratory status: spontaneous breathing, nonlabored ventilation, respiratory function stable and patient connected to nasal cannula oxygen Cardiovascular status: blood pressure returned to baseline and stable Postop Assessment: no apparent nausea or vomiting Anesthetic complications: no   No notable events documented.   Last Vitals:  Vitals:   12/05/23 1010 12/05/23 1015  BP:  134/65  Pulse: 71 68  Resp: 15 20  Temp:  (!) 36.1 C  SpO2: 99% 95%    Last Pain:  Vitals:   12/05/23 1015  TempSrc:   PainSc: 0-No pain                 Neal Oshea C Khamani Fairley

## 2023-12-05 NOTE — Transfer of Care (Signed)
 Immediate Anesthesia Transfer of Care Note  Patient: Debra Mccann  Procedure(s) Performed: EXCISION, MASS, NOSE (Left: Nose)  Patient Location: PACU  Anesthesia Type: General  Level of Consciousness: awake, alert  and patient cooperative  Airway and Oxygen Therapy: Patient Spontanous Breathing and Patient connected to supplemental oxygen  Post-op Assessment: Post-op Vital signs reviewed, Patient's Cardiovascular Status Stable, Respiratory Function Stable, Patent Airway and No signs of Nausea or vomiting  Post-op Vital Signs: Reviewed and stable  Complications: No notable events documented.

## 2023-12-05 NOTE — H&P (Signed)
 The patient's history has been reviewed, patient examined, no change in status, stable for surgery.  Questions were answered to the patients satisfaction.

## 2023-12-09 LAB — SURGICAL PATHOLOGY

## 2023-12-18 DIAGNOSIS — C44722 Squamous cell carcinoma of skin of right lower limb, including hip: Secondary | ICD-10-CM | POA: Diagnosis not present

## 2023-12-23 ENCOUNTER — Other Ambulatory Visit: Payer: Self-pay | Admitting: Cardiology

## 2023-12-23 DIAGNOSIS — Z1231 Encounter for screening mammogram for malignant neoplasm of breast: Secondary | ICD-10-CM

## 2023-12-31 ENCOUNTER — Other Ambulatory Visit: Payer: Self-pay | Admitting: Internal Medicine

## 2024-01-06 ENCOUNTER — Telehealth: Payer: Self-pay

## 2024-01-06 NOTE — Telephone Encounter (Signed)
 Mohs. Specimen tracking updated.

## 2024-01-13 ENCOUNTER — Ambulatory Visit: Admitting: Cardiology

## 2024-01-21 ENCOUNTER — Ambulatory Visit
Admission: RE | Admit: 2024-01-21 | Discharge: 2024-01-21 | Disposition: A | Discharge status: Home / Self Care | Source: Ambulatory Visit | Attending: Cardiology | Admitting: Cardiology

## 2024-01-21 DIAGNOSIS — Z1389 Encounter for screening for other disorder: Secondary | ICD-10-CM | POA: Diagnosis not present

## 2024-01-21 DIAGNOSIS — Z1339 Encounter for screening examination for other mental health and behavioral disorders: Secondary | ICD-10-CM | POA: Diagnosis not present

## 2024-01-21 DIAGNOSIS — Z1231 Encounter for screening mammogram for malignant neoplasm of breast: Secondary | ICD-10-CM | POA: Diagnosis not present

## 2024-01-21 DIAGNOSIS — E6609 Other obesity due to excess calories: Secondary | ICD-10-CM | POA: Diagnosis not present

## 2024-01-21 DIAGNOSIS — Z1383 Encounter for screening for respiratory disorder NEC: Secondary | ICD-10-CM | POA: Diagnosis not present

## 2024-01-21 DIAGNOSIS — J439 Emphysema, unspecified: Secondary | ICD-10-CM | POA: Diagnosis not present

## 2024-01-21 DIAGNOSIS — E66811 Obesity, class 1: Secondary | ICD-10-CM | POA: Diagnosis not present

## 2024-01-21 DIAGNOSIS — Z7689 Persons encountering health services in other specified circumstances: Secondary | ICD-10-CM | POA: Diagnosis not present

## 2024-01-21 DIAGNOSIS — F322 Major depressive disorder, single episode, severe without psychotic features: Secondary | ICD-10-CM | POA: Diagnosis not present

## 2024-01-21 DIAGNOSIS — F17210 Nicotine dependence, cigarettes, uncomplicated: Secondary | ICD-10-CM | POA: Diagnosis not present

## 2024-01-21 DIAGNOSIS — Z6832 Body mass index (BMI) 32.0-32.9, adult: Secondary | ICD-10-CM | POA: Diagnosis not present

## 2024-01-28 ENCOUNTER — Encounter: Payer: Self-pay | Admitting: Surgery

## 2024-03-08 ENCOUNTER — Telehealth: Payer: Self-pay

## 2024-03-08 NOTE — Telephone Encounter (Addendum)
 Specimen tracking and history updated. MOHs of left uperior lateral zygomatic and left neck completed 02/23/24. aw

## 2024-03-18 ENCOUNTER — Other Ambulatory Visit: Payer: Self-pay | Admitting: Family Medicine

## 2024-03-18 DIAGNOSIS — Z122 Encounter for screening for malignant neoplasm of respiratory organs: Secondary | ICD-10-CM

## 2024-03-18 DIAGNOSIS — F17200 Nicotine dependence, unspecified, uncomplicated: Secondary | ICD-10-CM

## 2024-03-18 DIAGNOSIS — Z13820 Encounter for screening for osteoporosis: Secondary | ICD-10-CM

## 2024-03-25 ENCOUNTER — Ambulatory Visit: Admitting: Dermatology

## 2024-03-30 ENCOUNTER — Ambulatory Visit

## 2024-04-14 ENCOUNTER — Other Ambulatory Visit

## 2024-04-27 ENCOUNTER — Ambulatory Visit

## 2024-10-05 ENCOUNTER — Encounter: Admitting: Dermatology
